# Patient Record
Sex: Male | Born: 1960 | Hispanic: No | Marital: Married | Smoking: Never smoker
Health system: Southern US, Community
[De-identification: ages and names within clinical notes are randomized; demographics above are authoritative.]

## PROBLEM LIST (undated history)

## (undated) DIAGNOSIS — G629 Polyneuropathy, unspecified: Secondary | ICD-10-CM

## (undated) DIAGNOSIS — M48061 Spinal stenosis, lumbar region without neurogenic claudication: Secondary | ICD-10-CM

## (undated) DIAGNOSIS — E119 Type 2 diabetes mellitus without complications: Secondary | ICD-10-CM

## (undated) DIAGNOSIS — K649 Unspecified hemorrhoids: Secondary | ICD-10-CM

## (undated) DIAGNOSIS — G459 Transient cerebral ischemic attack, unspecified: Secondary | ICD-10-CM

## (undated) DIAGNOSIS — I739 Peripheral vascular disease, unspecified: Secondary | ICD-10-CM

## (undated) DIAGNOSIS — B353 Tinea pedis: Secondary | ICD-10-CM

## (undated) DIAGNOSIS — I2510 Atherosclerotic heart disease of native coronary artery without angina pectoris: Secondary | ICD-10-CM

## (undated) DIAGNOSIS — D649 Anemia, unspecified: Secondary | ICD-10-CM

## (undated) DIAGNOSIS — E785 Hyperlipidemia, unspecified: Secondary | ICD-10-CM

## (undated) DIAGNOSIS — I10 Essential (primary) hypertension: Secondary | ICD-10-CM

## (undated) HISTORY — DX: Anemia, unspecified: D64.9

## (undated) HISTORY — DX: Unspecified hemorrhoids: K64.9

## (undated) HISTORY — DX: Hyperlipidemia, unspecified: E78.5

## (undated) HISTORY — DX: Type 2 diabetes mellitus without complications: E11.9

## (undated) HISTORY — DX: Essential (primary) hypertension: I10

## (undated) HISTORY — DX: Transient cerebral ischemic attack, unspecified: G45.9

## (undated) HISTORY — PX: EYE SURGERY: SHX253

## (undated) HISTORY — DX: Spinal stenosis, lumbar region without neurogenic claudication: M48.061

## (undated) HISTORY — DX: Peripheral vascular disease, unspecified: I73.9

## (undated) HISTORY — DX: Tinea pedis: B35.3

## (undated) HISTORY — PX: CARDIAC CATHETERIZATION: SHX172

## (undated) HISTORY — DX: Polyneuropathy, unspecified: G62.9

---

## 2004-07-12 ENCOUNTER — Emergency Department: Payer: Self-pay | Admitting: Emergency Medicine

## 2004-07-29 ENCOUNTER — Ambulatory Visit: Payer: Self-pay | Admitting: General Surgery

## 2005-07-27 ENCOUNTER — Ambulatory Visit (HOSPITAL_BASED_OUTPATIENT_CLINIC_OR_DEPARTMENT_OTHER): Admission: RE | Admit: 2005-07-27 | Discharge: 2005-07-27 | Payer: Self-pay | Admitting: Surgery

## 2005-07-27 ENCOUNTER — Ambulatory Visit (HOSPITAL_COMMUNITY): Admission: RE | Admit: 2005-07-27 | Discharge: 2005-07-27 | Payer: Self-pay | Admitting: Surgery

## 2005-10-11 HISTORY — PX: HERNIA REPAIR: SHX51

## 2008-04-03 DIAGNOSIS — E119 Type 2 diabetes mellitus without complications: Secondary | ICD-10-CM

## 2008-04-03 HISTORY — DX: Type 2 diabetes mellitus without complications: E11.9

## 2008-10-11 HISTORY — PX: OTHER SURGICAL HISTORY: SHX169

## 2009-01-17 DIAGNOSIS — M48061 Spinal stenosis, lumbar region without neurogenic claudication: Secondary | ICD-10-CM | POA: Insufficient documentation

## 2009-01-17 HISTORY — DX: Spinal stenosis, lumbar region without neurogenic claudication: M48.061

## 2013-01-16 DIAGNOSIS — I739 Peripheral vascular disease, unspecified: Secondary | ICD-10-CM | POA: Insufficient documentation

## 2013-01-16 HISTORY — DX: Peripheral vascular disease, unspecified: I73.9

## 2013-03-09 ENCOUNTER — Encounter: Payer: Self-pay | Admitting: Vascular Surgery

## 2013-03-12 ENCOUNTER — Other Ambulatory Visit: Payer: Self-pay | Admitting: *Deleted

## 2013-03-12 DIAGNOSIS — I739 Peripheral vascular disease, unspecified: Secondary | ICD-10-CM

## 2013-03-20 ENCOUNTER — Encounter: Payer: Self-pay | Admitting: Vascular Surgery

## 2013-04-16 ENCOUNTER — Encounter: Payer: Self-pay | Admitting: Vascular Surgery

## 2013-04-17 ENCOUNTER — Encounter: Payer: Self-pay | Admitting: Vascular Surgery

## 2013-04-17 ENCOUNTER — Ambulatory Visit (INDEPENDENT_AMBULATORY_CARE_PROVIDER_SITE_OTHER): Payer: Self-pay | Admitting: Vascular Surgery

## 2013-04-17 ENCOUNTER — Encounter (INDEPENDENT_AMBULATORY_CARE_PROVIDER_SITE_OTHER): Payer: Self-pay | Admitting: Vascular Surgery

## 2013-04-17 VITALS — BP 159/90 | HR 54 | Ht 64.0 in | Wt 217.7 lb

## 2013-04-17 DIAGNOSIS — M25569 Pain in unspecified knee: Secondary | ICD-10-CM

## 2013-04-17 DIAGNOSIS — I739 Peripheral vascular disease, unspecified: Secondary | ICD-10-CM

## 2013-04-17 NOTE — Progress Notes (Signed)
Subjective:     Patient ID: Shane Preston, male   DOB: [birth year], 52 y.o.   MRN: [id number]  HPI this 52 year old male was referred by Dr. Darreld Mclean for evaluation of vascular claudication in legs. Patient states for the past 6 months he has had weakness in his legs with walking. This happens after about one to 2 blocks. He does have numbness and tingling in his feet chronically he states. He has had back surgery in the past. He has no history of nonhealing ulcers infection gangrene or cellulitis. He is able to ambulate many miles if he stops and rests intermittently.  Past Medical History  Diagnosis Date  . Diabetes mellitus without complication   . Hypertension   . Hyperlipidemia   . Lumbar stenosis   . Peripheral vascular disease   . Hemorrhoids   . Tinea pedis     History  Substance Use Topics  . Smoking status: Former Smoker    Types: Cigarettes    Quit date: 04/17/1994  . Smokeless tobacco: Never Used  . Alcohol Use: No    No family history on file.  Allergies not on file  Current outpatient prescriptions:aspirin 81 MG tablet, Take 81 mg by mouth daily., Disp: , Rfl: ;  atorvastatin (LIPITOR) 20 MG tablet, Take 20 mg by mouth daily., Disp: , Rfl: ;  gabapentin (NEURONTIN) 300 MG capsule, Take 300 mg by mouth 3 (three) times daily., Disp: , Rfl: ;  glucose blood test strip, 2 each by Other route daily. Use as instructed, Disp: , Rfl:  hydrochlorothiazide (HYDRODIURIL) 25 MG tablet, Take 25 mg by mouth daily., Disp: , Rfl: ;  insulin glargine (LANTUS) 100 UNIT/ML injection, Inject 20 Units into the skin at bedtime. Titrate 2 units every 3 days until fasting morning glucose reading stays between 100 and 120, Disp: , Rfl: ;  metFORMIN (GLUCOPHAGE) 1000 MG tablet, Take 1,000 mg by mouth 2 (two) times daily with a meal., Disp: , Rfl:  nitroGLYCERIN (NITROSTAT) 0.4 MG SL tablet, Place 0.4 mg under the tongue every 5 (five) minutes as needed for chest pain., Disp: , Rfl: ;  quinapril  (ACCUPRIL) 20 MG tablet, Take 20 mg by mouth daily., Disp: , Rfl: ;  sitaGLIPtin (JANUVIA) 50 MG tablet, Take 50 mg by mouth daily., Disp: , Rfl: ;  terbinafine (LAMISIL) 250 MG tablet, Take 250 mg by mouth daily., Disp: , Rfl:   BP 159/90  Pulse 54  Ht 5\' 4"  (1.626 m)  Wt 217 lb 11.2 oz (98.748 kg)  BMI 37.35 kg/m2  SpO2 99%  Body mass index is 37.35 kg/(m^2).          Review of Systems patient complains of chest pain, leg pain with walking, pain free lying flat, weakness in the arms and legs, numbness in feet, blood in stool in the past. Hematuria in the past. All other systems negative complete review of systems    Objective:   Physical Exam blood pressure 159/90 heart rate 64 respirations 16 Gen.-alert and oriented x3 in no apparent distress HEENT normal for age Lungs no rhonchi or wheezing Cardiovascular regular rhythm no murmurs carotid pulses 3+ palpable no bruits audible Abdomen soft nontender no palpable masses Musculoskeletal free of  major deformities Skin clear -no rashes Neurologic normal Lower extremities 3+ femoral and dorsalis pedis pulses palpable bilaterally with no edema  Data ordered lower extremity arterial Doppler show reviewed and interpreted. They're totally normal with triphasic flow bilaterally and ABIs 1.0 or greater.  Assessment:     Bilateral calf discomfort with ambulation possible neurogenic claudication due to lumbar stenosis No evidence of arterial insufficiency    Plan:     Patient may need referral to neurosurgeon to see if spinal stenosis is causing his lower extremity discomfort with ambulation. No need for any further vascular evaluation or treatment

## 2014-01-10 ENCOUNTER — Observation Stay: Payer: Self-pay | Admitting: Internal Medicine

## 2014-01-10 DIAGNOSIS — I10 Essential (primary) hypertension: Secondary | ICD-10-CM

## 2014-01-10 DIAGNOSIS — E785 Hyperlipidemia, unspecified: Secondary | ICD-10-CM

## 2014-01-10 DIAGNOSIS — R079 Chest pain, unspecified: Secondary | ICD-10-CM

## 2014-01-10 LAB — CBC
HCT: 39.9 % — AB (ref 40.0–52.0)
HGB: 13.3 g/dL (ref 13.0–18.0)
MCH: 28.6 pg (ref 26.0–34.0)
MCHC: 33.3 g/dL (ref 32.0–36.0)
MCV: 86 fL (ref 80–100)
PLATELETS: 328 10*3/uL (ref 150–440)
RBC: 4.65 10*6/uL (ref 4.40–5.90)
RDW: 12.8 % (ref 11.5–14.5)
WBC: 8.1 10*3/uL (ref 3.8–10.6)

## 2014-01-10 LAB — COMPREHENSIVE METABOLIC PANEL
ALBUMIN: 3.7 g/dL (ref 3.4–5.0)
ALK PHOS: 98 U/L
ANION GAP: 5 — AB (ref 7–16)
AST: 27 U/L (ref 15–37)
BUN: 9 mg/dL (ref 7–18)
Bilirubin,Total: 0.4 mg/dL (ref 0.2–1.0)
CALCIUM: 8.5 mg/dL (ref 8.5–10.1)
CHLORIDE: 101 mmol/L (ref 98–107)
Co2: 28 mmol/L (ref 21–32)
Creatinine: 0.82 mg/dL (ref 0.60–1.30)
EGFR (Non-African Amer.): >60
Glucose: 161 mg/dL — ABNORMAL HIGH (ref 65–99)
OSMOLALITY: 270 (ref 275–301)
POTASSIUM: 3.5 mmol/L (ref 3.5–5.1)
SGPT (ALT): 42 U/L (ref 12–78)
Sodium: 134 mmol/L — ABNORMAL LOW (ref 136–145)
Total Protein: 7.9 g/dL (ref 6.4–8.2)

## 2014-01-10 LAB — TROPONIN I
Troponin-I: <0.02 ng/mL
Troponin-I: <0.02 ng/mL
Troponin-I: <0.02 ng/mL

## 2014-01-31 ENCOUNTER — Telehealth: Payer: Self-pay

## 2014-01-31 NOTE — Telephone Encounter (Signed)
Opened in error

## 2014-02-06 ENCOUNTER — Ambulatory Visit (INDEPENDENT_AMBULATORY_CARE_PROVIDER_SITE_OTHER): Payer: PRIVATE HEALTH INSURANCE | Admitting: Cardiology

## 2014-02-06 ENCOUNTER — Encounter: Payer: Self-pay | Admitting: Cardiology

## 2014-02-06 ENCOUNTER — Telehealth: Payer: Self-pay

## 2014-02-06 VITALS — BP 148/98 | HR 59 | Ht 64.0 in | Wt 222.5 lb

## 2014-02-06 DIAGNOSIS — Z794 Long term (current) use of insulin: Secondary | ICD-10-CM

## 2014-02-06 DIAGNOSIS — IMO0001 Reserved for inherently not codable concepts without codable children: Secondary | ICD-10-CM

## 2014-02-06 DIAGNOSIS — E669 Obesity, unspecified: Secondary | ICD-10-CM | POA: Insufficient documentation

## 2014-02-06 DIAGNOSIS — Z0389 Encounter for observation for other suspected diseases and conditions ruled out: Secondary | ICD-10-CM

## 2014-02-06 DIAGNOSIS — R079 Chest pain, unspecified: Secondary | ICD-10-CM

## 2014-02-06 DIAGNOSIS — E119 Type 2 diabetes mellitus without complications: Secondary | ICD-10-CM

## 2014-02-06 DIAGNOSIS — I10 Essential (primary) hypertension: Secondary | ICD-10-CM

## 2014-02-06 HISTORY — DX: Reserved for inherently not codable concepts without codable children: IMO0001

## 2014-02-06 NOTE — Assessment & Plan Note (Signed)
Atypical pain

## 2014-02-06 NOTE — Progress Notes (Signed)
02/06/2014 Shane Preston   October 13, 1960  [id number]  Primary Physicia Leanna SatoMILES,LINDA M, MD Primary Cardiologist: Dr Mariah MillingGollan  HPI:  Shane Shane BaldJacobo is a 53 y/o diabetic male originally from [place] .He has a history of [facility] in Feb [facility] which was completley normal. [facility] with chest pain 4/2-01/11/14. He underwent Lexiscan Myoview which was negative for ischemia. He was put on a PPI, Imdur, and Metropolol. He is here now for follow up. nt thoue continues to complain of very localized, "sharp like a knife" Lt chest pain. It seems fairly constant though he admits NTG occasionally relieves his symptoms. In general he says he feels no different with his new medications.    Current Outpatient Prescriptions  Medication Sig Dispense Refill  . aspirin 81 MG tablet Take 81 mg by mouth daily.      . CRESTOR 10 MG tablet Take 10 mg by mouth daily.       Marland Kitchen. gabapentin (NEURONTIN) 300 MG capsule Take 300 mg by mouth 3 (three) times daily.      Marland Kitchen. glucose blood test strip 2 each by Other route daily. Use as instructed      . insulin glargine (LANTUS) 100 UNIT/ML injection Inject 30 Units into the skin at bedtime. Titrate 2 units every 3 days until fasting morning glucose reading stays between 100 and 120      . isosorbide mononitrate (IMDUR) 30 MG 24 hr tablet Take 30 mg by mouth daily.       Marland Kitchen. lisinopril-hydrochlorothiazide (PRINZIDE,ZESTORETIC) 20-25 MG per tablet Take 1 tablet by mouth daily.       . metFORMIN (GLUCOPHAGE) 1000 MG tablet Take 1,000 mg by mouth 2 (two) times daily with a meal.      . metoprolol succinate (TOPROL-XL) 25 MG 24 hr tablet Take 25 mg by mouth daily.       . nitroGLYCERIN (NITROSTAT) 0.4 MG SL tablet Place 0.4 mg under the tongue every 5 (five) minutes as needed for chest pain.      . pantoprazole (PROTONIX) 40 MG tablet Take 40 mg by mouth daily.       . sitaGLIPtin (JANUVIA) 50 MG tablet Take 50 mg by mouth daily.       No current  facility-administered medications for this visit.    No Known Allergies  History   Social History  . Marital Status: Married    Spouse Name: N/A    Number of Children: N/A  . Years of Education: N/A   Occupational History  . Not on file.   Social History Main Topics  . Smoking status: Former Smoker -- 0.25 packs/day for 4 years    Types: Cigarettes    Quit date: 04/17/1994  . Smokeless tobacco: Never Used  . Alcohol Use: No  . Drug Use: No  . Sexual Activity: Not on file   Other Topics Concern  . Not on file   Social History Narrative  . No narrative on file     Review of Systems: General: negative for chills, fever, night sweats or weight changes.  Cardiovascular: negative for chest pain, dyspnea on exertion, edema, orthopnea, palpitations, paroxysmal nocturnal dyspnea or shortness of breath Dermatological: negative for rash Respiratory: negative for cough or wheezing Urologic: negative for hematuria Abdominal: negative for nausea, vomiting, diarrhea, bright red blood per rectum, melena, or hematemesis Neurologic: negative for visual changes, syncope, or dizziness All other systems reviewed and are otherwise negative except as noted  above.    Blood pressure 148/98, pulse 59, height 5\' 4"  (1.626 m), weight 222 lb 8 oz (100.925 kg).  General appearance: alert, cooperative, no distress and moderately obese Neck: no carotid bruit and no JVD Lungs: clear to auscultation bilaterally Heart: regular rate and rhythm  EKG NSR, SB  ASSESSMENT AND PLAN:   Chest pain- hx of Nl cors 2013, negative Myoview 4/15 Atypical pain  Normal coronary arteries- Feb 2012 These records are available in "Care Everywhere" through EPIC  HTN (hypertension) .  IDDM (insulin dependent diabetes mellitus) .    PLAN  I reviewed Shane Preston's cath with Dr Mariah MillingGollan. His coronaries were totally normal in Feb 2012. His recent Myoview was negative for ischemia. He does have risk factors for  CAD but the chest pain he describes to me is atypical for angina. We plan no further cardiac work up at this time. We will refer him back to his primary care provider. We will be glad to see him again as needed.   Eda PaschalLuke K [facility] CenterKilroyPA-C 02/06/2014 3:35 PM

## 2014-02-06 NOTE — Assessment & Plan Note (Signed)
These records are available [facility]

## 2014-02-06 NOTE — Patient Instructions (Signed)
Continue your current medications, office will call you with follow up instructions.

## 2014-02-06 NOTE — Telephone Encounter (Signed)
l mom to schedule f/u appt 

## 2014-02-08 ENCOUNTER — Telehealth: Payer: Self-pay | Admitting: *Deleted

## 2014-02-08 NOTE — Telephone Encounter (Signed)
Called patient to inform him that per Corine ShelterLuke Kilroy PA "After reviewing his cardiac cath and stress test, his pain does not appear to be cardiac. Please follow up with PCP." Patient verbalized understanding

## 2014-07-12 DIAGNOSIS — Z1211 Encounter for screening for malignant neoplasm of colon: Secondary | ICD-10-CM | POA: Insufficient documentation

## 2014-07-12 HISTORY — DX: Encounter for screening for malignant neoplasm of colon: Z12.11

## 2014-11-23 ENCOUNTER — Emergency Department (HOSPITAL_COMMUNITY)
Admission: EM | Admit: 2014-11-23 | Discharge: 2014-11-23 | Disposition: A | Discharge status: Home / Self Care | Payer: Commercial Managed Care - HMO | Attending: Emergency Medicine | Admitting: Emergency Medicine

## 2014-11-23 DIAGNOSIS — Z8673 Personal history of transient ischemic attack (TIA), and cerebral infarction without residual deficits: Secondary | ICD-10-CM | POA: Insufficient documentation

## 2014-11-23 DIAGNOSIS — Z7982 Long term (current) use of aspirin: Secondary | ICD-10-CM | POA: Diagnosis not present

## 2014-11-23 DIAGNOSIS — Z9889 Other specified postprocedural states: Secondary | ICD-10-CM | POA: Diagnosis not present

## 2014-11-23 DIAGNOSIS — Z79899 Other long term (current) drug therapy: Secondary | ICD-10-CM | POA: Diagnosis not present

## 2014-11-23 DIAGNOSIS — M25522 Pain in left elbow: Secondary | ICD-10-CM | POA: Diagnosis present

## 2014-11-23 DIAGNOSIS — Z8739 Personal history of other diseases of the musculoskeletal system and connective tissue: Secondary | ICD-10-CM | POA: Insufficient documentation

## 2014-11-23 DIAGNOSIS — E1165 Type 2 diabetes mellitus with hyperglycemia: Secondary | ICD-10-CM | POA: Diagnosis not present

## 2014-11-23 DIAGNOSIS — E785 Hyperlipidemia, unspecified: Secondary | ICD-10-CM | POA: Insufficient documentation

## 2014-11-23 DIAGNOSIS — R739 Hyperglycemia, unspecified: Secondary | ICD-10-CM

## 2014-11-23 DIAGNOSIS — Z794 Long term (current) use of insulin: Secondary | ICD-10-CM | POA: Insufficient documentation

## 2014-11-23 DIAGNOSIS — Z8619 Personal history of other infectious and parasitic diseases: Secondary | ICD-10-CM | POA: Insufficient documentation

## 2014-11-23 DIAGNOSIS — G629 Polyneuropathy, unspecified: Secondary | ICD-10-CM | POA: Diagnosis not present

## 2014-11-23 DIAGNOSIS — Z87891 Personal history of nicotine dependence: Secondary | ICD-10-CM | POA: Diagnosis not present

## 2014-11-23 DIAGNOSIS — I10 Essential (primary) hypertension: Secondary | ICD-10-CM | POA: Insufficient documentation

## 2014-11-23 DIAGNOSIS — Y9389 Activity, other specified: Secondary | ICD-10-CM | POA: Diagnosis not present

## 2014-11-23 DIAGNOSIS — M7022 Olecranon bursitis, left elbow: Secondary | ICD-10-CM | POA: Diagnosis not present

## 2014-11-23 DIAGNOSIS — Z8719 Personal history of other diseases of the digestive system: Secondary | ICD-10-CM | POA: Diagnosis not present

## 2014-11-23 LAB — URINALYSIS, ROUTINE W REFLEX MICROSCOPIC
BILIRUBIN URINE: NEGATIVE
HGB URINE DIPSTICK: NEGATIVE
KETONES UR: NEGATIVE mg/dL
LEUKOCYTES UA: NEGATIVE
Nitrite: NEGATIVE
Protein, ur: NEGATIVE mg/dL
SPECIFIC GRAVITY, URINE: 1.03 (ref 1.005–1.030)
Urobilinogen, UA: 0.2 mg/dL (ref 0.0–1.0)
pH: 6.5 (ref 5.0–8.0)

## 2014-11-23 LAB — CBC WITH DIFFERENTIAL/PLATELET
BASOS PCT: 0 % (ref 0–1)
Basophils Absolute: 0 10*3/uL (ref 0.0–0.1)
EOS PCT: 1 % (ref 0–5)
Eosinophils Absolute: 0.1 10*3/uL (ref 0.0–0.7)
HCT: 40.8 % (ref 39.0–52.0)
HEMOGLOBIN: 13.5 g/dL (ref 13.0–17.0)
LYMPHS PCT: 20 % (ref 12–46)
Lymphs Abs: 1.5 10*3/uL (ref 0.7–4.0)
MCH: 28.2 pg (ref 26.0–34.0)
MCHC: 33.1 g/dL (ref 30.0–36.0)
MCV: 85.2 fL (ref 78.0–100.0)
Monocytes Absolute: 0.4 10*3/uL (ref 0.1–1.0)
Monocytes Relative: 6 % (ref 3–12)
NEUTROS ABS: 5.7 10*3/uL (ref 1.7–7.7)
NEUTROS PCT: 73 % (ref 43–77)
Platelets: 382 10*3/uL (ref 150–400)
RBC: 4.79 MIL/uL (ref 4.22–5.81)
RDW: 12.5 % (ref 11.5–15.5)
WBC: 7.7 10*3/uL (ref 4.0–10.5)

## 2014-11-23 LAB — CBG MONITORING, ED
GLUCOSE-CAPILLARY: 512 mg/dL — AB (ref 70–99)
Glucose-Capillary: 598 mg/dL (ref 70–99)
Glucose-Capillary: 581 mg/dL (ref 70–99)
Glucose-Capillary: 265 mg/dL — ABNORMAL HIGH (ref 70–99)

## 2014-11-23 LAB — I-STAT CHEM 8, ED
BUN: 17 mg/dL (ref 6–23)
CREATININE: 1 mg/dL (ref 0.50–1.35)
Calcium, Ion: 1.19 mmol/L (ref 1.12–1.23)
Chloride: 91 mmol/L — ABNORMAL LOW (ref 96–112)
HCT: 45 % (ref 39.0–52.0)
HEMOGLOBIN: 15.3 g/dL (ref 13.0–17.0)
Potassium: 4.9 mmol/L (ref 3.5–5.1)
SODIUM: 131 mmol/L — AB (ref 135–145)
TCO2: 27 mmol/L (ref 0–100)

## 2014-11-23 LAB — BASIC METABOLIC PANEL
Anion gap: 9 (ref 5–15)
BUN: 11 mg/dL (ref 6–23)
CO2: 28 mmol/L (ref 19–32)
CREATININE: 1.19 mg/dL (ref 0.50–1.35)
Calcium: 9.4 mg/dL (ref 8.4–10.5)
Chloride: 93 mmol/L — ABNORMAL LOW (ref 96–112)
GFR calc Af Amer: 78 mL/min — ABNORMAL LOW (ref >90)
GFR calc non Af Amer: 68 mL/min — ABNORMAL LOW (ref >90)
Glucose, Bld: 684 mg/dL (ref 70–99)
Potassium: 5.2 mmol/L — ABNORMAL HIGH (ref 3.5–5.1)
Sodium: 130 mmol/L — ABNORMAL LOW (ref 135–145)

## 2014-11-23 LAB — URINE MICROSCOPIC-ADD ON

## 2014-11-23 LAB — MAGNESIUM: MAGNESIUM: 2 mg/dL (ref 1.5–2.5)

## 2014-11-23 MED ORDER — INSULIN REGULAR BOLUS VIA INFUSION
0.0000 [IU] | Freq: Three times a day (TID) | INTRAVENOUS | Status: DC
  Filled 2014-11-23: qty 10

## 2014-11-23 MED ORDER — SODIUM CHLORIDE 0.9 % IV BOLUS (SEPSIS)
1000.0000 mL | Freq: Once | INTRAVENOUS | Status: AC
  Administered 2014-11-23: 1000 mL via INTRAVENOUS

## 2014-11-23 MED ORDER — SODIUM CHLORIDE 0.9 % IV SOLN: INTRAVENOUS | Status: DC

## 2014-11-23 MED ORDER — INSULIN ASPART 100 UNIT/ML ~~LOC~~ SOLN
10.0000 [IU] | Freq: Once | SUBCUTANEOUS | Status: AC
  Administered 2014-11-23: 10 [IU] via SUBCUTANEOUS
  Filled 2014-11-23: qty 1

## 2014-11-23 MED ORDER — INSULIN DETEMIR 100 UNIT/ML ~~LOC~~ SOLN
30.0000 [IU] | Freq: Once | SUBCUTANEOUS | Status: AC
  Administered 2014-11-23: 30 [IU] via SUBCUTANEOUS
  Filled 2014-11-23 (×2): qty 0.3

## 2014-11-23 MED ORDER — DEXTROSE 50 % IV SOLN: 25.0000 mL | INTRAVENOUS | Status: DC | PRN

## 2014-11-23 MED ORDER — DEXTROSE-NACL 5-0.45 % IV SOLN: INTRAVENOUS | Status: DC

## 2014-11-23 MED ORDER — HYDROCODONE-ACETAMINOPHEN 5-325 MG PO TABS
1.0000 | ORAL_TABLET | Freq: Once | ORAL | Status: AC
  Administered 2014-11-23: 1 via ORAL
  Filled 2014-11-23: qty 1

## 2014-11-23 MED ORDER — KETOROLAC TROMETHAMINE 30 MG/ML IJ SOLN
30.0000 mg | Freq: Once | INTRAMUSCULAR | Status: AC
  Administered 2014-11-23: 30 mg via INTRAVENOUS
  Filled 2014-11-23: qty 1

## 2014-11-23 MED ORDER — SODIUM CHLORIDE 0.9 % IV SOLN
INTRAVENOUS | Status: DC
  Filled 2014-11-23: qty 2.5

## 2014-11-23 MED ORDER — NAPROXEN 500 MG PO TABS: 500.0000 mg | ORAL_TABLET | Freq: Two times a day (BID) | ORAL | Status: DC

## 2014-11-23 MED ORDER — INSULIN ASPART 100 UNIT/ML ~~LOC~~ SOLN
8.0000 [IU] | Freq: Once | SUBCUTANEOUS | Status: AC
  Administered 2014-11-23: 8 [IU] via SUBCUTANEOUS
  Filled 2014-11-23: qty 1

## 2014-11-23 NOTE — ED Notes (Signed)
BS 581.  Will notify nurse when able to find them.

## 2014-11-23 NOTE — ED Provider Notes (Signed)
Pt visit shared.  Pt here with two weeks of left elbow swelling.  Exam c/w bursitis.  No evidence of acute infectious process.  CBG markedly elevated.  Pt takes metformin 1000 mgbid, levemir 30 units daily at 430pm, januvia once daily at noon. No missed medication doses.     Tilden FossaElizabeth Madelena Maturin, MD 11/23/14 [phone]

## 2014-11-23 NOTE — ED Notes (Signed)
CBG 598 

## 2014-11-23 NOTE — Discharge Instructions (Signed)
Bursitis Bursitis is a swelling and soreness (inflammation) of a fluid-filled sac (bursa) that overlies and protects a joint. It can be caused by injury, overuse of the joint, arthritis or infection. The joints most likely to be affected are the elbows, shoulders, hips and knees. HOME CARE INSTRUCTIONS   Apply ice to the affected area for 15-20 minutes each hour while awake for 2 days. Put the ice in a plastic bag and place a towel between the bag of ice and your skin.  Rest the injured joint as much as possible, but continue to put the joint through a full range of motion, 4 times per day. (The shoulder joint especially becomes rapidly "frozen" if not used.) When the pain lessens, begin normal slow movements and usual activities.  Only take over-the-counter or prescription medicines for pain, discomfort or fever as directed by your caregiver.  Your caregiver may recommend draining the bursa and injecting medicine into the bursa. This may help the healing process.  Follow all instructions for follow-up with your caregiver. This includes any orthopedic referrals, physical therapy and rehabilitation. Any delay in obtaining necessary care could result in a delay or failure of the bursitis to heal and chronic pain. SEEK IMMEDIATE MEDICAL CARE IF:   Your pain increases even during treatment.  You develop an oral temperature above 102 F (38.9 C) and have heat and inflammation over the involved bursa. MAKE SURE YOU:   Understand these instructions.  Will watch your condition.  Will get help right away if you are not doing well or get worse. Document Released: 09/24/2000 Document Revised: 12/20/2011 Document Reviewed: 12/17/2013 [facility] Patient Information 2015 [place]. This information is not intended to replace advice given to you by your health care provider. Make sure you discuss any questions you have with your health care provider.  Hyperglycemia Hyperglycemia occurs when the  glucose (sugar) in your blood is too high. Hyperglycemia can happen for many reasons, but it most often happens to people who do not know they have diabetes or are not managing their diabetes properly.  CAUSES  Whether you have diabetes or not, there are other causes of hyperglycemia. Hyperglycemia can occur when you have diabetes, but it can also occur in other situations that you might not be as aware of, such as: Diabetes  If you have diabetes and are having problems controlling your blood glucose, hyperglycemia could occur because of some of the following reasons:  Not following your meal plan.  Not taking your diabetes medications or not taking it properly.  Exercising less or doing less activity than you normally do.  Being sick. Pre-diabetes  This cannot be ignored. Before people develop Type 2 diabetes, they almost always have "pre-diabetes." This is when your blood glucose levels are higher than normal, but not yet high enough to be diagnosed as diabetes. Research has shown that some long-term damage to the body, especially the heart and circulatory system, may already be occurring during pre-diabetes. If you take action to manage your blood glucose when you have pre-diabetes, you may delay or prevent Type 2 diabetes from developing. Stress  If you have diabetes, you may be "diet" controlled or on oral medications or insulin to control your diabetes. However, you may find that your blood glucose is higher than usual in the hospital whether you have diabetes or not. This is often referred to as "stress hyperglycemia." Stress can elevate your blood glucose. This happens because of hormones put out by the body during  times of stress. If stress has been the cause of your high blood glucose, it can be followed regularly by your caregiver. That way he/she can make sure your hyperglycemia does not continue to get worse or progress to diabetes. Steroids  Steroids are medications that act on  the infection fighting system (immune system) to block inflammation or infection. One side effect can be a rise in blood glucose. Most people can produce enough extra insulin to allow for this rise, but for those who cannot, steroids make blood glucose levels go even higher. It is not unusual for steroid treatments to "uncover" diabetes that is developing. It is not always possible to determine if the hyperglycemia will go away after the steroids are stopped. A special blood test called an A1c is sometimes done to determine if your blood glucose was elevated before the steroids were started. SYMPTOMS  Thirsty.  Frequent urination.  Dry mouth.  Blurred vision.  Tired or fatigue.  Weakness.  Sleepy.  Tingling in feet or leg. DIAGNOSIS  Diagnosis is made by monitoring blood glucose in one or all of the following ways:  A1c test. This is a chemical found in your blood.  Fingerstick blood glucose monitoring.  Laboratory results. TREATMENT  First, knowing the cause of the hyperglycemia is important before the hyperglycemia can be treated. Treatment may include, but is not be limited to:  Education.  Change or adjustment in medications.  Change or adjustment in meal plan.  Treatment for an illness, infection, etc.  More frequent blood glucose monitoring.  Change in exercise plan.  Decreasing or stopping steroids.  Lifestyle changes. HOME CARE INSTRUCTIONS   Test your blood glucose as directed.  Exercise regularly. Your caregiver will give you instructions about exercise. Pre-diabetes or diabetes which comes on with stress is helped by exercising.  Eat wholesome, balanced meals. Eat often and at regular, fixed times. Your caregiver or nutritionist will give you a meal plan to guide your sugar intake.  Being at an ideal weight is important. If needed, losing as little as 10 to 15 pounds may help improve blood glucose levels. SEEK MEDICAL CARE IF:   You have questions  about medicine, activity, or diet.  You continue to have symptoms (problems such as increased thirst, urination, or weight gain). SEEK IMMEDIATE MEDICAL CARE IF:   You are vomiting or have diarrhea.  Your breath smells fruity.  You are breathing faster or slower.  You are very sleepy or incoherent.  You have numbness, tingling, or pain in your feet or hands.  You have chest pain.  Your symptoms get worse even though you have been following your caregiver's orders.  If you have any other questions or concerns. Document Released: 03/23/2001 Document Revised: 12/20/2011 Document Reviewed: 01/24/2012 [facility] Patient Information 2015 [place], [place]. This information is not intended to replace advice given to you by your health care provider. Make sure you discuss any questions you have with your health care provider.

## 2014-11-23 NOTE — ED Notes (Signed)
Seth BakeEmily West-PA notified of elevated glucose on chem-8

## 2014-11-23 NOTE — ED Notes (Signed)
Lt. Elbow swelling that came out of no where two weeks ago,

## 2014-11-23 NOTE — ED Notes (Signed)
  CBG 265  

## 2014-11-23 NOTE — ED Provider Notes (Signed)
CSN: [id number]     Arrival date & time 11/23/14  1340 History  This chart was scribed for non-physician practitioner, Trixie Dredge, PA-C working with Juliet Rude. Rubin Payor, MD by Freida Busman, ED Scribe. This patient was seen in room TR07C/TR07C and the patient's care was started at 2:56 PM.    Chief Complaint  Patient presents with  . Elbow Pain    The history is provided by the patient. No language interpreter was used.     HPI Comments:  Shane Preston is a 54 y.o. male with a h/o DM who presents to the Emergency Department complaining of moderate pain to his right elbow that started 2 weeks ago. His pain has progressively worsened since onset. He reports increased warmth to the area and states pain intermittently shoots down in to the forearm. He denies injury/fall and decreased sensation to the extremity, fever, chills, body aches and abdominal pain. He is right hand dominant. No alleviating factors noted. Pt is complaint with is DM meds but reports high BS yesterday, in the 300s.     Past Medical History  Diagnosis Date  . Diabetes mellitus without complication   . Hypertension   . Hyperlipidemia   . Lumbar stenosis   . Peripheral vascular disease   . Hemorrhoids   . Tinea pedis   . Neuropathy   . TIA (transient ischemic attack)    Past Surgical History  Procedure Laterality Date  . Hernia repair  2007  . Lumbar decompression surgery  2010  . Cardiac catheterization      [facility]    Family History  Problem Relation Age of Onset  . Family history unknown: Yes   History  Substance Use Topics  . Smoking status: Former Smoker -- 0.25 packs/day for 4 years    Types: Cigarettes    Quit date: 04/17/1994  . Smokeless tobacco: Never Used  . Alcohol Use: No    Review of Systems  Constitutional: Negative for fever and chills.  Gastrointestinal: Negative for abdominal pain.  Musculoskeletal: Positive for myalgias and arthralgias.  Neurological: Negative for  numbness.  All other systems reviewed and are negative.     Allergies  Review of patient's allergies indicates no known allergies.  Home Medications   Prior to Admission medications   Medication Sig Start Date End Date Taking? Authorizing Provider  aspirin 81 MG tablet Take 81 mg by mouth daily.    Historical Provider, MD  CRESTOR 10 MG tablet Take 10 mg by mouth daily.  01/10/14   Historical Provider, MD  gabapentin (NEURONTIN) 300 MG capsule Take 300 mg by mouth 3 (three) times daily.    Historical Provider, MD  glucose blood test strip 2 each by Other route daily. Use as instructed    Historical Provider, MD  insulin glargine (LANTUS) 100 UNIT/ML injection Inject 30 Units into the skin at bedtime. Titrate 2 units every 3 days until fasting morning glucose reading stays between 100 and 120    Historical Provider, MD  isosorbide mononitrate (IMDUR) 30 MG 24 hr tablet Take 30 mg by mouth daily.  01/14/14   Historical Provider, MD  lisinopril-hydrochlorothiazide (PRINZIDE,ZESTORETIC) 20-25 MG per tablet Take 1 tablet by mouth daily.  01/10/14   Historical Provider, MD  metFORMIN (GLUCOPHAGE) 1000 MG tablet Take 1,000 mg by mouth 2 (two) times daily with a meal.    Historical Provider, MD  metoprolol succinate (TOPROL-XL) 25 MG 24 hr tablet Take 25 mg by mouth daily.  01/14/14  Historical Provider, MD  nitroGLYCERIN (NITROSTAT) 0.4 MG SL tablet Place 0.4 mg under the tongue every 5 (five) minutes as needed for chest pain.    Historical Provider, MD  pantoprazole (PROTONIX) 40 MG tablet Take 40 mg by mouth daily.  01/14/14   Historical Provider, MD  sitaGLIPtin (JANUVIA) 50 MG tablet Take 50 mg by mouth daily.    Historical Provider, MD   BP 150/94 mmHg  Pulse 83  Temp(Src) 97.7 F (36.5 C) (Oral)  Resp 16  SpO2 96% Physical Exam  Constitutional: He appears well-developed and well-nourished. No distress.  HENT:  Head: Normocephalic and atraumatic.  Neck: Neck supple.  Cardiovascular: Normal  rate and regular rhythm.   Pulmonary/Chest: Effort normal and breath sounds normal. No respiratory distress. He has no wheezes. He has no rales.  Abdominal: Soft. He exhibits no distension and no mass. There is no tenderness. There is no rebound and no guarding.  Musculoskeletal:  Tenderness and fullness of left olecranon bursa with mild warmth.  No erythema or fluctuance.  Full AROM of left elbow without pain.  Distal pulses and sensation intact.  No edema of left arm.    Neurological: He is alert. He exhibits normal muscle tone.  Skin: He is not diaphoretic.  Nursing note and vitals reviewed.   ED Course  Procedures   DIAGNOSTIC STUDIES:  Oxygen Saturation is 96% on RA, normal by my interpretation.    COORDINATION OF CARE:  3:21 PM Will order CBG, antibiotics and pain meds. Discussed treatment plan with pt at bedside and pt agreed to plan.  Labs Review Labs Reviewed  CBG MONITORING, ED - Abnormal; Notable for the following:    Glucose-Capillary 598 (*)    All other components within normal limits  I-STAT CHEM 8, ED - Abnormal; Notable for the following:    Sodium 131 (*)    Chloride 91 (*)    Glucose, Bld >700 (*)    All other components within normal limits  CBC WITH DIFFERENTIAL/PLATELET  URINALYSIS, ROUTINE W REFLEX MICROSCOPIC  BASIC METABOLIC PANEL  MAGNESIUM    Imaging Review No results found.   EKG Interpretation None      MDM   Final diagnoses:  Olecranon bursitis of left elbow  Hyperglycemia    Afebrile nontoxic patient with left olecranon bursitis.  No fevers or systemic symptoms.  No injury.  Ongoing x 2 weeks.  Plan for pain medications and orthopedic follow up.  CBG found to be 598.  Pt in fast track, plan to move to main ED for further evaluation and management of hyperglycemia.  Labs, UA, IVF, glucose stabilizer ordered.  Signed out to Dr Madilyn Hookees.    I personally performed the services described in this documentation, which was scribed in my  presence. The recorded information has been reviewed and is accurate.    Trixie Dredgemily Xylina Rhoads, PA-C 11/23/14 1616  Tilden FossaElizabeth Rees, MD 11/23/14 [phone]

## 2015-02-01 NOTE — H&P (Signed)
PATIENT NAME:  Shane Preston, Shane Preston MR#:  [id number] DATE OF BIRTH:  11-Mar-1961  DATE OF ADMISSION:  01/10/2014  PRIMARY CARE PROVIDER: [facility].   CHIEF COMPLAINT: Left-sided chest pain.   HISTORY OF PRESENT ILLNESS: A 54 year old male patient with history of hypertension, diabetes and remote tobacco abuse, presents to the Emergency Room sent in from primary care physician's office after he complained of worsening chest pain for the last 4 days. He mentioned that his chest pain is located in the left precordial area. It can happen at rest or with exertion. He has had this similar pain for multiple years, had a cardiac catheterization 2 years back for the same pain, which is normal per patient. Records not available at this time. This pain has worsened over the last 4 days and tends to improve with nitroglycerin. There is no relation to food and is not pleuritic. He does not have a cough. No shortness of breath, palpitations, or lightheadedness. He does have diaphoresis on and off. No recent change in medications. He is on aspirin, statin and also, his blood pressure has been elevated today at his primary care physician's office and also in the Emergency Room up to 174/100 on arrival to the ER.   PAST MEDICAL HISTORY:  1.  Insulin-dependent diabetes mellitus.  2.  Hypertension.  3.  Remote tobacco abuse.  4.  Neuropathy.   FAMILY HISTORY: No premature CAD in the family.   SOCIAL HISTORY: The patient has retired from work. He smoked in the past, but quit decades back. Quit alcohol. No illicit drugs. Lives at home with his wife.   CODE STATUS: Full code.   REVIEW OF SYSTEMS:  GENERAL: No fever, weakness, weight loss, weight gain.  EYES: No blurred vision, pain.  ENT: No tinnitus, ear pain, hearing loss.   RESPIRATORY: No cough, wheeze, hemoptysis.  CARDIOVASCULAR: Has chest pain.  GASTROINTESTINAL: No nausea, vomiting, diarrhea, abdominal pain.  GENITOURINARY:  Denies dysuria, hematuria or  frequency.   ENDOCRINE: No polyuria, nocturia, thyroid problems.  HEMATOLOGIC AND LYMPHATIC: No anemia, easy bruising, bleeding.  INTEGUMENTARY: No acne, rash, lesion.  MUSCULOSKELETAL: No back pain, arthritis.  NEUROLOGIC: No focal numbness, weakness, seizure.  PSYCHIATRIC: No anxiety or depression.   HOME MEDICATIONS:  Lantus 30 units subcutaneous at bedtime daily.  Neurontin 300 mg 3 times a day.  Crestor 10 mg daily.  Hydrochlorothiazide and lisinopril 25/20 one tablet daily.  Januvia 50 mg oral once a day.  Metformin 1000 mg oral 2 times a day.  Nitrostat 0.4 sublingual as needed for chest pain.   PHYSICAL EXAMINATION: GENERAL:  Obese Hispanic male patient lying in bed, seems comfortable, conversational, cooperative with exam.  PSYCHIATRIC: Alert and oriented x 3. Mood and affect appropriate. Judgment intact.  HEENT: Normocephalic. Oral mucosa moist and pink. Extraocular movements normal.  No pallor. No icterus. Pupils bilaterally equal and reactive to light.  NECK: Supple. No thyromegaly. No palpable lymph nodes. Trachea midline. No carotid bruit or JVD.  CARDIOVASCULAR: S1, S2, without any murmurs. Peripheral pulses 2+. No edema. No chest wall tenderness.  RESPIRATORY: Normal work of breathing,clear to auscultations   GASTROINTESTINAL: Soft abdomen, nontender. Bowel sounds present. No hepatosplenomegaly palpable.  SKIN: Warm and dry. No petechiae, rash, ulcers.  MUSCULOSKELETAL: No joint swelling, redness, effusion of large joints. Normal muscle tone.  NEUROLOGICAL: Motor strength 5/5 in upper and lower extremities. Sensation is intact all over.   LYMPHATIC: No cervical lymphadenopathy.   LABORATORY STUDIES: Show glucose 161, BUN 9, creatinine  0.89, sodium 134, potassium 3.5, chloride 101, bicarbonate 20, GFR greater than 60. AST, ALT, alkaline phosphatase, bilirubin normal. Troponin less than 0.02. WBC 8.1, hemoglobin 13.3, platelets of 328,000.   EKG shows normal sinus  rhythm. No acute ST-T wave changes.   Chest x-ray shows normal findings. No fractures. No infiltrate or effusion.   ASSESSMENT AND PLAN:  1.  Atypical chest pain in a patient with diabetes, hypertension, but has been going on for years and the patient had a catheterization just 2 years prior, which was normal per patient. We will obtain records from [facility]. We will admit the patient under observation on a telemetry floor, get 2 more sets of cardiac enzymes. This pain is not it pleuritic. It is not related to food. Improves with nitroglycerin. Further management after cardiology input and reviewing the cardiac catheterization report from [facility], which is being obtained at this time.  2.  Insulin-dependent diabetes mellitus. Continue the Lantus 30 units along with metformin and Januvia the patient is on. He will be on a diabetic diet. If patient needs to be n.p.o. at nighttime he will be given 10 units of Lantus instead of 30 and placed these instructions.  3.  Hypertension, uncontrolled. The patient's blood pressure is elevated into the 170s. He is on hydrochlorothiazide and lisinopril. We will add a low dose beta-blocker.  4.  The patient is on aspirin and Crestor.  5.  DVT prophylaxis with Lovenox.   CODE STATUS: Full code.   TIME SPENT: 40 minutes.   ____________________________ Molinda Bailiff Earl Zellmer, MD srs:tc D: 01/10/2014 13:28:56 ET T: 01/10/2014 13:57:35 ET JOB#: [id number]  cc: Wardell Heath R. Jahaad Penado, MD, <Dictator> Orie Fisherman MD ELECTRONICALLY SIGNED 01/10/2014 20:13

## 2015-02-01 NOTE — Consult Note (Signed)
General Aspect Shane Preston is a 54yo Hispanic, English-speaking male w/ PMHx s/f CAD (nonobstructive by patient's account, cath 2012-13 at [facility]), DM2, HTN, HLD and obesity who presents to [facility] ED c/o chest pain.   He reports long-standing left-sided chest pain for the past several years. This actually prompted his cath two years ago at [facility]. Speaking to the patient, cath apparently revealed small plaques, but nothing significant. He did not undergo PCI. He does not follow-up with a cardiologist regularly. He receives primary care at the [facility].   He reports being in his USOH (previously worked in [employer], unemployed/disabled currently due to a back injury, functionally limited) until ~4 days ago. He describes experiencing exertional L sided chest pain qualified as both sharp and squeezing w/ associated diaphoresis and relieved reliably w/ NTG SL x 1. He denies SOB/DOE, n/v, PND, orthopnea, LE edema, sudden weight gain. No palpitations, lightheadedness or syncope. No unilateral leg swelling, redness or tenderness. No change w/ palpation or position changes. He does have indigestion occasionally, but states this pain is clearly different. These episodes have persisted over this time period thus prompting his ED presentation today.   Present Illness . In the ED, EKG revealed an isolated nonspecific ST scooping in III, otherwise no acute ischemic findings. Initial TnI WNL. CMP- Na 134, otherwise WNL. CBC WNL. CXR- no acute process. He was initially hypertensive in the ED- BP 170/100. No evidence of hypoxia, tachypnea or tachycardia. He did receive a full-dose ASA, and NTG SL x 2 in the ED w/ pain relief.  He is currently chest pain free, and in no acute distress.  He has been admitted for overnight observation by the hospitalist team. Records are to be obtained from [facility].  PAST MEDICAL HISTORY Type 2 DM Nonobstructive CAD HTN HLD Obesity Snoring  PAST SURGICAL HISTORY Cardiac  catheterization- 2 years ago at [facility] Unspecified back surgery  FAMILY HISTORY Denies family history of cardiovascular disease.   SOCIAL HISTORY Married. Lives in [place], [place] w/ wife. Denies tobacco, EtOH or illicit drug use.   Physical Exam:  GEN no acute distress, obese   HEENT pink conjunctivae, PERRL, hearing intact to voice   NECK supple  No masses  trachea midline  no evidence of JVD or bruits   RESP normal resp effort  clear BS  no use of accessory muscles   CARD Regular rate and rhythm  Normal, S1, S2  No murmur  no left-sided chest wall tenderness on palpation   ABD denies tenderness  soft  normal BS  central adiposity   EXTR negative cyanosis/clubbing, negative edema   SKIN normal to palpation   NEURO follows commands, motor/sensory function intact   PSYCH alert, A+O to time, place, person   Review of Systems:  Subjective/Chief Complaint chest pain   General: diaphoresis   Skin: No Complaints    ENT: No Complaints    Eyes: No Complaints    Neck: No Complaints    Respiratory: No Complaints    Cardiovascular: Chest pain or discomfort   Gastrointestinal: No Complaints    Genitourinary: No Complaints    Vascular: No Complaints    Musculoskeletal: No Complaints    Neurologic: No Complaints    Hematologic: No Complaints    Endocrine: No Complaints    Psychiatric: No Complaints    Review of Systems: All other systems were reviewed and found to be negative   Medications/Allergies Reviewed Medications/Allergies reviewed    Home Medications: Medication Instructions Status  gabapentin  300 mg oral capsule 1 cap(s) orally 3 times a day Active  metFORMIN 1000 mg oral tablet 1 tab(s) orally 2 times a day Active  insulin detemir 100 units/mL subcutaneous solution 30 unit(s) subcutaneous once a day (at bedtime) flex pin Active  Januvia 50 mg oral tablet 1 tab(s) orally once a day Active  Crestor 10 mg oral tablet 1 tab(s) orally once a day (at  bedtime) Active  Nitrostat 0.4 mg sublingual tablet 1 tab(s) sublingual every 5 minutes, As Needed Active  hydrochlorothiazide-lisinopril 25 mg-20 mg oral tablet 1 tab(s) orally once a day Active   Lab Results:  Hepatic:  02-Apr-15 10:56   Bilirubin, Total 0.4  Alkaline Phosphatase 98 (45-117 NOTE: New Reference Range 08/31/13)  SGPT (ALT) 42  SGOT (AST) 27  Total Protein, Serum 7.9  Albumin, Serum 3.7  Routine Chem:  02-Apr-15 10:56   Glucose, Serum  161  BUN 9  Creatinine (comp) 0.82  Sodium, Serum  134  Potassium, Serum 3.5  Chloride, Serum 101  Calcium (Total), Serum 8.5  Osmolality (calc) 270  eGFR (African American) >60  eGFR (Non-African American) >60 (eGFR values <57m/min/1.73 m2 may be an indication of chronic kidney disease (CKD). Calculated eGFR is useful in patients with stable renal function. The eGFR calculation will not be reliable in acutely ill patients when serum creatinine is changing rapidly. It is not useful in  patients on dialysis. The eGFR calculation may not be applicable to patients at the low and high extremes of body sizes, pregnant women, and vegetarians.)  Anion Gap  5  Cardiac:  02-Apr-15 10:56   Troponin I < 0.02 (0.00-0.05 0.05 ng/mL or less: NEGATIVE  Repeat testing in 3-6 hrs  if clinically indicated. >0.05 ng/mL: POTENTIAL  MYOCARDIAL INJURY. Repeat  testing in 3-6 hrs if  clinically indicated. NOTE: An increase or decrease  of 30% or more on serial  testing suggests a  clinically important change)    14:49   Troponin I < 0.02 (0.00-0.05 0.05 ng/mL or less: NEGATIVE  Repeat testing in 3-6 hrs  if clinically indicated. >0.05 ng/mL: POTENTIAL  MYOCARDIAL INJURY. Repeat  testing in 3-6 hrs if  clinically indicated. NOTE: An increase or decrease  of 30% or more on serial  testing suggests a  clinically important change)  Routine Hem:  02-Apr-15 10:56   WBC (CBC) 8.1  RBC (CBC) 4.65  Hemoglobin (CBC) 13.3  Hematocrit  (CBC)  39.9  Platelet Count (CBC) 328 (Result(s) reported on 10 Jan 2014 at 11:06AM.)  MCV 86  MCH 28.6  MCHC 33.3  RDW 12.8   EKG:  Interpretation EKG shows NSR, isolated ST scooping in III, no ST/T changes elsewhere   Rate 75   EKG Comparision no prior for comparison   Radiology Results: XRay:    02-Apr-15 11:31, Chest PA and Lateral  Chest PA and Lateral   REASON FOR EXAM:    chest pain  COMMENTS:   May transport without cardiac monitor    PROCEDURE: DXR - DXR CHEST PA (OR AP) AND LATERAL  - Jan 10 2014 11:31AM     CLINICAL DATA:  Chest pain    EXAM:  CHEST  2 VIEW    COMPARISON:  None.    FINDINGS:  The heart size and mediastinal contours are within normal limits.  Both lungs are clear. The visualized skeletal structures are  unremarkable.     IMPRESSION:  No active cardiopulmonary disease.      Electronically Signed  By: Franchot Gallo M.D.    On: 01/10/2014 11:57         Verified By: Truett Perna, M.D.,    No Known Allergies:   Vital Signs/Nurse's Notes: **Vital Signs.:   02-Apr-15 14:29  Vital Signs Type Admission  Temperature Temperature (F) 98.2  Celsius 36.7  Temperature Source oral  Pulse Pulse 66  Respirations Respirations 18  Systolic BP Systolic BP 590  Diastolic BP (mmHg) Diastolic BP (mmHg) 87  Mean BP 108  Pulse Ox % Pulse Ox % 96  Pulse Ox Activity Level  At rest  Oxygen Delivery Room Air/ 21 %  *Intake and Output.:   02-Apr-15 14:30  Grand Totals Intake:  0 Output:  0    Net:  0 24 Hr.:  0  Weight Type admission  Weight Method Bed  Current Weight (lbs) (lbs) 219.8  Current Weight (kg) (kg) 99.6  Height (ft) (feet) 5  Height (in) (in) 4  Height (cm) centimeters 162.5  BSA (m2) 2  BMI (kg/m2) 37.7    Impression 54yo Hispanic, English-speaking male w/ PMHx s/f CAD (nonobstructive by patient's account, cath 2012-13 at [facility]), DM2, HTN, HLD and obesity who presents to [facility] ED c/o chest pain.   1. Chest pain, moderate  risk for cardiac etiology 4 days of exertional, L-sided chest pain associated with diaphoresis and relieved w/ NTG SL PRN.   cardiac cath ~2 years ago at [facility] which apparently revealed nonobstructive disease.  Cardiac RFs include DM2, HTN, HLD and obesity.   EKG reveals no evidence of ischemia.  TnI x2 WNL.  Differential diagnoses include angina, coronary vasospasm, esophageal etiologies and diabetic intercostal neuritis (on gabapentin, ? diabetic neuropathy) -- Cycle cardiac biomarkers for formal rule out -- plan for pharmacologic nuclear stress test in AM  -- Review cath report from [facility] -- Continue low-dose ASA, ACEi, BB, statin, NTG SL PRN -- Consider long-acting nitrate +/- CCB if vasospasm/esophageal etiologies suspected -- Risk stratify w/ lipid panel in AM, Hgb A1C -- Check TSH -- Continue PPI, add imdur daily  2. Hypertension HTNive in the ED. Better controlled at present. -- Continue current antihypertensives  3. Hyperlipidemia -- Check lipid panel -- Continue high-potency statin  4. DM2 -- Home oral hypoglycemics continued by primary team -- Check Hgb A1C -- On lisinopril for renal protection  5. Snoring- given obesity and body habitus, suspect occult OSA. -- Recommend outpatient sleep study  6. Obesity- BMI 38 -- Lifestyle modifications as means of weight loss and risk reduction   Electronic Signatures: Zalma Channing A (PA-C)  (Signed 02-Apr-15 15:15)  Authored: General Aspect/Present Illness, History and Physical Exam, Review of System, Home Medications, Labs, EKG , Radiology, Allergies, Vital Signs/Nurse's Notes, Impression/Plan Ida Rogue (MD)  (Signed 02-Apr-15 20:09)  Authored: General Aspect/Present Illness, Review of System, Labs, EKG , Impression/Plan  Co-Signer: General Aspect/Present Illness, History and Physical Exam, Review of System, Home Medications, Labs, EKG , Allergies, Impression/Plan   Last Updated: 02-Apr-15 20:09 by Ida Rogue  (MD)

## 2015-02-01 NOTE — Discharge Summary (Signed)
PATIENT NAME:  Shane Preston, Shane Preston MR#:  [id number] DATE OF BIRTH:  1961-01-20  DATE OF ADMISSION:  01/10/2014  DATE OF DISCHARGE:  01/11/2014  PRIMARY CARE PHYSICIAN: Non-local  CONSULTATIONS:  Dr. Mariah MillingGollan  DISCHARGE DIAGNOSES: 1.  Atypical chest pain. 2.  Hypertension. 3.  Diabetes. 4.  Hyperlipidemia. 5.  Obesity.   CONDITION: Stable.   CODE STATUS: FULL CODE.   HOME MEDICATIONS: Please refer to the medication reconciliation list.   INSTRUCTIONS: Diet: Low-sodium, low-fat, low-cholesterol, ADA diet. Activity as tolerated. Follow up care: Follow up with PCP within 1 to 2 weeks. Follow up Dr. Mariah MillingGollan within 1 to 2 weeks.   REASON FOR ADMISSION: Chest pain.   HOSPITAL COURSE: The patient is a 54 year old male with history of hypertension, diabetes, hyperlipidemia, who presented to the ED with left-sided chest pain for 4 days. The patient's blood pressure was high at 170/100 in the ED. The patient was admitted for observation. For detailed history and physical examination, please refer to the admission note dictated by Dr. Elpidio AnisSudini. Laboratory data on admission date was normal. Troponin has been less than 0.02 for 3 times. EKG showed normal sinus rhythm, without any ST-T wave changes. Chest x-ray shows normal findings. After admission, the patient had been treated with aspirin, Crestor, Lopressor, lisinopril, and nitro p.r.n. Dr. Mariah MillingGollan evaluated patient and suggested a stress test, which is normal today. Dr. Mariah MillingGollan suggested adding Imdur 30 mg p.o. daily, with a PPI.  Hypertension. After admission, patient's blood pressure has been under control with the  abovementioned hypertension medication.   The patient still has mild chest pain, but no evidence of acute coronary syndrome or myocardial infarction. I discussed with Dr. Mariah MillingGollan. The patient will be discharged home today. Discussed the patient's discharge plan with the patient, the patient's wife, nurse, case manager, and Dr. Mariah MillingGollan.    TIME SPENT: About 38 minutes.    ____________________________ Shaune PollackQing Peg Fifer, MD qc:mr D: 01/11/2014 15:21:00 ET T: 01/11/2014 21:52:34 ET JOB#: [id number]  cc: Shaune PollackQing Merrilee Ancona, MD, <Dictator> Shaune PollackQING Cylie Dor MD ELECTRONICALLY SIGNED 01/12/2014 15:12

## 2015-05-09 ENCOUNTER — Encounter (HOSPITAL_COMMUNITY): Payer: Self-pay | Admitting: *Deleted

## 2015-05-09 ENCOUNTER — Emergency Department (HOSPITAL_COMMUNITY)
Admission: EM | Admit: 2015-05-09 | Discharge: 2015-05-09 | Disposition: A | Discharge status: Home / Self Care | Payer: Medicare HMO | Attending: Emergency Medicine | Admitting: Emergency Medicine

## 2015-05-09 DIAGNOSIS — Z87442 Personal history of urinary calculi: Secondary | ICD-10-CM | POA: Diagnosis not present

## 2015-05-09 DIAGNOSIS — R319 Hematuria, unspecified: Secondary | ICD-10-CM | POA: Insufficient documentation

## 2015-05-09 DIAGNOSIS — L988 Other specified disorders of the skin and subcutaneous tissue: Secondary | ICD-10-CM | POA: Diagnosis not present

## 2015-05-09 DIAGNOSIS — Z9889 Other specified postprocedural states: Secondary | ICD-10-CM | POA: Diagnosis not present

## 2015-05-09 DIAGNOSIS — I10 Essential (primary) hypertension: Secondary | ICD-10-CM | POA: Diagnosis not present

## 2015-05-09 DIAGNOSIS — Z8673 Personal history of transient ischemic attack (TIA), and cerebral infarction without residual deficits: Secondary | ICD-10-CM | POA: Diagnosis not present

## 2015-05-09 DIAGNOSIS — E119 Type 2 diabetes mellitus without complications: Secondary | ICD-10-CM | POA: Insufficient documentation

## 2015-05-09 DIAGNOSIS — Z87891 Personal history of nicotine dependence: Secondary | ICD-10-CM | POA: Insufficient documentation

## 2015-05-09 LAB — URINE MICROSCOPIC-ADD ON

## 2015-05-09 LAB — URINALYSIS, ROUTINE W REFLEX MICROSCOPIC
Bilirubin Urine: NEGATIVE
Glucose, UA: >1000 mg/dL — AB
Hgb urine dipstick: NEGATIVE
Ketones, ur: NEGATIVE mg/dL
Leukocytes, UA: NEGATIVE
Nitrite: NEGATIVE
PH: 5 (ref 5.0–8.0)
Protein, ur: NEGATIVE mg/dL
Specific Gravity, Urine: 1.031 — ABNORMAL HIGH (ref 1.005–1.030)
Urobilinogen, UA: 0.2 mg/dL (ref 0.0–1.0)

## 2015-05-09 LAB — BASIC METABOLIC PANEL
Anion gap: 7 (ref 5–15)
BUN: 16 mg/dL (ref 6–20)
CALCIUM: 9.4 mg/dL (ref 8.9–10.3)
CO2: 28 mmol/L (ref 22–32)
CREATININE: 1.14 mg/dL (ref 0.61–1.24)
Chloride: 104 mmol/L (ref 101–111)
GFR calc Af Amer: >60 mL/min (ref >60)
GLUCOSE: 107 mg/dL — AB (ref 65–99)
Potassium: 4.3 mmol/L (ref 3.5–5.1)
SODIUM: 139 mmol/L (ref 135–145)

## 2015-05-09 LAB — CBC WITH DIFFERENTIAL/PLATELET
BASOS PCT: 0 % (ref 0–1)
Basophils Absolute: 0 10*3/uL (ref 0.0–0.1)
EOS ABS: 0.1 10*3/uL (ref 0.0–0.7)
EOS PCT: 1 % (ref 0–5)
HCT: 41.9 % (ref 39.0–52.0)
Hemoglobin: 14 g/dL (ref 13.0–17.0)
Lymphocytes Relative: 29 % (ref 12–46)
Lymphs Abs: 2.4 10*3/uL (ref 0.7–4.0)
MCH: 28.6 pg (ref 26.0–34.0)
MCHC: 33.4 g/dL (ref 30.0–36.0)
MCV: 85.5 fL (ref 78.0–100.0)
MONOS PCT: 7 % (ref 3–12)
Monocytes Absolute: 0.6 10*3/uL (ref 0.1–1.0)
NEUTROS PCT: 63 % (ref 43–77)
Neutro Abs: 5.1 10*3/uL (ref 1.7–7.7)
Platelets: 378 10*3/uL (ref 150–400)
RBC: 4.9 MIL/uL (ref 4.22–5.81)
RDW: 13.8 % (ref 11.5–15.5)
WBC: 8.2 10*3/uL (ref 4.0–10.5)

## 2015-05-09 NOTE — ED Provider Notes (Signed)
History   Chief Complaint  Patient presents with  . Hematuria    HPI 54 year old male with past history of diabetes, hypertension who presents to ED today complaining of painless hematuria for the past couple of days. She says this is very small volume hematuria. He says a few years ago he had similar symptoms which resolved spontaneously. Patient has had a history of kidney stones in the past but says this is nothing like that as he is not having any pain. Patient denies any fevers, chills, dysuria, frequency, penile pain, nausea, vomiting, abdominal pain, back pain. Patient states his blood sugars have been good recently. He is taking all his medications as instructed. He says he tried to get his PCP today but was unable to do so as they were closed. Onset was gradual. No modifying factors. No other complaints at this time.  Past medical/surgical history, social history, medications, allergies and FH have been reviewed with patient and/or in documentation. Furthermore, if pt family or friend(s) present, additional historical information was obtained from them.  Past Medical History  Diagnosis Date  . Diabetes mellitus without complication   . Hypertension   . Hyperlipidemia   . Lumbar stenosis   . Peripheral vascular disease   . Hemorrhoids   . Tinea pedis   . Neuropathy   . TIA (transient ischemic attack)    Past Surgical History  Procedure Laterality Date  . Hernia repair  2007  . Lumbar decompression surgery  2010  . Cardiac catheterization      [facility]    Family History  Problem Relation Age of Onset  . Family history unknown: Yes   History  Substance Use Topics  . Smoking status: Former Smoker -- 0.25 packs/day for 4 years    Types: Cigarettes    Quit date: 04/17/1994  . Smokeless tobacco: Never Used  . Alcohol Use: No     Review of Systems Constitutional: - F/C, -fatigue.  HENT: - congestion, -rhinorrhea, -sore throat.   Eyes: - eye pain, -visual  disturbance.  Respiratory: - cough, -SOB, -hemoptysis.   Cardiovascular: - CP, -palps.  Gastrointestinal: - N/V/D, -abd pain  Genitourinary: - flank pain, -dysuria, -frequency. + hematuria Musculoskeletal: - myalgia/arthritis, -joint swelling, -gait abnormality, -back pain, -neck pain/stiffness, -leg pain/swelling.  Skin: - rash/lesion.  Neurological: - focal weakness, -lightheadedness, -dizziness, -numbness, -HA.  All other systems reviewed and are negative.   Physical Exam  Physical Exam  ED Triage Vitals  Enc Vitals Group     BP 05/09/15 1856 132/72 mmHg     Pulse Rate 05/09/15 1856 60     Resp 05/09/15 1856 18     Temp 05/09/15 1856 98 F (36.7 C)     Temp Source 05/09/15 1856 Oral     SpO2 05/09/15 1856 96 %     Weight 05/09/15 1856 208 lb (94.348 kg)     Height --      Head Cir --      Peak Flow --      Pain Score --      Pain Loc --      Pain Edu? --      Excl. in GC? --    Constitutional: Patient is well appearing and in no acute distress Head: Normocephalic and atraumatic.  Eyes: Extraocular motion intact, no scleral icterus Mouth: MMM, OP clear Neck: Supple without meningismus, mass, or overt JVD Respiratory: No respiratory distress. Normal WOB. No w/r/g. CV: RRR, no obvious murmurs.  Pulses +  2 and symmetric. Euvolemic Abdomen: Soft, NT, ND, no r/g. No mass.  GU: Patient's penis is uncircumcised and nontender. He does have small amount of yeast to the foreskin. No inflammation noted. No phimosis or paraphimosis. scrotum is nontender and without signs of infection. No blood from meatus. MSK: Extremities  are atraumatic without deformity, ROM intact Skin: Warm, dry, intact without rash Neuro: AAOx4, MAE 5/5 sym, no focal deficit noted   ED Course  Procedures   Labs Reviewed  URINALYSIS, ROUTINE W REFLEX MICROSCOPIC (NOT AT [facility]) - Abnormal; Notable for the following:    Color, Urine AMBER (*)    Specific Gravity, Urine 1.031 (*)    Glucose, UA >1000 (*)     All other components within normal limits  BASIC METABOLIC PANEL - Abnormal; Notable for the following:    Glucose, Bld 107 (*)    All other components within normal limits  URINE CULTURE  CBC WITH DIFFERENTIAL/PLATELET  URINE MICROSCOPIC-ADD ON   I personally reviewed and interpreted all labs.  No orders to display   I personally viewed above image(s) which were used in my medical decision making. Formal interpretations by Radiology.  MDM: Jakyrie Totherow is a 54 y.o. male with H&P as above who p/w CC: painless hematuria  Patient is well-appearing, well-hydrated on arrival. He is hemodynamic stable in no apparent distress. Benign exam as above. Patient received screening labs which were unremarkable today. Patient does not have UTI. Patient likely has BPH causing painless hematuria. No findings to raise concern for kidney stone. I advised him to follow closely with his PCP regarding this issue. He was advised to come to the ED for worsening symptoms, abdominal or flank pain, fevers, chills, nausea, vomiting. Patient is deemed stable for discharge. Old records reviewed (if available). Labs and imaging reviewed personally by myself and considered in medical decision making if ordered. Clinical Impression: 1. Painless hematuria     Disposition: Discharge  Condition: Good  I have discussed the results, Dx and Tx plan with the pt(& family if present). He/she/they expressed understanding and agree(s) with the plan. Discharge instructions discussed at great length. Strict return precautions discussed and pt &/or family have verbalized understanding of the instructions. No further questions at time of discharge.    Discharge Medication List as of 05/09/2015  9:21 PM      Follow Up: Leanna Sato, MD [address] [place] [address] [phone]  Schedule an appointment as soon as possible for a visit in 1 week to discuss symptoms further  [facility] EMERGENCY DEPARTMENT [address] [id number] mc [place] [address] [phone]  If symptoms worsen   Pt seen in conjunction with Dr. Vinnie Langton, DO [facility] Emergency Medicine Resident - PGY-3      Ames Dura, MD 05/10/15 0000  Eber Hong, MD 05/10/15 [phone]

## 2015-05-09 NOTE — Discharge Instructions (Signed)

## 2015-05-09 NOTE — ED Notes (Signed)
The pt is c/o bloody urine for 2 days.  No pain

## 2015-05-09 NOTE — ED Provider Notes (Signed)
The patient is a 54 year old male, he states he has had intermittent hematuria, has had increased amount of bright red blood in the urine today. He denies dysuria, denies diarrhea, denies taking blood thinners, denies penile pain or injury.  On exam the patient has a soft nontender abdomen, normal appearing penis scrotum and testicles, he is uncircumcised, foreskin retracts without difficulty, there is no blood or discharge at the urethral meatus, no signs of trauma or deformity or rash.  The patient will need outpatient urology follow-up, he is well aware, doubt kidney stone, essentially bladder abnormality which urology would need workup, the patient expresses his understanding to the indications for the need for follow-up. Stable for discharge  I saw and evaluated the patient, reviewed the resident's note and I agree with the findings and plan.  Final diagnoses:  Painless hematuria      Eber Hong, MD 05/10/15 [phone]

## 2015-05-11 LAB — URINE CULTURE

## 2016-02-24 DIAGNOSIS — E1142 Type 2 diabetes mellitus with diabetic polyneuropathy: Secondary | ICD-10-CM | POA: Insufficient documentation

## 2016-02-24 DIAGNOSIS — E782 Mixed hyperlipidemia: Secondary | ICD-10-CM | POA: Insufficient documentation

## 2019-01-15 DIAGNOSIS — E039 Hypothyroidism, unspecified: Secondary | ICD-10-CM | POA: Insufficient documentation

## 2019-12-09 ENCOUNTER — Emergency Department (HOSPITAL_COMMUNITY)
Admission: EM | Admit: 2019-12-09 | Discharge: 2019-12-09 | Disposition: A | Discharge status: Home / Self Care | Payer: Medicare HMO | Attending: Emergency Medicine | Admitting: Emergency Medicine

## 2019-12-09 ENCOUNTER — Encounter (HOSPITAL_COMMUNITY): Payer: Self-pay | Admitting: Emergency Medicine

## 2019-12-09 ENCOUNTER — Other Ambulatory Visit: Payer: Self-pay

## 2019-12-09 DIAGNOSIS — K921 Melena: Secondary | ICD-10-CM

## 2019-12-09 DIAGNOSIS — Z79899 Other long term (current) drug therapy: Secondary | ICD-10-CM | POA: Diagnosis not present

## 2019-12-09 DIAGNOSIS — Z7982 Long term (current) use of aspirin: Secondary | ICD-10-CM | POA: Diagnosis not present

## 2019-12-09 DIAGNOSIS — E119 Type 2 diabetes mellitus without complications: Secondary | ICD-10-CM | POA: Insufficient documentation

## 2019-12-09 DIAGNOSIS — I2510 Atherosclerotic heart disease of native coronary artery without angina pectoris: Secondary | ICD-10-CM | POA: Diagnosis not present

## 2019-12-09 DIAGNOSIS — Z7984 Long term (current) use of oral hypoglycemic drugs: Secondary | ICD-10-CM | POA: Insufficient documentation

## 2019-12-09 DIAGNOSIS — I10 Essential (primary) hypertension: Secondary | ICD-10-CM | POA: Diagnosis not present

## 2019-12-09 HISTORY — DX: Atherosclerotic heart disease of native coronary artery without angina pectoris: I25.10

## 2019-12-09 LAB — POC OCCULT BLOOD, ED: Fecal Occult Bld: POSITIVE — AB

## 2019-12-09 LAB — CBC
HCT: 45.1 % (ref 39.0–52.0)
Hemoglobin: 14.9 g/dL (ref 13.0–17.0)
MCH: 30.2 pg (ref 26.0–34.0)
MCHC: 33 g/dL (ref 30.0–36.0)
MCV: 91.3 fL (ref 80.0–100.0)
Platelets: 324 10*3/uL (ref 150–400)
RBC: 4.94 MIL/uL (ref 4.22–5.81)
RDW: 12.8 % (ref 11.5–15.5)
WBC: 6.8 10*3/uL (ref 4.0–10.5)
nRBC: 0 % (ref 0.0–0.2)

## 2019-12-09 LAB — COMPREHENSIVE METABOLIC PANEL
ALT: 25 U/L (ref 0–44)
AST: 19 U/L (ref 15–41)
Albumin: 4.4 g/dL (ref 3.5–5.0)
Alkaline Phosphatase: 58 U/L (ref 38–126)
Anion gap: 10 (ref 5–15)
BUN: 9 mg/dL (ref 6–20)
CO2: 26 mmol/L (ref 22–32)
Calcium: 9.8 mg/dL (ref 8.9–10.3)
Chloride: 101 mmol/L (ref 98–111)
Creatinine, Ser: 0.94 mg/dL (ref 0.61–1.24)
GFR calc Af Amer: >60 mL/min (ref >60)
GFR calc non Af Amer: >60 mL/min (ref >60)
Glucose, Bld: 140 mg/dL — ABNORMAL HIGH (ref 70–99)
Potassium: 4.5 mmol/L (ref 3.5–5.1)
Sodium: 137 mmol/L (ref 135–145)
Total Bilirubin: 0.5 mg/dL (ref 0.3–1.2)
Total Protein: 7.6 g/dL (ref 6.5–8.1)

## 2019-12-09 NOTE — ED Triage Notes (Signed)
Pt. Stated, Ive had blood in my stool for the last 3 days. Ive also had some burning in my stomach.

## 2019-12-09 NOTE — Discharge Instructions (Addendum)
Follow up with your gi doctor to discuss further evaluation, return to the ED for worsening symptoms, feeling lightheaded

## 2019-12-09 NOTE — ED Provider Notes (Signed)
[facility] EMERGENCY DEPARTMENT Provider Note   CSN: [id number] Arrival date & time: 12/09/19  9798     History Chief Complaint  Patient presents with  . Rectal Bleeding  . Abdominal Pain    burning    Shane Preston is a 59 y.o. male.  HPI   Pt started noticing blood in his stool 3 days ago.   He notices the blood when he wipes.  It is bright red.  He has noticed clots.  No abdominal pain.  He denies feeling ligtheaded.  No vomiting. No fevers.  He had a similar problem a few years ago.  He had a colonoscopy but the bleeding had stopped by the time he had the procedure so they did not find a source. Past Medical History:  Diagnosis Date  . Coronary artery disease   . Diabetes mellitus without complication (HCC)   . Hypertension     There are no problems to display for this patient.   History reviewed. No pertinent surgical history.     No family history on file.  Social History   Tobacco Use  . Smoking status: Never Smoker  . Smokeless tobacco: Never Used  Substance Use Topics  . Alcohol use: Not Currently  . Drug use: Not Currently    Home Medications Prior to Admission medications   Medication Sig Start Date End Date Taking? Authorizing Provider  aspirin EC 81 MG tablet Take 81 mg by mouth daily.   Yes [provider]  carvedilol (COREG) 25 MG tablet Take 25 mg by mouth 2 (two) times daily. 11/14/19  Yes [provider]  gabapentin (NEURONTIN) 300 MG capsule Take 300 mg by mouth 3 (three) times daily. 09/25/19  Yes [provider]  JARDIANCE 25 MG TABS tablet Take 25 mg by mouth daily. 09/25/19  Yes [provider]  levothyroxine (SYNTHROID) 25 MCG tablet Take 25 mcg by mouth every morning. 12/06/19  Yes [provider]  lisinopril-hydrochlorothiazide (ZESTORETIC) 20-12.5 MG tablet Take 1 tablet by mouth 2 (two) times daily. 11/06/19  Yes [provider]  metFORMIN (GLUCOPHAGE)  1000 MG tablet Take 1,000 mg by mouth 2 (two) times daily. 11/10/19  Yes [provider]  OZEMPIC, 0.25 OR 0.5 MG/DOSE, 2 MG/1.5ML SOPN Inject 0.5 mg into the skin once a week. Monday 11/22/19  Yes [provider]  rosuvastatin (CRESTOR) 10 MG tablet Take 10 mg by mouth daily. 10/08/19  Yes [provider]    Allergies    Patient has no allergy information on record.  Review of Systems   Review of Systems  All other systems reviewed and are negative.   Physical Exam Updated Vital Signs BP 102/67   Pulse (!) 56   Temp 98.3 F (36.8 C) (Oral)   Resp 16   Ht 1.626 m (5\' 4" )   Wt 95.3 kg   SpO2 96%   BMI 36.05 kg/m   Physical Exam Vitals and nursing note reviewed.  Constitutional:      General: He is not in acute distress.    Appearance: He is well-developed.  HENT:     Head: Normocephalic and atraumatic.     Right Ear: External ear normal.     Left Ear: External ear normal.  Eyes:     General: No scleral icterus.       Right eye: No discharge.        Left eye: No discharge.     Conjunctiva/sclera: Conjunctivae normal.  Neck:     Trachea: No tracheal deviation.  Cardiovascular:     Rate and Rhythm: Normal rate and regular rhythm.  Pulmonary:     Effort: Pulmonary effort is normal. No respiratory distress.     Breath sounds: Normal breath sounds. No stridor. No wheezing or rales.  Abdominal:     General: Bowel sounds are normal. There is no distension.     Palpations: Abdomen is soft.     Tenderness: There is no abdominal tenderness. There is no guarding or rebound.  Genitourinary:    Comments: No mass appreciated, no bright red blood, blood-tinge noted on glove Musculoskeletal:        General: No tenderness.     Cervical back: Neck supple.  Skin:    General: Skin is warm and dry.     Findings: No rash.  Neurological:     Mental Status: He is alert.     Cranial Nerves: No cranial nerve deficit (no facial droop, extraocular movements  intact, no slurred speech).     Sensory: No sensory deficit.     Motor: No abnormal muscle tone or seizure activity.     Coordination: Coordination normal.     ED Results / Procedures / Treatments   Labs (all labs ordered are listed, but only abnormal results are displayed) Labs Reviewed  COMPREHENSIVE METABOLIC PANEL - Abnormal; Notable for the following components:      Result Value   Glucose, Bld 140 (*)    All other components within normal limits  POC OCCULT BLOOD, ED - Abnormal; Notable for the following components:   Fecal Occult Bld POSITIVE (*)    All other components within normal limits  CBC  TYPE AND SCREEN    EKG None  Radiology No results found.  Procedures Procedures (including critical care time)  Medications Ordered in ED Medications - No data to display  ED Course  I have reviewed the triage vital signs and the nursing notes.  Pertinent labs & imaging results that were available during my care of the patient were reviewed by me and considered in my medical decision making (see chart for details).  Clinical Course as of Dec 08 1028  Sun Dec 09, 2019  1006 Fecal occult is positive   [JK]  1006 Blood test otherwise unremarkable   [JK]    Clinical Course User Index [JK] Dorie Rank, MD   MDM Rules/Calculators/A&P                      Pt presents with rectal bleeding.  Hx of prior GI workup.   Sx do not suggest acute infection.  Abd is benign.  Labs are reassuring. No sign of significant blood loss.  Unclear etiology.  Could be hemorrhoids but will need further workup.  Appears stable for outpt gi follow up.  Warning signs and precautions discussed. Final Clinical Impression(s) / ED Diagnoses Final diagnoses:  Hematochezia    Rx / DC Orders ED Discharge Orders    None       Dorie Rank, MD 12/09/19 1032

## 2019-12-10 ENCOUNTER — Encounter (HOSPITAL_COMMUNITY): Payer: Self-pay | Admitting: *Deleted

## 2021-03-25 DIAGNOSIS — H269 Unspecified cataract: Secondary | ICD-10-CM

## 2021-03-25 HISTORY — DX: Unspecified cataract: H26.9

## 2021-05-05 DIAGNOSIS — Z113 Encounter for screening for infections with a predominantly sexual mode of transmission: Secondary | ICD-10-CM | POA: Insufficient documentation

## 2021-05-05 HISTORY — DX: Encounter for screening for infections with a predominantly sexual mode of transmission: Z11.3

## 2021-05-14 ENCOUNTER — Ambulatory Visit (INDEPENDENT_AMBULATORY_CARE_PROVIDER_SITE_OTHER): Payer: Medicare HMO | Admitting: Internal Medicine

## 2021-05-14 ENCOUNTER — Other Ambulatory Visit: Payer: Self-pay

## 2021-05-14 ENCOUNTER — Encounter: Payer: Self-pay | Admitting: Internal Medicine

## 2021-05-14 VITALS — BP 120/77 | HR 56 | Temp 98.1°F | Ht 61.61 in | Wt 201.6 lb

## 2021-05-14 DIAGNOSIS — M549 Dorsalgia, unspecified: Secondary | ICD-10-CM | POA: Diagnosis not present

## 2021-05-14 DIAGNOSIS — E039 Hypothyroidism, unspecified: Secondary | ICD-10-CM | POA: Insufficient documentation

## 2021-05-14 DIAGNOSIS — E1140 Type 2 diabetes mellitus with diabetic neuropathy, unspecified: Secondary | ICD-10-CM | POA: Insufficient documentation

## 2021-05-14 DIAGNOSIS — E119 Type 2 diabetes mellitus without complications: Secondary | ICD-10-CM | POA: Diagnosis not present

## 2021-05-14 DIAGNOSIS — R079 Chest pain, unspecified: Secondary | ICD-10-CM

## 2021-05-14 LAB — URINALYSIS, ROUTINE W REFLEX MICROSCOPIC
Bilirubin, UA: NEGATIVE
Ketones, UA: NEGATIVE
Leukocytes,UA: NEGATIVE
Nitrite, UA: NEGATIVE
Protein,UA: NEGATIVE
RBC, UA: NEGATIVE
Specific Gravity, UA: 1.01 (ref 1.005–1.030)
Urobilinogen, Ur: 0.2 mg/dL (ref 0.2–1.0)
pH, UA: 5.5 (ref 5.0–7.5)

## 2021-05-14 LAB — BAYER DCA HB A1C WAIVED: HB A1C (BAYER DCA - WAIVED): 7.4 % — ABNORMAL HIGH (ref <7.0)

## 2021-05-14 NOTE — Progress Notes (Signed)
BP 120/77   Pulse (!) 56   Temp 98.1 F (36.7 C) (Oral)   Ht 5' 1.61" (1.565 m)   Wt 201 lb 9.6 oz (91.4 kg)   SpO2 99%   BMI 37.34 kg/m    Subjective:    Patient ID: Shane Preston, male    DOB: Sep 07, 1961, 60 y.o.   MRN: [id number]  Chief Complaint  Patient presents with   New Patient (Initial Visit)   Diabetes    HPI: Shane Preston is a 60 y.o. male  Pt is here to establish care, moved here from [place] when he was 60 yrs old Moved here from [place], hence establishing care. is new to the practice, has a ho HTN/ DM / hypothyroidism.  Has intermittent chest pain is on NG seen by cards for such in the past.    Diabetes He presents for his follow-up (a1c - las time - 7.1 x 1 month ago in [place]) diabetic visit. He has type 2 diabetes mellitus. His disease course has been fluctuating. Associated symptoms include chest pain.  Chest Pain  This is a recurrent (last time he had chest pain - 3 months ago.) problem. The current episode started more than 1 month ago. The problem has been gradually worsening. Associated symptoms include back pain.  Back Pain This is a chronic (s/p surgery x 10 yrs.L spine surg) problem. The current episode started more than 1 year ago. Associated symptoms include chest pain.   Chief Complaint  Patient presents with   New Patient (Initial Visit)   Diabetes    Relevant past medical, surgical, family and social history reviewed and updated as indicated. Interim medical history since our last visit reviewed. Allergies and medications reviewed and updated.  Review of Systems  Cardiovascular:  Positive for chest pain.  Musculoskeletal:  Positive for back pain.   Per HPI unless specifically indicated above     Objective:    BP 120/77   Pulse (!) 56   Temp 98.1 F (36.7 C) (Oral)   Ht 5' 1.61" (1.565 m)   Wt 201 lb 9.6 oz (91.4 kg)   SpO2 99%   BMI 37.34 kg/m   Wt Readings from Last 3 Encounters:  05/14/21  201 lb 9.6 oz (91.4 kg)  12/09/19 210 lb (95.3 kg)  05/09/15 208 lb (94.3 kg)    Physical Exam Vitals and nursing note reviewed.  Constitutional:      General: He is not in acute distress.    Appearance: Normal appearance. He is not ill-appearing or diaphoretic.  HENT:     Head: Normocephalic and atraumatic.     Right Ear: Tympanic membrane and external ear normal. There is no impacted cerumen.     Left Ear: External ear normal.     Nose: No congestion or rhinorrhea.     Mouth/Throat:     Pharynx: No oropharyngeal exudate or posterior oropharyngeal erythema.  Eyes:     Conjunctiva/sclera: Conjunctivae normal.     Pupils: Pupils are equal, round, and reactive to light.  Cardiovascular:     Rate and Rhythm: Normal rate and regular rhythm.     Heart sounds: No murmur heard.   No friction rub. No gallop.  Pulmonary:     Effort: No respiratory distress.     Breath sounds: No stridor. No wheezing or rhonchi.  Chest:     Chest wall: No tenderness.  Abdominal:     General: Abdomen is flat. Bowel sounds are normal.  Palpations: Abdomen is soft. There is no mass.     Tenderness: There is no abdominal tenderness.  Musculoskeletal:     Cervical back: Normal range of motion and neck supple. No rigidity or tenderness.     Left lower leg: No edema.  Skin:    General: Skin is warm and dry.  Neurological:     Mental Status: He is alert.   Results for orders placed or performed during the hospital encounter of 12/09/19  Comprehensive metabolic panel  Result Value Ref Range   Sodium 137 135 - 145 mmol/L   Potassium 4.5 3.5 - 5.1 mmol/L   Chloride 101 98 - 111 mmol/L   CO2 26 22 - 32 mmol/L   Glucose, Bld 140 (H) 70 - 99 mg/dL   BUN 9 6 - 20 mg/dL   Creatinine, Ser 6.04 0.61 - 1.24 mg/dL   Calcium 9.8 8.9 - 54.0 mg/dL   Total Protein 7.6 6.5 - 8.1 g/dL   Albumin 4.4 3.5 - 5.0 g/dL   AST 19 15 - 41 U/L   ALT 25 0 - 44 U/L   Alkaline Phosphatase 58 38 - 126 U/L   Total Bilirubin  0.5 0.3 - 1.2 mg/dL   GFR calc non Af Amer >60 >60 mL/min   GFR calc Af Amer >60 >60 mL/min   Anion gap 10 5 - 15  CBC  Result Value Ref Range   WBC 6.8 4.0 - 10.5 K/uL   RBC 4.94 4.22 - 5.81 MIL/uL   Hemoglobin 14.9 13.0 - 17.0 g/dL   HCT 98.1 19.1 - 47.8 %   MCV 91.3 80.0 - 100.0 fL   MCH 30.2 26.0 - 34.0 pg   MCHC 33.0 30.0 - 36.0 g/dL   RDW 29.5 62.1 - 30.8 %   Platelets 324 150 - 400 K/uL   nRBC 0.0 0.0 - 0.2 %  POC occult blood, ED  Result Value Ref Range   Fecal Occult Bld POSITIVE (A) NEGATIVE        Current Outpatient Medications:    aspirin EC 81 MG tablet, Take 81 mg by mouth daily., Disp: , Rfl:    carvedilol (COREG) 25 MG tablet, Take 25 mg by mouth 2 (two) times daily., Disp: , Rfl:    gabapentin (NEURONTIN) 300 MG capsule, Take 300 mg by mouth 3 (three) times daily., Disp: , Rfl:    glucose blood test strip, 2 each by Other route daily. Use as instructed, Disp: , Rfl:    JARDIANCE 25 MG TABS tablet, Take 25 mg by mouth daily., Disp: , Rfl:    levothyroxine (SYNTHROID) 25 MCG tablet, Take 25 mcg by mouth every morning., Disp: , Rfl:    lisinopril-hydrochlorothiazide (ZESTORETIC) 20-12.5 MG tablet, Take 1 tablet by mouth 2 (two) times daily., Disp: , Rfl:    metFORMIN (GLUCOPHAGE) 1000 MG tablet, Take 1,000 mg by mouth 2 (two) times daily., Disp: , Rfl:    nitroGLYCERIN (NITROSTAT) 0.4 MG SL tablet, Place 0.4 mg under the tongue every 5 (five) minutes as needed for chest pain., Disp: , Rfl:    OZEMPIC, 0.25 OR 0.5 MG/DOSE, 2 MG/1.5ML SOPN, Inject 0.5 mg into the skin once a week. Monday, Disp: , Rfl:    rosuvastatin (CRESTOR) 10 MG tablet, Take 10 mg by mouth daily., Disp: , Rfl:    LISINOPRIL PO, Take 1 tablet by mouth daily. (Patient not taking: Reported on 05/14/2021), Disp: , Rfl:    Magnesium Oxide -Mg Supplement 250 MG  TABS, Take 1 tablet by mouth daily., Disp: , Rfl:    naproxen (NAPROSYN) 500 MG tablet, Take 1 tablet (500 mg total) by mouth 2 (two) times daily.  (Patient not taking: Reported on 05/14/2021), Disp: 14 tablet, Rfl: 0    Assessment & Plan:  DM   Continue ozempic metformin and jardiacne.  check HbA1c,  urine  microalbumin  diabetic diet plan given to pt  adviced regarding hypoglycemia and instructions given to pt today on how to prevent and treat the same if it were to occur. pt acknowledges the plan and voices understanding of the same.  exercise plan given and encouraged.   advice diabetic yearly podiatry, ophthalmology , nutritionist , dental check q 6 months,   2. Hypothyroidism  25 mcg of synthroid.  HYPOTHYROIDISM PLEASE TAKE YOUR THYROID MEDICATION FIRST THING IN THE MORNING WHILST FASTING.  NO MEDICATION/ FOOD FOR AN HOUR AFTER INGESTING THYROID PILLS.  3. Chest pain will set up pt with cards ? Needs stress test  4. Back pain takes neurontin, s/p back surg  Will set up with ortho   5. CAD is on NG and coreg and ASA also lisinopril . Problem List Items Addressed This Visit   None    No orders of the defined types were placed in this encounter.    No orders of the defined types were placed in this encounter.    Follow up plan: No follow-ups on file.  Health Maintenance :  Cscope :d due, refer next visit. Pneumonia vaccine : pneumovax ? In past.

## 2021-05-15 LAB — BASIC METABOLIC PANEL
BUN/Creatinine Ratio: 12 (ref 10–24)
BUN: 13 mg/dL (ref 8–27)
CO2: 23 mmol/L (ref 20–29)
Calcium: 9.6 mg/dL (ref 8.6–10.2)
Chloride: 101 mmol/L (ref 96–106)
Creatinine, Ser: 1.08 mg/dL (ref 0.76–1.27)
Glucose: 118 mg/dL — ABNORMAL HIGH (ref 65–99)
Potassium: 4.8 mmol/L (ref 3.5–5.2)
Sodium: 138 mmol/L (ref 134–144)
eGFR: 78 mL/min/{1.73_m2} (ref >59)

## 2021-05-15 LAB — LIPID PANEL
Chol/HDL Ratio: 3.5 ratio (ref 0.0–5.0)
Cholesterol, Total: 143 mg/dL (ref 100–199)
HDL: 41 mg/dL (ref >39)
LDL Chol Calc (NIH): 68 mg/dL (ref 0–99)
Triglycerides: 206 mg/dL — ABNORMAL HIGH (ref 0–149)
VLDL Cholesterol Cal: 34 mg/dL (ref 5–40)

## 2021-05-15 LAB — TSH: TSH: 4.46 u[IU]/mL (ref 0.450–4.500)

## 2021-05-15 LAB — VITAMIN B12: Vitamin B-12: 463 pg/mL (ref 232–1245)

## 2021-05-15 LAB — T4, FREE: Free T4: 1.22 ng/dL (ref 0.82–1.77)

## 2021-05-19 ENCOUNTER — Encounter: Payer: Self-pay | Admitting: Cardiovascular Disease

## 2021-05-19 ENCOUNTER — Telehealth: Payer: Self-pay | Admitting: Cardiovascular Disease

## 2021-05-19 ENCOUNTER — Ambulatory Visit (INDEPENDENT_AMBULATORY_CARE_PROVIDER_SITE_OTHER): Payer: Medicare HMO | Admitting: Cardiovascular Disease

## 2021-05-19 ENCOUNTER — Other Ambulatory Visit: Payer: Self-pay

## 2021-05-19 VITALS — BP 100/70 | HR 60 | Ht 61.0 in | Wt 200.4 lb

## 2021-05-19 DIAGNOSIS — I10 Essential (primary) hypertension: Secondary | ICD-10-CM

## 2021-05-19 DIAGNOSIS — E785 Hyperlipidemia, unspecified: Secondary | ICD-10-CM | POA: Diagnosis not present

## 2021-05-19 DIAGNOSIS — R072 Precordial pain: Secondary | ICD-10-CM | POA: Diagnosis not present

## 2021-05-19 MED ORDER — LISINOPRIL-HYDROCHLOROTHIAZIDE 20-12.5 MG PO TABS: 1.0000 | ORAL_TABLET | Freq: Every day | ORAL | 2 refills | Status: DC

## 2021-05-19 NOTE — Patient Instructions (Signed)
Medication Instructions:  Your physician has recommended you make the following change in your medication:   DECREASE Lisinopril HCTZ to 20-12.5 mg daily.  Make sure to take your Carvedilol the day of your Cardiac CTA.  *If you need a refill on your cardiac medications before your next appointment, please call your pharmacy*   Lab Work: None ordered  If you have labs (blood work) drawn today and your tests are completely normal, you will receive your results only by: MyChart Message (if you have MyChart) OR A paper copy in the mail If you have any lab test that is abnormal or we need to change your treatment, we will call you to review the results.   Testing/Procedures: Your physician has requested that you have cardiac CT. Cardiac computed tomography (CT) is a painless test that uses an x-ray machine to take clear, detailed pictures of your heart. For further information please visit [email or web address] Please follow instruction sheet as given.     Follow-Up: At [facility], you and your health needs are our priority.  As part of our continuing mission to provide you with exceptional heart care, we have created designated Provider Care Teams.  These Care Teams include your primary Cardiologist (physician) and Advanced Practice Providers (APPs -  Physician Assistants and Nurse Practitioners) who all work together to provide you with the care you need, when you need it.  We recommend signing up for the patient portal called "MyChart".  Sign up information is provided on this After Visit Summary.  MyChart is used to connect with patients for Virtual Visits (Telemedicine).  Patients are able to view lab/test results, encounter notes, upcoming appointments, etc.  Non-urgent messages can be sent to your provider as well.   To learn more about what you can do with MyChart, go to [email or web address].    Your next appointment:   As nededed  The format for your next appointment:    In Person  Provider:   You may see . or one of the following Advanced Practice Providers on your designated Care Team:   Nicolasa Ducking, NP Eula Listen, PA-C Marisue Ivan, PA-C Cadence Fransico Michael, [place]   Other Instructions   Your cardiac CT will be scheduled at one of the below locations:   [facility] [address] [place], [place] [address] [phone]  OR  [facility] [address] [place], [place] [address] [phone]  If scheduled at [facility], please arrive at the [facility] main entrance (entrance A) of [facility] 30 minutes prior to test start time. Proceed to the [facility] Radiology Department (first floor) to check-in and test prep.  If scheduled at [facility], please arrive 15 mins early for check-in and test prep.  Please follow these instructions carefully (unless otherwise directed):  HOLD erectile dysfunction medication 72 hours prior to the test  On the Night Before the Test: Be sure to Drink plenty of water. Do not consume any caffeinated/decaffeinated beverages or chocolate 12 hours prior to your test. Do not take any antihistamines 12 hours prior to your test.  On the Day of the Test: Drink plenty of water until 1 hour prior to the test. Do not eat any food 4 hours prior to the test. You may take your regular medications prior to the test.  Take metoprolol (Lopressor) two hours prior to test. HOLD Hydrochlorothiazide morning of the test.  After the Test: Drink plenty of water. After receiving IV contrast, you may experience a mild flushed feeling. This is normal. On occasion, you may experience a mild rash up to 24 hours after the test. This is not dangerous. If this occurs, you can take Benadryl 25 mg and increase your fluid intake. If you experience trouble breathing, this can be serious. If it is severe call 911  IMMEDIATELY. If it is mild, please call our office. If you take any of these medications: Glipizide/Metformin, Avandament, Glucavance, please do not take 48 hours after completing test unless otherwise instructed.  Please allow 2-4 weeks for scheduling of routine cardiac CTs. Some insurance companies require a pre-authorization which may delay scheduling of this test.   For non-scheduling related questions, please contact the cardiac imaging nurse navigator should you have any questions/concerns: Rockwell Alexandria, Cardiac Imaging Nurse Navigator Larey Brick, Cardiac Imaging Nurse Navigator [facility] Direct Office Dial: [phone]   For scheduling needs, including cancellations and rescheduling, please call [place], [phone].    YFor non-scheduling related questions, please contact the cardiac imaging nurse navigator should you have any questions/concerns: Rockwell Alexandria, Cardiac Imaging Nurse Navigator Larey Brick, Cardiac Imaging Nurse Navigator [facility] Direct Office Dial: [phone]   For scheduling needs, including cancellations and rescheduling, please call [place], [phone].

## 2021-05-19 NOTE — Telephone Encounter (Signed)
Patient calling Needs most recent prescriptions sent in to go to [facility] and not [facility]

## 2021-05-19 NOTE — Telephone Encounter (Signed)
Rx for Lisinopril HCTZ recent to [facility] as requested.

## 2021-05-19 NOTE — Progress Notes (Signed)
Cardiology Office Note   Date:  05/19/2021   ID:  Delora Fuel [place], [place] 06-29-1961, MRN [id number]  PCP:  [name], MD  Cardiologist:   Lorine Bears, MD   Chief Complaint  Patient presents with   Other    Chest pain. Meds reviewed verbally with pt.      History of Present Illness: Shane Preston is a 60 y.o. male who was referred by Dr. Charlotta Newton for evaluation of chest pain. He has known history of diabetes mellitus, essential hypertension, hyperlipidemia and TIA.  He had previous cardiac catheterization at [facility] in February 2012 which was normal.  He is not a smoker and has no family history of coronary artery disease.  He was hospitalized at [facility] with chest pain in April 2015.  He underwent a Lexiscan Myoview which showed no evidence of ischemia.  No cardiac testing since then.  He reports prolonged history of substernal chest pain occasionally described as sharp discomfort and sometimes aching that can happen with exertion or at rest when he is watching TV.  He has been prescribed nitroglycerin in the past and the pain usually responds to 1 nitroglycerin. Episodes of chest pain became more frequent recently and thus he is referred for evaluation.  He does not exercise on a regular basis.  His blood pressure has been running somewhat on the low side.  He does describe orthostatic dizziness.  Past Medical History:  Diagnosis Date   Coronary artery disease    Diabetes mellitus without complication (HCC)    Hemorrhoids    Hyperlipidemia    Hypertension    Lumbar stenosis    Neuropathy    Peripheral vascular disease (HCC)    TIA (transient ischemic attack)    Tinea pedis     Past Surgical History:  Procedure Laterality Date   CARDIAC CATHETERIZATION     [facility]    EYE SURGERY     HERNIA REPAIR  10/11/2005   lumbar decompression surgery  10/11/2008     Current Outpatient Medications  Medication Sig Dispense Refill   aspirin EC 81 MG tablet Take 81  mg by mouth daily.     carvedilol (COREG) 25 MG tablet Take 25 mg by mouth 2 (two) times daily.     gabapentin (NEURONTIN) 300 MG capsule Take 300 mg by mouth 3 (three) times daily.     glucose blood test strip 2 each by Other route daily. Use as instructed     JARDIANCE 25 MG TABS tablet Take 25 mg by mouth daily.     levothyroxine (SYNTHROID) 25 MCG tablet Take 25 mcg by mouth every morning.     lisinopril-hydrochlorothiazide (ZESTORETIC) 20-12.5 MG tablet Take 1 tablet by mouth 2 (two) times daily.     Magnesium Oxide -Mg Supplement 250 MG TABS Take 1 tablet by mouth daily.     metFORMIN (GLUCOPHAGE) 1000 MG tablet Take 1,000 mg by mouth 2 (two) times daily.     naproxen (NAPROSYN) 500 MG tablet Take 1 tablet (500 mg total) by mouth 2 (two) times daily. (Patient not taking: Reported on 05/14/2021) 14 tablet 0   nitroGLYCERIN (NITROSTAT) 0.4 MG SL tablet Place 0.4 mg under the tongue every 5 (five) minutes as needed for chest pain.     OZEMPIC, 0.25 OR 0.5 MG/DOSE, 2 MG/1.5ML SOPN Inject 0.5 mg into the skin once a week. Monday     rosuvastatin (CRESTOR) 10 MG tablet Take 10 mg by mouth daily.  No current facility-administered medications for this visit.    Allergies:   Patient has no known allergies.    Social History:  The patient  reports that he has never smoked. He has never used smokeless tobacco. He reports previous alcohol use. He reports previous drug use.   Family History:  The patient's family history is negative for coronary artery disease.   ROS:  Please see the history of present illness.   Otherwise, review of systems are positive for none.   All other systems are reviewed and negative.    PHYSICAL EXAM: VS:  BP 100/70 (BP Location: Right Arm, Patient Position: Sitting, Cuff Size: Large)   Pulse 60   Ht 5\' 1"  (1.549 m)   Wt 200 lb 6 oz (90.9 kg)   SpO2 98%   BMI 37.86 kg/m  , BMI Body mass index is 37.86 kg/m. GEN: Well nourished, well developed, in no acute  distress  HEENT: normal  Neck: no JVD, carotid bruits, or masses Cardiac: RRR; no murmurs, rubs, or gallops,no edema  Respiratory:  clear to auscultation bilaterally, normal work of breathing GI: soft, nontender, nondistended, + BS MS: no deformity or atrophy  Skin: warm and dry, no rash Neuro:  Strength and sensation are intact Psych: euthymic mood, full affect   EKG:  EKG is ordered today. The ekg ordered today demonstrates normal sinus rhythm with no significant ST or T wave changes.   Recent Labs: 05/14/2021: BUN 13; Creatinine, Ser 1.08; Potassium 4.8; Sodium 138; TSH 4.460    Lipid Panel    Component Value Date/Time   CHOL 143 05/14/2021 1047   TRIG 206 (H) 05/14/2021 1047   HDL 41 05/14/2021 1047   CHOLHDL 3.5 05/14/2021 1047   LDLCALC 68 05/14/2021 1047      Wt Readings from Last 3 Encounters:  05/19/21 200 lb 6 oz (90.9 kg)  05/14/21 201 lb 9.6 oz (91.4 kg)  12/09/19 210 lb (95.3 kg)       PAD Screen 05/19/2021  Previous PAD dx? No  Previous surgical procedure? Yes  Pain with walking? No  Feet/toe relief with dangling? No  Painful, non-healing ulcers? No  Extremities discolored? No      ASSESSMENT AND PLAN:  1.  Chest pain: The patient has prolonged history of chest pain that seems to respond to nitroglycerin and occasionally is exertional.  He has multiple risk factors for coronary artery disease including diabetes mellitus, hypertension and hyperlipidemia.  I recommend evaluation with cardiac CTA with FFR if positive.  He is not allergic to contrast.  2.  Essential hypertension: His blood pressure is low and he does complain of orthostatic dizziness.  Thus, I elected to decrease lisinopril-hydrochlorothiazide to once daily.  3.  Hyperlipidemia: Currently on rosuvastatin 10 mg daily.  I reviewed his recent labs which showed an LDL of 68 which is at target.   Disposition:   Proceed with cardiac CTA and follow-up with me if  abnormal.  Signed,  07/19/2021, MD  05/19/2021 2:20 PM    [facility]

## 2021-05-20 ENCOUNTER — Telehealth (HOSPITAL_COMMUNITY): Payer: Self-pay | Admitting: Emergency Medicine

## 2021-05-20 NOTE — Telephone Encounter (Signed)
Attempted to call patient to review CCTA instructions Patient answered but said nothing and hung up  Rockwell Alexandria RN Navigator Cardiac Imaging [facility] [phone] Office  [phone] Cell

## 2021-05-21 ENCOUNTER — Ambulatory Visit
Admission: RE | Admit: 2021-05-21 | Discharge: 2021-05-21 | Disposition: A | Discharge status: Home / Self Care | Payer: Medicare HMO | Source: Ambulatory Visit | Attending: Cardiovascular Disease | Admitting: Cardiovascular Disease

## 2021-05-21 ENCOUNTER — Other Ambulatory Visit: Payer: Self-pay

## 2021-05-21 ENCOUNTER — Other Ambulatory Visit: Payer: Self-pay | Admitting: *Deleted

## 2021-05-21 DIAGNOSIS — R072 Precordial pain: Secondary | ICD-10-CM | POA: Insufficient documentation

## 2021-05-21 MED ORDER — IOHEXOL 350 MG/ML SOLN
100.0000 mL | Freq: Once | INTRAVENOUS | Status: AC | PRN
  Administered 2021-05-21: 100 mL via INTRAVENOUS

## 2021-05-21 MED ORDER — NITROGLYCERIN 0.4 MG SL SUBL
0.8000 mg | SUBLINGUAL_TABLET | Freq: Once | SUBLINGUAL | Status: AC
  Administered 2021-05-21: 0.8 mg via SUBLINGUAL

## 2021-05-21 MED ORDER — JARDIANCE 25 MG PO TABS: 25.0000 mg | ORAL_TABLET | Freq: Every day | ORAL | 2 refills | Status: DC

## 2021-05-21 MED ORDER — METOPROLOL TARTRATE 5 MG/5ML IV SOLN
5.0000 mg | Freq: Once | INTRAVENOUS | Status: AC
  Administered 2021-05-21: 5 mg via INTRAVENOUS

## 2021-05-21 MED ORDER — METFORMIN HCL 1000 MG PO TABS: 1000.0000 mg | ORAL_TABLET | Freq: Two times a day (BID) | ORAL | 1 refills | Status: DC

## 2021-05-21 NOTE — Telephone Encounter (Signed)
Patient was requesting refills of his Metformin and his Jardiance  sent in to [facility]. Prescriptions were forwarded to Dr. Charlotta Newton for approval

## 2021-05-21 NOTE — Telephone Encounter (Signed)
New Pt said that you were to send his meds to [facility]. They called and they haven't received anything. Can you please send his meds to [place] he has been over there 4X's

## 2021-05-21 NOTE — Progress Notes (Signed)
Patient tolerated CT well. Drank water after. Vital signs stable encourage to drink water throughout day.Reasons explained and verbalized understanding. Ambulated steady gait.  

## 2021-06-03 ENCOUNTER — Telehealth: Payer: Self-pay

## 2021-06-03 ENCOUNTER — Other Ambulatory Visit: Payer: Self-pay

## 2021-06-03 MED ORDER — GABAPENTIN 300 MG PO CAPS: 300.0000 mg | ORAL_CAPSULE | Freq: Three times a day (TID) | ORAL | 1 refills | Status: DC

## 2021-06-03 NOTE — Telephone Encounter (Signed)
Refill request sent to PCP for refill

## 2021-06-03 NOTE — Telephone Encounter (Signed)
Call from [employer] requesting Gabapentin refill.  Previous pharmacy does not have any refills left.

## 2021-06-03 NOTE — Telephone Encounter (Signed)
Refill request for Gabapentin Last ov 05/14/21 Up coming 07/21/21

## 2021-07-14 ENCOUNTER — Other Ambulatory Visit: Payer: Medicare HMO

## 2021-07-14 ENCOUNTER — Other Ambulatory Visit: Payer: Self-pay

## 2021-07-14 DIAGNOSIS — E782 Mixed hyperlipidemia: Secondary | ICD-10-CM

## 2021-07-14 DIAGNOSIS — E119 Type 2 diabetes mellitus without complications: Secondary | ICD-10-CM

## 2021-07-14 DIAGNOSIS — E039 Hypothyroidism, unspecified: Secondary | ICD-10-CM

## 2021-07-14 LAB — MICROALBUMIN, URINE WAIVED
Creatinine, Urine Waived: 50 mg/dL (ref 10–300)
Microalb, Ur Waived: 10 mg/L (ref 0–19)
Microalb/Creat Ratio: <30 mg/g (ref <30)

## 2021-07-15 LAB — CBC WITH DIFFERENTIAL/PLATELET
Basophils Absolute: 0.1 10*3/uL (ref 0.0–0.2)
Basos: 1 %
EOS (ABSOLUTE): 0.1 10*3/uL (ref 0.0–0.4)
Eos: 2 %
Hematocrit: 40.4 % (ref 37.5–51.0)
Hemoglobin: 12.4 g/dL — ABNORMAL LOW (ref 13.0–17.7)
Immature Grans (Abs): 0 10*3/uL (ref 0.0–0.1)
Immature Granulocytes: 0 %
Lymphocytes Absolute: 1.8 10*3/uL (ref 0.7–3.1)
Lymphs: 27 %
MCH: 25.3 pg — ABNORMAL LOW (ref 26.6–33.0)
MCHC: 30.7 g/dL — ABNORMAL LOW (ref 31.5–35.7)
MCV: 82 fL (ref 79–97)
Monocytes Absolute: 0.5 10*3/uL (ref 0.1–0.9)
Monocytes: 7 %
Neutrophils Absolute: 4.3 10*3/uL (ref 1.4–7.0)
Neutrophils: 63 %
Platelets: 400 10*3/uL (ref 150–450)
RBC: 4.9 x10E6/uL (ref 4.14–5.80)
RDW: 13.9 % (ref 11.6–15.4)
WBC: 6.7 10*3/uL (ref 3.4–10.8)

## 2021-07-15 LAB — PSA: Prostate Specific Ag, Serum: 0.5 ng/mL (ref 0.0–4.0)

## 2021-07-15 LAB — HEMOGLOBIN A1C
Est. average glucose Bld gHb Est-mCnc: 174 mg/dL
Hgb A1c MFr Bld: 7.7 % — ABNORMAL HIGH (ref 4.8–5.6)

## 2021-07-15 LAB — COMPREHENSIVE METABOLIC PANEL
ALT: 15 IU/L (ref 0–44)
AST: 13 IU/L (ref 0–40)
Albumin/Globulin Ratio: 1.7 (ref 1.2–2.2)
Albumin: 4.7 g/dL (ref 3.8–4.8)
Alkaline Phosphatase: 82 IU/L (ref 44–121)
BUN/Creatinine Ratio: 12 (ref 10–24)
BUN: 12 mg/dL (ref 8–27)
Bilirubin Total: 0.2 mg/dL (ref 0.0–1.2)
CO2: 19 mmol/L — ABNORMAL LOW (ref 20–29)
Calcium: 9.5 mg/dL (ref 8.6–10.2)
Chloride: 100 mmol/L (ref 96–106)
Creatinine, Ser: 0.99 mg/dL (ref 0.76–1.27)
Globulin, Total: 2.7 g/dL (ref 1.5–4.5)
Glucose: 122 mg/dL — ABNORMAL HIGH (ref 70–99)
Potassium: 4.8 mmol/L (ref 3.5–5.2)
Sodium: 138 mmol/L (ref 134–144)
Total Protein: 7.4 g/dL (ref 6.0–8.5)
eGFR: 87 mL/min/{1.73_m2} (ref >59)

## 2021-07-15 LAB — TSH: TSH: 3.73 u[IU]/mL (ref 0.450–4.500)

## 2021-07-21 ENCOUNTER — Ambulatory Visit (INDEPENDENT_AMBULATORY_CARE_PROVIDER_SITE_OTHER): Payer: Medicare HMO | Admitting: Nurse Practitioner

## 2021-07-21 ENCOUNTER — Encounter: Payer: Self-pay | Admitting: Nurse Practitioner

## 2021-07-21 ENCOUNTER — Ambulatory Visit: Payer: Medicare HMO | Admitting: Internal Medicine

## 2021-07-21 ENCOUNTER — Other Ambulatory Visit: Payer: Self-pay

## 2021-07-21 VITALS — BP 107/71 | HR 60 | Temp 98.5°F | Ht 60.98 in | Wt 199.8 lb

## 2021-07-21 DIAGNOSIS — D649 Anemia, unspecified: Secondary | ICD-10-CM

## 2021-07-21 DIAGNOSIS — E119 Type 2 diabetes mellitus without complications: Secondary | ICD-10-CM

## 2021-07-21 DIAGNOSIS — E782 Mixed hyperlipidemia: Secondary | ICD-10-CM | POA: Diagnosis not present

## 2021-07-21 DIAGNOSIS — I10 Essential (primary) hypertension: Secondary | ICD-10-CM

## 2021-07-21 DIAGNOSIS — Z23 Encounter for immunization: Secondary | ICD-10-CM | POA: Diagnosis not present

## 2021-07-21 DIAGNOSIS — E039 Hypothyroidism, unspecified: Secondary | ICD-10-CM

## 2021-07-21 DIAGNOSIS — M549 Dorsalgia, unspecified: Secondary | ICD-10-CM

## 2021-07-21 MED ORDER — GABAPENTIN 300 MG PO CAPS
300.0000 mg | ORAL_CAPSULE | Freq: Three times a day (TID) | ORAL | 1 refills | Status: DC
Start: 2021-07-21 — End: 2021-10-21

## 2021-07-21 MED ORDER — JARDIANCE 25 MG PO TABS: 25.0000 mg | ORAL_TABLET | Freq: Every day | ORAL | 0 refills | Status: DC

## 2021-07-21 MED ORDER — SEMAGLUTIDE (1 MG/DOSE) 4 MG/3ML ~~LOC~~ SOPN: 1.0000 mg | PEN_INJECTOR | SUBCUTANEOUS | 3 refills | Status: DC

## 2021-07-21 MED ORDER — CARVEDILOL 25 MG PO TABS: 25.0000 mg | ORAL_TABLET | Freq: Two times a day (BID) | ORAL | 0 refills | Status: DC

## 2021-07-21 MED ORDER — LISINOPRIL-HYDROCHLOROTHIAZIDE 20-12.5 MG PO TABS: 1.0000 | ORAL_TABLET | Freq: Every day | ORAL | 0 refills | Status: DC

## 2021-07-21 MED ORDER — METFORMIN HCL 1000 MG PO TABS: 1000.0000 mg | ORAL_TABLET | Freq: Two times a day (BID) | ORAL | 0 refills | Status: DC

## 2021-07-21 NOTE — Assessment & Plan Note (Signed)
Chronic, stable. TSH within normal limits. Continue current levothyroxine dose.  

## 2021-07-21 NOTE — Assessment & Plan Note (Signed)
Chronic, stable. Still follows with doctor at [facility]. Continue collaboration and follow up with worsening symptoms or concerns.

## 2021-07-21 NOTE — Assessment & Plan Note (Signed)
Chronic, stable. Continue crestor. Will check lipid panel 1 week before next visit.

## 2021-07-21 NOTE — Assessment & Plan Note (Signed)
Chronic, stable. Continue current regimen. Refills sent to the pharmacy. Reviewed labs with patient. Kidney and liver function normal. Hemoglobin slightly down, will recheck CBC today.

## 2021-07-21 NOTE — Assessment & Plan Note (Signed)
A1C is 7.7%. Discussed diet and exercise. Will increase ozempic to 1mg  weekly. Refill of jardiance and metformin sent to the pharmacy. Foot exam today with decreased sensation to bottom of feet. Discussed foot care. Follow up in 3 months.

## 2021-07-21 NOTE — Progress Notes (Signed)
Established Patient Office Visit  Subjective:  Patient ID: Shane Preston, male    DOB: 03-29-1961  Age: 60 y.o. MRN: [id number]  CC:  Chief Complaint  Patient presents with   Back Pain    Has been bad for the past few days.    Medication Refill    HPI Shane Preston presents for back pain and follow up on labs.  BACK PAIN  Chronic. Had back surgery several years ago. Still sees doctor at [facility] for back pain. Pain is stable for him.   HYPERTENSION / HYPERLIPIDEMIA  Satisfied with current treatment? yes Duration of hypertension: chronic BP monitoring frequency: daily BP range: 120s/80s BP medication side effects: no Past BP meds: lisinopril-HCTZ Duration of hyperlipidemia: chronic Cholesterol medication side effects: no Cholesterol supplements: none Past cholesterol medications: rosuvastatin (crestor) Medication compliance: excellent compliance Aspirin: yes Recent stressors: no Recurrent headaches: no Visual changes: no Palpitations: no Dyspnea: no Chest pain: intermittent Lower extremity edema: no Dizzy/lightheaded: no  DIABETES  Hypoglycemic episodes:no Polydipsia/polyuria: no Visual disturbance: no Chest pain: yes - intermittent Paresthesias: no Glucose Monitoring: yes  Accucheck frequency: Daily  Fasting glucose: 140s  Post prandial:  Evening:  Before meals: Taking Insulin?: no  Long acting insulin:  Short acting insulin: Blood Pressure Monitoring: daily Retinal Examination:  up to date per patient, requested records Foot Exam: Not up to Date Diabetic Education: Completed Pneumovax: Not up to Date Influenza: Up to Date Aspirin: yes   Past Medical History:  Diagnosis Date   Coronary artery disease    Diabetes mellitus without complication (HCC)    Hemorrhoids    Hyperlipidemia    Hypertension    Lumbar stenosis    Neuropathy    Peripheral vascular disease (HCC)    TIA (transient ischemic attack)    Tinea pedis      Past Surgical History:  Procedure Laterality Date   [place]  10/11/2005   lumbar decompression surgery  10/11/2008    Family History  Family history unknown: Yes    Social History   Socioeconomic History   Marital status: Married    Spouse name: Not on file   Number of children: Not on file   Years of education: Not on file   Highest education level: Not on file  Occupational History   Not on file  Tobacco Use   Smoking status: Never   Smokeless tobacco: Never  Vaping Use   Vaping Use: Never used  Substance and Sexual Activity   Alcohol use: Not Currently   Drug use: Not Currently   Sexual activity: Not Currently  Other Topics Concern   Not on file  Social History Narrative   ** Merged History Encounter **       Social Determinants of Health   Financial Resource Strain: Not on file  Food Insecurity: Not on file  Transportation Needs: Not on file  Physical Activity: Not on file  Stress: Not on file  Social Connections: Not on file  Intimate Partner Violence: Not on file    Outpatient Medications Prior to Visit  Medication Sig Dispense Refill   aspirin EC 81 MG tablet Take 81 mg by mouth daily.     glucose blood test strip 2 each by Other route daily. Use as instructed     levothyroxine (SYNTHROID) 25 MCG tablet Take 25 mcg by mouth every morning.  Magnesium Oxide -Mg Supplement 250 MG TABS Take 1 tablet by mouth daily.     naproxen (NAPROSYN) 500 MG tablet Take 1 tablet (500 mg total) by mouth 2 (two) times daily. 14 tablet 0   nitroGLYCERIN (NITROSTAT) 0.4 MG SL tablet Place 0.4 mg under the tongue every 5 (five) minutes as needed for chest pain.     rosuvastatin (CRESTOR) 10 MG tablet Take 10 mg by mouth daily.     carvedilol (COREG) 25 MG tablet Take 25 mg by mouth 2 (two) times daily.     gabapentin (NEURONTIN) 300 MG capsule Take 1 capsule (300 mg total) by mouth 3 (three) times  daily. 90 capsule 1   JARDIANCE 25 MG TABS tablet Take 1 tablet (25 mg total) by mouth daily. 30 tablet 2   lisinopril-hydrochlorothiazide (ZESTORETIC) 20-12.5 MG tablet Take 1 tablet by mouth daily. 30 tablet 2   metFORMIN (GLUCOPHAGE) 1000 MG tablet Take 1 tablet (1,000 mg total) by mouth 2 (two) times daily. 60 tablet 1   OZEMPIC, 0.25 OR 0.5 MG/DOSE, 2 MG/1.5ML SOPN Inject 0.5 mg into the skin once a week. Monday     No facility-administered medications prior to visit.    No Known Allergies  ROS Review of Systems  Constitutional: Negative.   HENT: Negative.    Respiratory: Negative.    Cardiovascular:  Positive for chest pain (intermittent, resolves with nitro).  Gastrointestinal: Negative.   Genitourinary: Negative.   Musculoskeletal:  Positive for back pain.  Skin: Negative.   Neurological: Negative.   Psychiatric/Behavioral: Negative.       Objective:    Physical Exam Vitals and nursing note reviewed.  Constitutional:      Appearance: Normal appearance. He is obese.  HENT:     Head: Normocephalic.  Eyes:     Conjunctiva/sclera: Conjunctivae normal.  Cardiovascular:     Rate and Rhythm: Normal rate and regular rhythm.     Pulses: Normal pulses.     Heart sounds: Normal heart sounds.  Pulmonary:     Effort: Pulmonary effort is normal.     Breath sounds: Normal breath sounds.  Abdominal:     Palpations: Abdomen is soft.     Tenderness: There is no abdominal tenderness.  Musculoskeletal:     Cervical back: Normal range of motion.  Skin:    General: Skin is warm and dry.  Neurological:     General: No focal deficit present.     Mental Status: He is alert and oriented to person, place, and time.  Psychiatric:        Mood and Affect: Mood normal.        Behavior: Behavior normal.        Thought Content: Thought content normal.        Judgment: Judgment normal.   Diabetic Foot Exam - Simple   Simple Foot Form Visual Inspection No deformities, no  ulcerations, no other skin breakdown bilaterally: Yes Sensation Testing See comments: Yes Pulse Check Posterior Tibialis and Dorsalis pulse intact bilaterally: Yes Comments Skin warm to touch, no breakdown. Unable to feel monofilament on bottoms of feet bilaterally.      BP 107/71   Pulse 60   Temp 98.5 F (36.9 C) (Oral)   Ht 5' 0.98" (1.549 m)   Wt 199 lb 12.8 oz (90.6 kg)   SpO2 99%   BMI 37.77 kg/m  Wt Readings from Last 3 Encounters:  07/21/21 199 lb 12.8 oz (90.6 kg)  05/19/21 200 lb 6  oz (90.9 kg)  05/14/21 201 lb 9.6 oz (91.4 kg)     Health Maintenance Due  Topic Date Due   OPHTHALMOLOGY EXAM  Never done   COVID-19 Vaccine (3 - Booster for Pfizer series) 08/03/2020    There are no preventive care reminders to display for this patient.  Lab Results  Component Value Date   TSH 3.730 07/14/2021   Lab Results  Component Value Date   WBC 6.7 07/14/2021   HGB 12.4 (L) 07/14/2021   HCT 40.4 07/14/2021   MCV 82 07/14/2021   PLT 400 07/14/2021   Lab Results  Component Value Date   NA 138 07/14/2021   K 4.8 07/14/2021   CO2 19 (L) 07/14/2021   GLUCOSE 122 (H) 07/14/2021   BUN 12 07/14/2021   CREATININE 0.99 07/14/2021   BILITOT 0.2 07/14/2021   ALKPHOS 82 07/14/2021   AST 13 07/14/2021   ALT 15 07/14/2021   PROT 7.4 07/14/2021   ALBUMIN 4.7 07/14/2021   CALCIUM 9.5 07/14/2021   ANIONGAP 10 12/09/2019   EGFR 87 07/14/2021   Lab Results  Component Value Date   CHOL 143 05/14/2021   Lab Results  Component Value Date   HDL 41 05/14/2021   Lab Results  Component Value Date   LDLCALC 68 05/14/2021   Lab Results  Component Value Date   TRIG 206 (H) 05/14/2021   Lab Results  Component Value Date   CHOLHDL 3.5 05/14/2021   Lab Results  Component Value Date   HGBA1C 7.7 (H) 07/14/2021      Assessment & Plan:   Problem List Items Addressed This Visit       Cardiovascular and Mediastinum   HTN (hypertension) (Chronic)    Chronic,  stable. Continue current regimen. Refills sent to the pharmacy. Reviewed labs with patient. Kidney and liver function normal. Hemoglobin slightly down, will recheck CBC today.       Relevant Medications   lisinopril-hydrochlorothiazide (ZESTORETIC) 20-12.5 MG tablet   carvedilol (COREG) 25 MG tablet     Endocrine   Acquired hypothyroidism    Chronic, stable. TSH within normal limits. Continue current levothyroxine dose.       Relevant Medications   carvedilol (COREG) 25 MG tablet   Diabetes mellitus without complication (HCC) - Primary    A1C is 7.7%. Discussed diet and exercise. Will increase ozempic to $RemoveBe'1mg'[id number]$  weekly. Refill of jardiance and metformin sent to the pharmacy. Foot exam today with decreased sensation to bottom of feet. Discussed foot care. Follow up in 3 months.       Relevant Medications   lisinopril-hydrochlorothiazide (ZESTORETIC) 20-12.5 MG tablet   metFORMIN (GLUCOPHAGE) 1000 MG tablet   JARDIANCE 25 MG TABS tablet   Semaglutide, 1 MG/DOSE, 4 MG/3ML SOPN   Other Relevant Orders   Bayer DCA Hb A1c Waived   CBC with Differential/Platelet   Basic Metabolic Panel (BMET)     Other   Mixed hyperlipidemia    Chronic, stable. Continue crestor. Will check lipid panel 1 week before next visit.       Relevant Medications   lisinopril-hydrochlorothiazide (ZESTORETIC) 20-12.5 MG tablet   carvedilol (COREG) 25 MG tablet   Other Relevant Orders   Lipid Panel w/o Chol/HDL Ratio   Back pain    Chronic, stable. Still follows with doctor at [facility]. Continue collaboration and follow up with worsening symptoms or concerns.       Other Visit Diagnoses     Low hemoglobin  Hemoglobin 12.4 last week. Will recheck CBC today.    Relevant Orders   CBC   Need for influenza vaccination       Flu vaccine given today   Relevant Orders   Flu Vaccine QUAD 67mo+IM (Fluarix, Fluzone & Alfiuria Quad PF) (Completed)       Meds ordered this encounter  Medications    lisinopril-hydrochlorothiazide (ZESTORETIC) 20-12.5 MG tablet    Sig: Take 1 tablet by mouth daily.    Dispense:  90 tablet    Refill:  0    Dosage decrease   metFORMIN (GLUCOPHAGE) 1000 MG tablet    Sig: Take 1 tablet (1,000 mg total) by mouth 2 (two) times daily.    Dispense:  180 tablet    Refill:  0   JARDIANCE 25 MG TABS tablet    Sig: Take 1 tablet (25 mg total) by mouth daily.    Dispense:  90 tablet    Refill:  0   gabapentin (NEURONTIN) 300 MG capsule    Sig: Take 1 capsule (300 mg total) by mouth 3 (three) times daily.    Dispense:  90 capsule    Refill:  1   carvedilol (COREG) 25 MG tablet    Sig: Take 1 tablet (25 mg total) by mouth 2 (two) times daily.    Dispense:  180 tablet    Refill:  0   Semaglutide, 1 MG/DOSE, 4 MG/3ML SOPN    Sig: Inject 1 mg as directed once a week.    Dispense:  3 mL    Refill:  3     Follow-up: Return in about 3 months (around 10/21/2021).    Charyl Dancer, NP

## 2021-07-22 LAB — CBC
Hematocrit: 41.3 % (ref 37.5–51.0)
Hemoglobin: 12.8 g/dL — ABNORMAL LOW (ref 13.0–17.7)
MCH: 25.4 pg — ABNORMAL LOW (ref 26.6–33.0)
MCHC: 31 g/dL — ABNORMAL LOW (ref 31.5–35.7)
MCV: 82 fL (ref 79–97)
Platelets: 394 10*3/uL (ref 150–450)
RBC: 5.04 x10E6/uL (ref 4.14–5.80)
RDW: 14.2 % (ref 11.6–15.4)
WBC: 7.5 10*3/uL (ref 3.4–10.8)

## 2021-09-01 ENCOUNTER — Ambulatory Visit (INDEPENDENT_AMBULATORY_CARE_PROVIDER_SITE_OTHER): Payer: Medicare HMO | Admitting: *Deleted

## 2021-09-01 DIAGNOSIS — Z0000 Encounter for general adult medical examination without abnormal findings: Secondary | ICD-10-CM | POA: Diagnosis not present

## 2021-09-01 NOTE — Patient Instructions (Signed)
Mr. Shane Preston , Thank you for taking time to come for your Medicare Wellness Visit. I appreciate your ongoing commitment to your health goals. Please review the following plan we discussed and let me know if I can assist you in the future.   Screening recommendations/referrals: Colonoscopy: Education provided Recommended yearly ophthalmology/optometry visit for glaucoma screening and checkup Recommended yearly dental visit for hygiene and checkup  Vaccinations: Influenza vaccine: up to date Pneumococcal vaccine: Education provided Tdap vaccine: Education provided Shingles vaccine: Education provided  Advanced directives: Education provided  Conditions/risks identified:   Next appointment: 10-21-2021 @ 10:00 [place]  Preventive Care 40-64 Years, Male Preventive care refers to lifestyle choices and visits with your health care provider that can promote health and wellness. What does preventive care include? A yearly physical exam. This is also called an annual well check. Dental exams once or twice a year. Routine eye exams. Ask your health care provider how often you should have your eyes checked. Personal lifestyle choices, including: Daily care of your teeth and gums. Regular physical activity. Eating a healthy diet. Avoiding tobacco and drug use. Limiting alcohol use. Practicing safe sex. Taking low-dose aspirin every day starting at age 53. What happens during an annual well check? The services and screenings done by your health care provider during your annual well check will depend on your age, overall health, lifestyle risk factors, and family history of disease. Counseling  Your health care provider may ask you questions about your: Alcohol use. Tobacco use. Drug use. Emotional well-being. Home and relationship well-being. Sexual activity. Eating habits. Work and work Astronomer. Screening  You may have the following tests or measurements: Height, weight, and  BMI. Blood pressure. Lipid and cholesterol levels. These may be checked every 5 years, or more frequently if you are over 31 years old. Skin check. Lung cancer screening. You may have this screening every year starting at age 34 if you have a 30-pack-year history of smoking and currently smoke or have quit within the past 15 years. Fecal occult blood test (FOBT) of the stool. You may have this test every year starting at age 60. Flexible sigmoidoscopy or colonoscopy. You may have a sigmoidoscopy every 5 years or a colonoscopy every 10 years starting at age 80. Prostate cancer screening. Recommendations will vary depending on your family history and other risks. Hepatitis C blood test. Hepatitis B blood test. Sexually transmitted disease (STD) testing. Diabetes screening. This is done by checking your blood sugar (glucose) after you have not eaten for a while (fasting). You may have this done every 1-3 years. Discuss your test results, treatment options, and if necessary, the need for more tests with your health care provider. Vaccines  Your health care provider may recommend certain vaccines, such as: Influenza vaccine. This is recommended every year. Tetanus, diphtheria, and acellular pertussis (Tdap, Td) vaccine. You may need a Td booster every 10 years. Zoster vaccine. You may need this after age 89. Pneumococcal 13-valent conjugate (PCV13) vaccine. You may need this if you have certain conditions and have not been vaccinated. Pneumococcal polysaccharide (PPSV23) vaccine. You may need one or two doses if you smoke cigarettes or if you have certain conditions. Talk to your health care provider about which screenings and vaccines you need and how often you need them. This information is not intended to replace advice given to you by your health care provider. Make sure you discuss any questions you have with your health care provider. Document Released: 10/24/2015 Document  Revised: 06/16/2016  Document Reviewed: 07/29/2015 Elsevier Interactive Patient Education  2017 [employer].  Fall Prevention in the Home Falls can cause injuries. They can happen to people of all ages. There are many things you can do to make your home safe and to help prevent falls. What can I do on the outside of my home? Regularly fix the edges of walkways and driveways and fix any cracks. Remove anything that might make you trip as you walk through a door, such as a raised step or threshold. Trim any bushes or trees on the path to your home. Use bright outdoor lighting. Clear any walking paths of anything that might make someone trip, such as rocks or tools. Regularly check to see if handrails are loose or broken. Make sure that both sides of any steps have handrails. Any raised decks and porches should have guardrails on the edges. Have any leaves, snow, or ice cleared regularly. Use sand or salt on walking paths during winter. Clean up any spills in your garage right away. This includes oil or grease spills. What can I do in the bathroom? Use night lights. Install grab bars by the toilet and in the tub and shower. Do not use towel bars as grab bars. Use non-skid mats or decals in the tub or shower. If you need to sit down in the shower, use a plastic, non-slip stool. Keep the floor dry. Clean up any water that spills on the floor as soon as it happens. Remove soap buildup in the tub or shower regularly. Attach bath mats securely with double-sided non-slip rug tape. Do not have throw rugs and other things on the floor that can make you trip. What can I do in the bedroom? Use night lights. Make sure that you have a light by your bed that is easy to reach. Do not use any sheets or blankets that are too big for your bed. They should not hang down onto the floor. Have a firm chair that has side arms. You can use this for support while you get dressed. Do not have throw rugs and other things on the floor  that can make you trip. What can I do in the kitchen? Clean up any spills right away. Avoid walking on wet floors. Keep items that you use a lot in easy-to-reach places. If you need to reach something above you, use a strong step stool that has a grab bar. Keep electrical cords out of the way. Do not use floor polish or wax that makes floors slippery. If you must use wax, use non-skid floor wax. Do not have throw rugs and other things on the floor that can make you trip. What can I do with my stairs? Do not leave any items on the stairs. Make sure that there are handrails on both sides of the stairs and use them. Fix handrails that are broken or loose. Make sure that handrails are as long as the stairways. Check any carpeting to make sure that it is firmly attached to the stairs. Fix any carpet that is loose or worn. Avoid having throw rugs at the top or bottom of the stairs. If you do have throw rugs, attach them to the floor with carpet tape. Make sure that you have a light switch at the top of the stairs and the bottom of the stairs. If you do not have them, ask someone to add them for you. What else can I do to help prevent falls? Wear  shoes that: Do not have high heels. Have rubber bottoms. Are comfortable and fit you well. Are closed at the toe. Do not wear sandals. If you use a stepladder: Make sure that it is fully opened. Do not climb a closed stepladder. Make sure that both sides of the stepladder are locked into place. Ask someone to hold it for you, if possible. Clearly mark and make sure that you can see: Any grab bars or handrails. First and last steps. Where the edge of each step is. Use tools that help you move around (mobility aids) if they are needed. These include: Canes. Walkers. Scooters. Crutches. Turn on the lights when you go into a dark area. Replace any light bulbs as soon as they burn out. Set up your furniture so you have a clear path. Avoid moving your  furniture around. If any of your floors are uneven, fix them. If there are any pets around you, be aware of where they are. Review your medicines with your doctor. Some medicines can make you feel dizzy. This can increase your chance of falling. Ask your doctor what other things that you can do to help prevent falls. This information is not intended to replace advice given to you by your health care provider. Make sure you discuss any questions you have with your health care provider. Document Released: 07/24/2009 Document Revised: 03/04/2016 Document Reviewed: 11/01/2014 Elsevier Interactive Patient Education  2017 [employer].

## 2021-09-01 NOTE — Progress Notes (Signed)
Subjective:   Shane Preston is a 61 y.o. male who presents for Medicare Annual/Subsequent preventive examination.  I connected with  Delora Fuel Jacobo on 09/01/21 by a video enabled telemedicine application and verified that I am speaking with the correct person using two identifiers.   I discussed the limitations of evaluation and management by telemedicine. The patient expressed understanding and agreed to proceed.  Patient location: home Provider location:tele-health   not in office    Review of Systems     Cardiac Risk Factors include: advanced age (>63men, >87 women);diabetes mellitus;hypertension;male gender;obesity (BMI >30kg/m2)     Objective:    Today's Vitals   There is no height or weight on file to calculate BMI.  Advanced Directives 09/01/2021 12/09/2019 05/09/2015  Does Patient Have a Medical Advance Directive? No No No  Would patient like information on creating a medical advance directive? No - Patient declined No - Patient declined No - patient declined information    Current Medications (verified) Outpatient Encounter Medications as of 09/01/2021  Medication Sig   aspirin EC 81 MG tablet Take 81 mg by mouth daily.   carvedilol (COREG) 25 MG tablet Take 1 tablet (25 mg total) by mouth 2 (two) times daily.   gabapentin (NEURONTIN) 300 MG capsule Take 1 capsule (300 mg total) by mouth 3 (three) times daily.   glucose blood test strip 2 each by Other route daily. Use as instructed   JARDIANCE 25 MG TABS tablet Take 1 tablet (25 mg total) by mouth daily.   levothyroxine (SYNTHROID) 25 MCG tablet Take 25 mcg by mouth every morning.   lisinopril-hydrochlorothiazide (ZESTORETIC) 20-12.5 MG tablet Take 1 tablet by mouth daily.   Magnesium Oxide -Mg Supplement 250 MG TABS Take 1 tablet by mouth daily.   metFORMIN (GLUCOPHAGE) 1000 MG tablet Take 1 tablet (1,000 mg total) by mouth 2 (two) times daily.   naproxen (NAPROSYN) 500 MG tablet Take 1 tablet  (500 mg total) by mouth 2 (two) times daily.   nitroGLYCERIN (NITROSTAT) 0.4 MG SL tablet Place 0.4 mg under the tongue every 5 (five) minutes as needed for chest pain.   rosuvastatin (CRESTOR) 10 MG tablet Take 10 mg by mouth daily.   Semaglutide, 1 MG/DOSE, 4 MG/3ML SOPN Inject 1 mg as directed once a week.   No facility-administered encounter medications on file as of 09/01/2021.    Allergies (verified) Patient has no known allergies.   History: Past Medical History:  Diagnosis Date   Coronary artery disease    Diabetes mellitus without complication (HCC)    Hemorrhoids    Hyperlipidemia    Hypertension    Lumbar stenosis    Neuropathy    Peripheral vascular disease (HCC)    TIA (transient ischemic attack)    Tinea pedis    Past Surgical History:  Procedure Laterality Date   CARDIAC CATHETERIZATION     [facility]    EYE SURGERY     HERNIA REPAIR  10/11/2005   lumbar decompression surgery  10/11/2008   Family History  Family history unknown: Yes   Social History   Socioeconomic History   Marital status: Married    Spouse name: Not on file   Number of children: Not on file   Years of education: Not on file   Highest education level: Not on file  Occupational History   Not on file  Tobacco Use   Smoking status: Never   Smokeless tobacco: Never  Vaping Use   Vaping  Use: Never used  Substance and Sexual Activity   Alcohol use: Not Currently   Drug use: Not Currently   Sexual activity: Not Currently  Other Topics Concern   Not on file  Social History Narrative   ** Merged History Encounter **       Social Determinants of Health   Financial Resource Strain: Low Risk    Difficulty of Paying Living Expenses: Not hard at all  Food Insecurity: No Food Insecurity   Worried About Programme researcher, broadcasting/film/video in the Last Year: Never true   Ran Out of Food in the Last Year: Never true  Transportation Needs: No Transportation Needs   Lack of Transportation (Medical):  No   Lack of Transportation (Non-Medical): No  Physical Activity: Inactive   Days of Exercise per Week: 0 days   Minutes of Exercise per Session: 0 min  Stress: No Stress Concern Present   Feeling of Stress : Not at all  Social Connections: Moderately Isolated   Frequency of Communication with Friends and Family: More than three times a week   Frequency of Social Gatherings with Friends and Family: More than three times a week   Attends Religious Services: Never   Database administrator or Organizations: No   Attends Engineer, structural: Never   Marital Status: Married    Tobacco Counseling Counseling given: Not Answered   Clinical Intake:  Pre-visit preparation completed: Yes  Pain : No/denies pain     Nutritional Risks: None Diabetes: Yes CBG done?: No Did pt. bring in CBG monitor from home?: No  How often do you need to have someone help you when you read instructions, pamphlets, or other written materials from your doctor or pharmacy?: 1 - Never  Diabetic?  Yes   Nutrition Risk Assessment:  Has the patient had any N/V/D within the last 2 months?  No  Does the patient have any non-healing wounds?  No  Has the patient had any unintentional weight loss or weight gain?  No   Diabetes:  Is the patient diabetic?  Yes  If diabetic, was a CBG obtained today?  No  Did the patient bring in their glucometer from home?  No  How often do you monitor your CBG's? 2x daily.   Financial Strains and Diabetes Management:  Are you having any financial strains with the device, your supplies or your medication? No .  Does the patient want to be seen by Chronic Care Management for management of their diabetes?  No  Would the patient like to be referred to a Nutritionist or for Diabetic Management?  No   Diabetic Exams:  Diabetic Eye Exam: completed per patient unsure of dates. Overdue for diabetic eye exam. Pt has been advised about the importance in completing this  exam.  Diabetic Foot Exam:. Pt has been advised about the importance in completing this exam.  Interpreter Needed?: No  Information entered by :: Remi Haggard LPN   Activities of Daily Living In your present state of health, do you have any difficulty performing the following activities: 09/01/2021  Hearing? N  Vision? N  Difficulty concentrating or making decisions? N  Walking or climbing stairs? N  Dressing or bathing? N  Doing errands, shopping? N  Preparing Food and eating ? N  Using the Toilet? N  In the past six months, have you accidently leaked urine? N  Do you have problems with loss of bowel control? N  Managing your Medications? N  Managing your Finances? N  Housekeeping or managing your Housekeeping? N  Some recent data might be hidden    Patient Care Team: [name], MD as PCP - General (Internal Medicine) Leanna Sato, MD (Family Medicine)  Indicate any recent Medical Services you may have received from other than [facility] providers in the past year (date may be approximate).     Assessment:   This is a routine wellness examination for [facility]  Hearing/Vision screen Hearing Screening - Comments:: No trouble hearing Vision Screening - Comments:: [place]  unsure of name Up to date  Dietary issues and exercise activities discussed: Current Exercise Habits: The patient does not participate in regular exercise at present, Exercise limited by: None identified   Goals Addressed             This Visit's Progress    DIET - EAT MORE FRUITS AND VEGETABLES       Eat healthier       Depression Screen PHQ 2/9 Scores 09/01/2021 07/21/2021 05/14/2021  PHQ - 2 Score 0 0 0  PHQ- 9 Score - - 0    Fall Risk Fall Risk  09/01/2021 07/21/2021 05/14/2021  Falls in the past year? 0 0 0  Number falls in past yr: 0 0 0  Injury with Fall? 0 0 0  Risk for fall due to : - No Fall Risks No Fall Risks  Follow up Falls evaluation completed Falls evaluation completed  Falls evaluation completed    FALL RISK PREVENTION PERTAINING TO THE HOME:  Any stairs in or around the home? Yes  If so, are there any without handrails? No  Home free of loose throw rugs in walkways, pet beds, electrical cords, etc? Yes  Adequate lighting in your home to reduce risk of falls? Yes   ASSISTIVE DEVICES UTILIZED TO PREVENT FALLS:  Life alert? No  Use of a cane, walker or w/c? Yes  Grab bars in the bathroom? No  Shower chair or bench in shower? No  Elevated toilet seat or a handicapped toilet? No   TIMED UP AND GO:  Was the test performed? No .    Cognitive Function:  Normal cognitive status assessed by direct observation by this Nurse Health Advisor. No abnormalities found.          Immunizations Immunization History  Administered Date(s) Administered   Influenza,inj,Quad PF,6+ Mos 07/21/2021    TDAP status: Due, Education has been provided regarding the importance of this vaccine. Advised may receive this vaccine at local pharmacy or Health Dept. Aware to provide a copy of the vaccination record if obtained from local pharmacy or Health Dept. Verbalized acceptance and understanding.  Flu Vaccine status: Up to date  Pneumococcal vaccine status: Due, Education has been provided regarding the importance of this vaccine. Advised may receive this vaccine at local pharmacy or Health Dept. Aware to provide a copy of the vaccination record if obtained from local pharmacy or Health Dept. Verbalized acceptance and understanding.  Covid-19 vaccine status: Declined, Education has been provided regarding the importance of this vaccine but patient still declined. Advised may receive this vaccine at local pharmacy or Health Dept.or vaccine clinic. Aware to provide a copy of the vaccination record if obtained from local pharmacy or Health Dept. Verbalized acceptance and understanding.  Qualifies for Shingles Vaccine? Yes   Zostavax completed No   Shingrix Completed?: No.     Education has been provided regarding the importance of this vaccine. Patient has been advised to  call insurance company to determine out of pocket expense if they have not yet received this vaccine. Advised may also receive vaccine at local pharmacy or Health Dept. Verbalized acceptance and understanding.  Screening Tests Health Maintenance  Topic Date Due   OPHTHALMOLOGY EXAM  Never done   COVID-19 Vaccine (3 - Booster for Pfizer series) 09/17/2021 (Originally 04/28/2020)   Zoster Vaccines- Shingrix (1 of 2) 12/02/2021 (Originally 12/08/2009)   FOOT EXAM  05/14/2022 (Originally 12/08/1969)   COLONOSCOPY (Pts 45-56yrs Insurance coverage will need to be confirmed)  05/14/2022 (Originally 12/08/2004)   TETANUS/TDAP  05/14/2022 (Originally 12/08/1978)   Hepatitis C Screening  05/14/2022 (Originally 12/08/1977)   HIV Screening  05/14/2022 (Originally 12/08/1974)   Pneumococcal Vaccine 70-11 Years old (1 - PCV) 09/01/2022 (Originally 12/08/1965)   HEMOGLOBIN A1C  01/12/2022   INFLUENZA VACCINE  Completed   HPV VACCINES  Aged Out    Health Maintenance  Health Maintenance Due  Topic Date Due   OPHTHALMOLOGY EXAM  Never done    Colonoscopy  :  patient declined  Lung Cancer Screening: (Low Dose CT Chest recommended if Age 6-80 years, 30 pack-year currently smoking OR have quit w/in 15years.) qualify.   Lung Cancer Screening Referral:   Additional Screening:  Hepatitis C Screening: qualify;   Vision Screening: Recommended annual ophthalmology exams for early detection of glaucoma and other disorders of the eye. Is the patient up to date with their annual eye exam?   Who is the provider or what is the name of the office in which the patient attends annual eye exams? [facility] If pt is not established with a provider, would they like to be referred to a provider to establish care? No .   Dental Screening: Recommended annual dental exams for proper oral hygiene  Community Resource  Referral / Chronic Care Management: CRR required this visit?  No   CCM required this visit?  No      Plan:     I have personally reviewed and noted the following in the patient's chart:   Medical and social history Use of alcohol, tobacco or illicit drugs  Current medications and supplements including opioid prescriptions. Patient is not currently taking opioid prescriptions. Functional ability and status Nutritional status Physical activity Advanced directives List of other physicians Hospitalizations, surgeries, and ER visits in previous 12 months Vitals Screenings to include cognitive, depression, and falls Referrals and appointments  In addition, I have reviewed and discussed with patient certain preventive protocols, quality metrics, and best practice recommendations. A written personalized care plan for preventive services as well as general preventive health recommendations were provided to patient.     Remi Haggard, LPN   [id number]   Nurse Notes:

## 2021-10-13 ENCOUNTER — Ambulatory Visit (INDEPENDENT_AMBULATORY_CARE_PROVIDER_SITE_OTHER): Payer: Medicare HMO | Admitting: Internal Medicine

## 2021-10-13 ENCOUNTER — Encounter: Payer: Self-pay | Admitting: Internal Medicine

## 2021-10-13 ENCOUNTER — Other Ambulatory Visit: Payer: Self-pay

## 2021-10-13 VITALS — BP 102/66 | HR 70 | Temp 98.5°F | Ht 60.98 in | Wt 200.0 lb

## 2021-10-13 DIAGNOSIS — K625 Hemorrhage of anus and rectum: Secondary | ICD-10-CM | POA: Diagnosis not present

## 2021-10-13 DIAGNOSIS — H9193 Unspecified hearing loss, bilateral: Secondary | ICD-10-CM

## 2021-10-13 LAB — CBC WITH DIFFERENTIAL/PLATELET
Hematocrit: 31 % — ABNORMAL LOW (ref 37.5–51.0)
Hemoglobin: 10.4 g/dL — ABNORMAL LOW (ref 13.0–17.7)
Lymphocytes Absolute: 2.1 10*3/uL (ref 0.7–3.1)
Lymphs: 31 %
MCH: 26.8 pg (ref 26.6–33.0)
MCHC: 33.5 g/dL (ref 31.5–35.7)
MCV: 80 fL (ref 79–97)
MID (Absolute): 0.6 10*3/uL (ref 0.1–1.6)
MID: 9 %
Neutrophils Absolute: 4.1 10*3/uL (ref 1.4–7.0)
Neutrophils: 60 %
Platelets: 325 10*3/uL (ref 150–450)
RBC: 3.88 x10E6/uL — ABNORMAL LOW (ref 4.14–5.80)
RDW: 18.4 % — ABNORMAL HIGH (ref 11.6–15.4)
WBC: 6.8 10*3/uL (ref 3.4–10.8)

## 2021-10-13 NOTE — Progress Notes (Signed)
BP 102/66    Pulse 70    Temp 98.5 F (36.9 C) (Oral)    Ht 5' 0.98" (1.549 m)    Wt 200 lb (90.7 kg)    SpO2 98%    BMI 37.81 kg/m    Subjective:    Patient ID: Shane Preston, male    DOB: 03-Apr-1961, 61 y.o.   MRN: [id number]  Chief Complaint  Patient presents with   Rectal Bleeding    Was seen at [facility] ER on Sunday, needs to F/U, they said to see PCP for referral for Colonoscopy. Patient states he is feeling bloated/    HPI: Shane Preston is a 61 y.o. male  Pt is here for a follow up on BRBPR he says he went to [facility] and wast seen by them as he wasn't a member there.  He went to  the [facility] - labs were done which showed a Hb of 11 , Vitals were stable. Per rectal exam done he was GUAIC POSITIVE. He DID NOT have any CT scans of his abdomen/ pelvis as he was asked to fu as Outpatient.  Pt is currently asymptomatic   Rectal Bleeding  Episode onset: bleeding started friday morning 6 times a day and 3 times at night since friday. . The problem has been unchanged. Pertinent negatives include no anorexia, no fever, no abdominal pain, no diarrhea, no hematemesis, no hemorrhoids, no nausea, no rectal pain, no vomiting, no hematuria, no vaginal bleeding, no vaginal discharge, no chest pain, no headaches, no coughing, no difficulty breathing and no rash.   Chief Complaint  Patient presents with   Rectal Bleeding    Was seen at [facility] ER on Sunday, needs to F/U, they said to see PCP for referral for Colonoscopy. Patient states he is feeling bloated/    Relevant past medical, surgical, family and social history reviewed and updated as indicated. Interim medical history since our last visit reviewed. Allergies and medications reviewed and updated.  Review of Systems  Constitutional:  Negative for fever.  Respiratory:  Negative for cough.   Cardiovascular:  Negative for chest pain.  Gastrointestinal:  Positive for hematochezia. Negative for abdominal pain, anorexia,  diarrhea, hematemesis, hemorrhoids, nausea, rectal pain and vomiting.  Genitourinary:  Negative for hematuria, vaginal bleeding and vaginal discharge.  Skin:  Negative for rash.  Neurological:  Negative for headaches.   Per HPI unless specifically indicated above     Objective:    BP 102/66    Pulse 70    Temp 98.5 F (36.9 C) (Oral)    Ht 5' 0.98" (1.549 m)    Wt 200 lb (90.7 kg)    SpO2 98%    BMI 37.81 kg/m   Wt Readings from Last 3 Encounters:  10/30/21 200 lb (90.7 kg)  10/28/21 200 lb 1.6 oz (90.8 kg)  10/21/21 204 lb 3.2 oz (92.6 kg)    Physical Exam Vitals and nursing note reviewed.  Constitutional:      General: He is not in acute distress.    Appearance: Normal appearance. He is not ill-appearing or diaphoretic.  HENT:     Head: Normocephalic and atraumatic.     Right Ear: Tympanic membrane and external ear normal. There is no impacted cerumen.     Left Ear: External ear normal.     Nose: No congestion or rhinorrhea.     Mouth/Throat:     Pharynx: No oropharyngeal exudate or posterior oropharyngeal erythema.  Eyes:  Conjunctiva/sclera: Conjunctivae normal.     Pupils: Pupils are equal, round, and reactive to light.  Cardiovascular:     Rate and Rhythm: Normal rate and regular rhythm.     Heart sounds: No murmur heard.   No friction rub. No gallop.  Pulmonary:     Effort: No respiratory distress.     Breath sounds: No stridor. No wheezing or rhonchi.  Chest:     Chest wall: No tenderness.  Abdominal:     General: Abdomen is flat. Bowel sounds are normal.     Palpations: Abdomen is soft. There is no mass.     Tenderness: There is no abdominal tenderness.  Musculoskeletal:        General: No tenderness.     Cervical back: Normal range of motion and neck supple. No rigidity or tenderness.     Left lower leg: No edema.  Skin:    General: Skin is warm and dry.  Neurological:     Mental Status: He is alert.  Psychiatric:        Mood and Affect: Mood  normal.        Behavior: Behavior normal.        Thought Content: Thought content normal.        Judgment: Judgment normal.    Results for orders placed or performed in visit on 10/13/21  CBC With Differential/Platelet  Result Value Ref Range   WBC 6.8 3.4 - 10.8 x10E3/uL   RBC 3.88 (L) 4.14 - 5.80 x10E6/uL   Hemoglobin 10.4 (L) 13.0 - 17.7 g/dL   Hematocrit 97.7 (L) 41.4 - 51.0 %   MCV 80 79 - 97 fL   MCH 26.8 26.6 - 33.0 pg   MCHC 33.5 31.5 - 35.7 g/dL   RDW 23.9 (H) 53.2 - 02.3 %   Platelets 325 150 - 450 x10E3/uL   Neutrophils 60 Not Estab. %   Lymphs 31 Not Estab. %   MID 9 Not Estab. %   Neutrophils Absolute 4.1 1.4 - 7.0 x10E3/uL   Lymphocytes Absolute 2.1 0.7 - 3.1 x10E3/uL   MID (Absolute) 0.6 0.1 - 1.6 X10E3/uL        Current Outpatient Medications:    glucose blood test strip, 2 each by Other route daily. Use as instructed, Disp: , Rfl:    Magnesium Oxide -Mg Supplement 250 MG TABS, Take 1 tablet by mouth daily., Disp: , Rfl:    nitroGLYCERIN (NITROSTAT) 0.4 MG SL tablet, Place 0.4 mg under the tongue every 5 (five) minutes as needed for chest pain. (Patient not taking: Reported on 10/28/2021), Disp: , Rfl:    rosuvastatin (CRESTOR) 10 MG tablet, Take 10 mg by mouth daily., Disp: , Rfl:    Semaglutide, 1 MG/DOSE, 4 MG/3ML SOPN, Inject 1 mg as directed once a week., Disp: 3 mL, Rfl: 3   carvedilol (COREG) 25 MG tablet, Take 1 tablet (25 mg total) by mouth 2 (two) times daily., Disp: 180 tablet, Rfl: 0   gabapentin (NEURONTIN) 300 MG capsule, Take 1 capsule (300 mg total) by mouth 3 (three) times daily., Disp: 90 capsule, Rfl: 1   Iron, Ferrous Sulfate, 325 (65 Fe) MG TABS, Take 325 mg by mouth in the morning and at bedtime., Disp: 60 tablet, Rfl: 2   JARDIANCE 25 MG TABS tablet, Take 1 tablet (25 mg total) by mouth daily., Disp: 90 tablet, Rfl: 0   levothyroxine (SYNTHROID) 25 MCG tablet, Take 1 tablet (25 mcg total) by mouth every morning., Disp:  90 tablet, Rfl: 3    lisinopril-hydrochlorothiazide (ZESTORETIC) 20-12.5 MG tablet, Take 1 tablet by mouth daily., Disp: 90 tablet, Rfl: 0   metFORMIN (GLUCOPHAGE) 1000 MG tablet, Take 1 tablet (1,000 mg total) by mouth 2 (two) times daily., Disp: 180 tablet, Rfl: 0    Assessment & Plan:  BRBPR ? Since Friday  ?  Sec to diverticular bleed vs hemorrhoids vs upper GI bleeding no CT done @ ER per notes.  Will need to see GI ASAP an urgent referral placed  Trying to cordinate care with GI at this point.  Consider CT abdomen/ pelvis.    Hearing loss will refer to ENT.   Problem List Items Addressed This Visit       Digestive   Rectal bleeding - Primary     Nervous and Auditory   Bilateral hearing loss   Relevant Orders   Ambulatory referral to ENT     Orders Placed This Encounter  Procedures   CBC With Differential/Platelet   Ambulatory referral to Gastroenterology   Ambulatory referral to ENT     No orders of the defined types were placed in this encounter.    Follow up plan: No follow-ups on file.

## 2021-10-14 ENCOUNTER — Other Ambulatory Visit: Payer: Self-pay

## 2021-10-14 ENCOUNTER — Ambulatory Visit (INDEPENDENT_AMBULATORY_CARE_PROVIDER_SITE_OTHER): Payer: Medicare HMO | Admitting: Gastroenterology

## 2021-10-14 ENCOUNTER — Encounter: Payer: Self-pay | Admitting: Gastroenterology

## 2021-10-14 VITALS — BP 109/63 | HR 65 | Temp 98.2°F | Ht 60.98 in | Wt 198.4 lb

## 2021-10-14 DIAGNOSIS — D62 Acute posthemorrhagic anemia: Secondary | ICD-10-CM | POA: Diagnosis not present

## 2021-10-14 DIAGNOSIS — K625 Hemorrhage of anus and rectum: Secondary | ICD-10-CM

## 2021-10-14 MED ORDER — NA SULFATE-K SULFATE-MG SULF 17.5-3.13-1.6 GM/177ML PO SOLN: 354.0000 mL | Freq: Once | ORAL | 0 refills | Status: AC

## 2021-10-14 NOTE — Progress Notes (Signed)
[name], MD [address]  [address]  [place], [place] [address]  Main: [phone]  Fax: [phone]    Gastroenterology Consultation  Referring Provider:     [name], MD Primary Care Physician:  [name], MD Primary Gastroenterologist:  Dr. [name] Reason for Consultation:     Rectal bleeding        HPI:   Shane Preston is a 61 y.o. male referred by Dr. [name], MD  for consultation & management of rectal bleeding.  Patient was given an urgent appointment secondary to painless rectal bleeding that started on Friday last week.  He reports that the bright red blood has been filling the toilet bowl.  He denies any constipation or diarrhea.  He spends about a minute to use the bathroom.  Patient denies any rectal pain.  He was seen by PCP yesterday, hemoglobin was 10.4, dropped from 12.8 since 07/21/2021.  Patient denies any abdominal pain, bloating.  He reports that he had similar episode about 4 or 5 years ago, underwent colonoscopy in [place] and was reportedly normal.  Patient does not work, he denies any prolonged sitting or standing.  He denies lifting weights.  He is not on any blood thinners  NSAIDs: None  Antiplts/Anticoagulants/Anti thrombotics: None  GI Procedures: Colonoscopy about 4 to 5 years ago, reportedly normal  Past Medical History:  Diagnosis Date   Coronary artery disease    Diabetes mellitus without complication (HCC)    Hemorrhoids    Hyperlipidemia    Hypertension    Lumbar stenosis    Neuropathy    Peripheral vascular disease (HCC)    TIA (transient ischemic attack)    Tinea pedis     Past Surgical History:  Procedure Laterality Date   CARDIAC CATHETERIZATION     [facility]    EYE SURGERY     HERNIA REPAIR  10/11/2005   lumbar decompression surgery  10/11/2008    Current Outpatient Medications:    carvedilol (COREG) 25 MG tablet, Take 1 tablet (25 mg total) by mouth 2 (two) times daily., Disp:  180 tablet, Rfl: 0   gabapentin (NEURONTIN) 300 MG capsule, Take 1 capsule (300 mg total) by mouth 3 (three) times daily., Disp: 90 capsule, Rfl: 1   glucose blood test strip, 2 each by Other route daily. Use as instructed, Disp: , Rfl:    JARDIANCE 25 MG TABS tablet, Take 1 tablet (25 mg total) by mouth daily., Disp: 90 tablet, Rfl: 0   levothyroxine (SYNTHROID) 25 MCG tablet, Take 25 mcg by mouth every morning., Disp: , Rfl:    lisinopril-hydrochlorothiazide (ZESTORETIC) 20-12.5 MG tablet, Take 1 tablet by mouth daily., Disp: 90 tablet, Rfl: 0   Magnesium Oxide -Mg Supplement 250 MG TABS, Take 1 tablet by mouth daily., Disp: , Rfl:    metFORMIN (GLUCOPHAGE) 1000 MG tablet, Take 1 tablet (1,000 mg total) by mouth 2 (two) times daily., Disp: 180 tablet, Rfl: 0   Na Sulfate-K Sulfate-Mg Sulf 17.5-3.13-1.6 GM/177ML SOLN, Take 354 mLs by mouth once for 1 dose., Disp: 354 mL, Rfl: 0   naproxen (NAPROSYN) 500 MG tablet, Take 1 tablet (500 mg total) by mouth 2 (two) times daily., Disp: 14 tablet, Rfl: 0   nitroGLYCERIN (NITROSTAT) 0.4 MG SL tablet, Place 0.4 mg under the tongue every 5 (five) minutes as needed for chest pain., Disp: , Rfl:    rosuvastatin (CRESTOR) 10 MG tablet, Take 10 mg by mouth daily., Disp: ,  Rfl:    Semaglutide, 1 MG/DOSE, 4 MG/3ML SOPN, Inject 1 mg as directed once a week., Disp: 3 mL, Rfl: 3  Family History  Family history unknown: Yes     Social History   Tobacco Use   Smoking status: Never   Smokeless tobacco: Never  Vaping Use   Vaping Use: Never used  Substance Use Topics   Alcohol use: Not Currently   Drug use: Not Currently    Allergies as of 10/14/2021   (No Known Allergies)    Review of Systems:    All systems reviewed and negative except where noted in HPI.   Physical Exam:  BP 109/63 (BP Location: Left Arm, Patient Position: Sitting, Cuff Size: Normal)    Pulse 65    Temp 98.2 F (36.8 C) (Oral)    Ht 5' 0.98" (1.549 m)    Wt 198 lb 6 oz (90 kg)     BMI 37.51 kg/m  No LMP for male patient.  General:   Alert,  Well-developed, well-nourished, pleasant and cooperative in NAD Head:  Normocephalic and atraumatic. Eyes:  Sclera clear, no icterus.   Conjunctiva pink. Ears:  Normal auditory acuity. Nose:  No deformity, discharge, or lesions. Mouth:  No deformity or lesions,oropharynx pink & moist. Neck:  Supple; no masses or thyromegaly. Lungs:  Respirations even and unlabored.  Clear throughout to auscultation.   No wheezes, crackles, or rhonchi. No acute distress. Heart:  Regular rate and rhythm; no murmurs, clicks, rubs, or gallops. Abdomen:  Normal bowel sounds. Soft, non-tender and non-distended without masses, hepatosplenomegaly or hernias noted.  No guarding or rebound tenderness.   Rectal: Normal perianal exam, anoscopy revealed prolapsed internal hemorrhoids, scant amount of old blood in the anal canal.  Nontender digital rectal exam Msk:  Symmetrical without gross deformities. Good, equal movement & strength bilaterally. Pulses:  Normal pulses noted. Extremities:  No clubbing or edema.  No cyanosis. Neurologic:  Alert and oriented x3;  grossly normal neurologically. Skin:  Intact without significant lesions or rashes. No jaundice. Psych:  Alert and cooperative. Normal mood and affect.  Imaging Studies: None  Assessment and Plan:   Shane Preston is a 61 y.o. Hispanic male with metabolic syndrome is seen in consultation for 5 days history of painless rectal bleeding resulting in acute blood loss anemia.  Most likely patient is suffering from bleeding secondary to internal hemorrhoids based on endoscopy today.  We will proceed with hemorrhoid ligation.  I explained risks and benefits of the procedure, consent obtained.  I also recommend diagnostic colonoscopy given that he had a colonoscopy about 4 to 5 years ago Check iron panel, B12 and folate levels today  Follow up in 3 weeks for repeat banding   [name],  MD

## 2021-10-14 NOTE — Progress Notes (Signed)
PROCEDURE NOTE: The patient presents with symptomatic grade 2 hemorrhoids, unresponsive to maximal medical therapy, requesting rubber band ligation of his/her hemorrhoidal disease.  All risks, benefits and alternative forms of therapy were described and informed consent was obtained.  In the Left Lateral Decubitus position (if anoscopy is performed) anoscopic examination revealed grade 2 hemorrhoids in the all position(s).   The decision was made to band the RP internal hemorrhoid, and the [facility] System was used to perform band ligation without complication.  Digital anorectal examination was then performed to assure proper positioning of the band, and to adjust the banded tissue as required.  The patient was discharged home without pain or other issues.  Dietary and behavioral recommendations were given and (if necessary - prescriptions were given), along with follow-up instructions.  The patient will return 3 weeks for follow-up and possible additional banding as required.  No complications were encountered and the patient tolerated the procedure well.    

## 2021-10-15 ENCOUNTER — Other Ambulatory Visit: Payer: Self-pay | Admitting: Internal Medicine

## 2021-10-15 ENCOUNTER — Other Ambulatory Visit: Payer: Self-pay | Admitting: Nurse Practitioner

## 2021-10-15 ENCOUNTER — Telehealth: Payer: Self-pay

## 2021-10-15 LAB — IRON,TIBC AND FERRITIN PANEL
Ferritin: 8 ng/mL — ABNORMAL LOW (ref 30–400)
Iron Saturation: 7 % — CL (ref 15–55)
Iron: 29 ug/dL — ABNORMAL LOW (ref 38–169)
Total Iron Binding Capacity: 417 ug/dL (ref 250–450)
UIBC: 388 ug/dL — ABNORMAL HIGH (ref 111–343)

## 2021-10-15 LAB — B12 AND FOLATE PANEL
Folate: >20 ng/mL (ref >3.0)
Vitamin B-12: 412 pg/mL (ref 232–1245)

## 2021-10-15 MED ORDER — FUSION PLUS PO CAPS: 1.0000 | ORAL_CAPSULE | Freq: Every day | ORAL | 0 refills | Status: DC

## 2021-10-15 MED ORDER — GOLYTELY 236 G PO SOLR: 4000.0000 mL | Freq: Once | ORAL | 0 refills | Status: AC

## 2021-10-15 NOTE — Telephone Encounter (Signed)
Requested Prescriptions  Pending Prescriptions Disp Refills   levothyroxine (SYNTHROID) 25 MCG tablet [Pharmacy Med Name: LEVOTHYROXINE 25 MCG TABLET] 90 tablet 0    Sig: TAKE 1 TABLET ONCE A DAY     Endocrinology:  Hypothyroid Agents Failed - 10/15/2021 12:27 PM      Failed - TSH needs to be rechecked within 3 months after an abnormal result. Refill until TSH is due.      Passed - TSH in normal range and within 360 days    TSH  Date Value Ref Range Status  07/14/2021 3.730 0.450 - 4.500 uIU/mL Final         Passed - Valid encounter within last 12 months    Recent Outpatient Visits          2 days ago BRBPR (bright red blood per rectum)   [facility] Vigg, Avanti, MD   2 months ago Diabetes mellitus without complication (HCC)   [name] McElwee, Lauren A, NP   5 months ago Back pain, unspecified back location, unspecified back pain laterality, unspecified chronicity   [name] Vigg, Avanti, MD      Future Appointments            In 6 days Vigg, Avanti, MD [facility]            Accu-Chek Softclix Lancets lancets [employer] Med Name: ACCU-CHEK SOFTCLIX LANCETS] 100 each 0    Sig: USE 1 LANCET THREE TIMES A DAY     Endocrinology: Diabetes - Testing Supplies Passed - 10/15/2021 12:27 PM      Passed - Valid encounter within last 12 months    Recent Outpatient Visits          2 days ago BRBPR (bright red blood per rectum)   [facility] Vigg, Avanti, MD   2 months ago Diabetes mellitus without complication (HCC)   [name] McElwee, Lauren A, NP   5 months ago Back pain, unspecified back location, unspecified back pain laterality, unspecified chronicity   [name] Vigg, Avanti, MD      Future Appointments            In 6 days Vigg, Avanti, MD [facility]

## 2021-10-15 NOTE — Telephone Encounter (Signed)
-----   Message from Toney Reil, MD sent at 10/15/2021  8:12 AM EST ----- He has severe iron deficiency, please have him pick up fusion plus samples for a month supply and start taking one pill daily  RV

## 2021-10-15 NOTE — Telephone Encounter (Signed)
Patient verbalized understanding of instructions. He would like the Fusion plus sent to the pharmacy. Patient also states the prep for the colonoscopy is not covered by his insurance. Sent Golytely to the pharmacy. And informed patient instructions on how to do the prep

## 2021-10-15 NOTE — Telephone Encounter (Addendum)
Patient stopped in office to request a RX for levothyroxine (SYNTHROID) 25 MCG tablet and lancets to check glucose. Patient request that prescription be sent to [facility].    May need to call patient's previous provider at [facility] for RX details [phone]. Had patient complete MR release if needed.  Please call patient to advise if medications are sent in at [phone].

## 2021-10-16 ENCOUNTER — Telehealth: Payer: Self-pay

## 2021-10-16 MED ORDER — LEVOTHYROXINE SODIUM 25 MCG PO TABS
25.0000 ug | ORAL_TABLET | Freq: Every morning | ORAL | 3 refills | Status: DC
Start: 2021-10-16 — End: 2022-10-20

## 2021-10-16 NOTE — Addendum Note (Signed)
Addended by: Dorcas Carrow on: 10/16/2021 02:35 PM   Modules accepted: Orders

## 2021-10-16 NOTE — Addendum Note (Signed)
Addended by: Leward Quan A on: 10/16/2021 02:02 PM   Modules accepted: Orders

## 2021-10-16 NOTE — Telephone Encounter (Signed)
error 

## 2021-10-16 NOTE — Telephone Encounter (Signed)
Normal thyroid lab in October. Years supply of medicine sent to pharmacy.

## 2021-10-20 ENCOUNTER — Ambulatory Visit: Payer: Medicare HMO | Admitting: Internal Medicine

## 2021-10-21 ENCOUNTER — Ambulatory Visit (INDEPENDENT_AMBULATORY_CARE_PROVIDER_SITE_OTHER): Payer: Medicare HMO | Admitting: Internal Medicine

## 2021-10-21 ENCOUNTER — Encounter: Payer: Self-pay | Admitting: Internal Medicine

## 2021-10-21 ENCOUNTER — Other Ambulatory Visit: Payer: Self-pay

## 2021-10-21 VITALS — BP 97/63 | HR 65 | Temp 98.3°F | Ht 61.02 in | Wt 204.2 lb

## 2021-10-21 DIAGNOSIS — D649 Anemia, unspecified: Secondary | ICD-10-CM | POA: Diagnosis not present

## 2021-10-21 DIAGNOSIS — E119 Type 2 diabetes mellitus without complications: Secondary | ICD-10-CM | POA: Diagnosis not present

## 2021-10-21 LAB — CBC WITH DIFFERENTIAL/PLATELET
Hematocrit: 30.8 % — ABNORMAL LOW (ref 37.5–51.0)
Hemoglobin: 9.8 g/dL — ABNORMAL LOW (ref 13.0–17.7)
Lymphocytes Absolute: 2.2 10*3/uL (ref 0.7–3.1)
Lymphs: 27 %
MCH: 25.9 pg — ABNORMAL LOW (ref 26.6–33.0)
MCHC: 31.8 g/dL (ref 31.5–35.7)
MCV: 81 fL (ref 79–97)
MID (Absolute): 0.7 10*3/uL (ref 0.1–1.6)
MID: 8 %
Neutrophils Absolute: 5.1 10*3/uL (ref 1.4–7.0)
Neutrophils: 64 %
Platelets: 441 10*3/uL (ref 150–450)
RBC: 3.79 x10E6/uL — ABNORMAL LOW (ref 4.14–5.80)
RDW: 18.2 % — ABNORMAL HIGH (ref 11.6–15.4)
WBC: 8 10*3/uL (ref 3.4–10.8)

## 2021-10-21 LAB — BAYER DCA HB A1C WAIVED: HB A1C (BAYER DCA - WAIVED): 7.1 % — ABNORMAL HIGH (ref 4.8–5.6)

## 2021-10-21 MED ORDER — JARDIANCE 25 MG PO TABS: 25.0000 mg | ORAL_TABLET | Freq: Every day | ORAL | 0 refills | Status: DC

## 2021-10-21 MED ORDER — CARVEDILOL 25 MG PO TABS: 25.0000 mg | ORAL_TABLET | Freq: Two times a day (BID) | ORAL | 0 refills | Status: DC

## 2021-10-21 MED ORDER — LISINOPRIL-HYDROCHLOROTHIAZIDE 20-12.5 MG PO TABS: 1.0000 | ORAL_TABLET | Freq: Every day | ORAL | 0 refills | Status: DC

## 2021-10-21 MED ORDER — GABAPENTIN 300 MG PO CAPS: 300.0000 mg | ORAL_CAPSULE | Freq: Three times a day (TID) | ORAL | 1 refills | Status: DC

## 2021-10-21 MED ORDER — IRON (FERROUS SULFATE) 325 (65 FE) MG PO TABS
325.0000 mg | ORAL_TABLET | Freq: Two times a day (BID) | ORAL | 2 refills | Status: DC
Start: 2021-10-21 — End: 2022-02-11

## 2021-10-21 MED ORDER — METFORMIN HCL 1000 MG PO TABS: 1000.0000 mg | ORAL_TABLET | Freq: Two times a day (BID) | ORAL | 0 refills | Status: DC

## 2021-10-21 NOTE — Progress Notes (Signed)
BP 97/63    Pulse 65    Temp 98.3 F (36.8 C) (Oral)    Ht 5' 1.02" (1.55 m)    Wt 204 lb 3.2 oz (92.6 kg)    SpO2 98%    BMI 38.55 kg/m    Subjective:    Patient ID: Shane Preston, male    DOB: [birth year], 61 y.o.   MRN: [id number]  Chief Complaint  Patient presents with   Diabetes    HPI: Shane Preston is a 61 y.o. male  Diabetes Pertinent negatives for hypoglycemia include no confusion or pallor. Pertinent negatives for diabetes include no weight loss.  Anemia Presents for follow-up visit. There has been no abdominal pain, anorexia, bruising/bleeding easily, confusion, fever, leg swelling, light-headedness, malaise/fatigue, pallor, palpitations, paresthesias, pica or weight loss.   Chief Complaint  Patient presents with   Diabetes    Relevant past medical, surgical, family and social history reviewed and updated as indicated. Interim medical history since our last visit reviewed. Allergies and medications reviewed and updated.  Review of Systems  Constitutional:  Negative for fever, malaise/fatigue and weight loss.  Cardiovascular:  Negative for palpitations.  Gastrointestinal:  Negative for abdominal pain and anorexia.  Skin:  Negative for pallor.  Neurological:  Negative for light-headedness and paresthesias.  Hematological:  Does not bruise/bleed easily.  Psychiatric/Behavioral:  Negative for confusion.    Per HPI unless specifically indicated above     Objective:    BP 97/63    Pulse 65    Temp 98.3 F (36.8 C) (Oral)    Ht 5' 1.02" (1.55 m)    Wt 204 lb 3.2 oz (92.6 kg)    SpO2 98%    BMI 38.55 kg/m   Wt Readings from Last 3 Encounters:  10/21/21 204 lb 3.2 oz (92.6 kg)  10/14/21 198 lb 6 oz (90 kg)  10/13/21 200 lb (90.7 kg)    Physical Exam Vitals and nursing note reviewed.  Constitutional:      General: He is not in acute distress.    Appearance: Normal appearance. He is not ill-appearing or diaphoretic.  Cardiovascular:      Rate and Rhythm: Normal rate and regular rhythm.     Heart sounds: No murmur heard.   No friction rub. No gallop.  Pulmonary:     Breath sounds: No wheezing.  Abdominal:     General: Bowel sounds are normal.     Palpations: Abdomen is soft.     Tenderness: There is no abdominal tenderness.  Musculoskeletal:     Cervical back: Normal range of motion and neck supple. No rigidity or tenderness.     Left lower leg: No edema.  Neurological:     Mental Status: He is alert.  Psychiatric:        Mood and Affect: Mood normal.        Behavior: Behavior normal.        Thought Content: Thought content normal.    Results for orders placed or performed in visit on 10/21/21  Bayer DCA Hb A1c Waived (STAT)  Result Value Ref Range   HB A1C (BAYER DCA - WAIVED) 7.1 (H) 4.8 - 5.6 %  CBC With Differential/Platelet  Result Value Ref Range   WBC 8.0 3.4 - 10.8 x10E3/uL   RBC 3.79 (L) 4.14 - 5.80 x10E6/uL   Hemoglobin 9.8 (L) 13.0 - 17.7 g/dL   Hematocrit 30.8 (L) 37.5 - 51.0 %   MCV 81 79 - 97 fL  MCH 25.9 (L) 26.6 - 33.0 pg   MCHC 31.8 31.5 - 35.7 g/dL   RDW 18.2 (H) 11.6 - 15.4 %   Platelets 441 150 - 450 x10E3/uL   Neutrophils 64 Not Estab. %   Lymphs 27 Not Estab. %   MID 8 Not Estab. %   Neutrophils Absolute 5.1 1.4 - 7.0 x10E3/uL   Lymphocytes Absolute 2.2 0.7 - 3.1 x10E3/uL   MID (Absolute) 0.7 0.1 - 1.6 X10E3/uL        Current Outpatient Medications:    glucose blood test strip, 2 each by Other route daily. Use as instructed, Disp: , Rfl:    Iron, Ferrous Sulfate, 325 (65 Fe) MG TABS, Take 325 mg by mouth in the morning and at bedtime., Disp: 60 tablet, Rfl: 2   levothyroxine (SYNTHROID) 25 MCG tablet, Take 1 tablet (25 mcg total) by mouth every morning., Disp: 90 tablet, Rfl: 3   Magnesium Oxide -Mg Supplement 250 MG TABS, Take 1 tablet by mouth daily., Disp: , Rfl:    naproxen (NAPROSYN) 500 MG tablet, Take 1 tablet (500 mg total) by mouth 2 (two) times daily., Disp: 14 tablet,  Rfl: 0   nitroGLYCERIN (NITROSTAT) 0.4 MG SL tablet, Place 0.4 mg under the tongue every 5 (five) minutes as needed for chest pain., Disp: , Rfl:    rosuvastatin (CRESTOR) 10 MG tablet, Take 10 mg by mouth daily., Disp: , Rfl:    Semaglutide, 1 MG/DOSE, 4 MG/3ML SOPN, Inject 1 mg as directed once a week., Disp: 3 mL, Rfl: 3   carvedilol (COREG) 25 MG tablet, Take 1 tablet (25 mg total) by mouth 2 (two) times daily., Disp: 180 tablet, Rfl: 0   gabapentin (NEURONTIN) 300 MG capsule, Take 1 capsule (300 mg total) by mouth 3 (three) times daily., Disp: 90 capsule, Rfl: 1   JARDIANCE 25 MG TABS tablet, Take 1 tablet (25 mg total) by mouth daily., Disp: 90 tablet, Rfl: 0   lisinopril-hydrochlorothiazide (ZESTORETIC) 20-12.5 MG tablet, Take 1 tablet by mouth daily., Disp: 90 tablet, Rfl: 0   metFORMIN (GLUCOPHAGE) 1000 MG tablet, Take 1 tablet (1,000 mg total) by mouth 2 (two) times daily., Disp: 180 tablet, Rfl: 0    Assessment & Plan:   symptomatic grade 2 hemorrhoids s/p band ligation per Dr Marius Ditch appreciate input.  To have a csope - next week. To fu with GI x 3 weeks for follow-up and possible additional banding as required.  Hearing loss:  To see ENT for hearing loss.    Anemia  Recheck labs  CBC today  Iron wu x 4 weeks.  Considr Heme onc referral if labs abnl still  Needs to take iron replacements bid  Problem List Items Addressed This Visit       Endocrine   Diabetes mellitus without complication (HCC)   Relevant Medications   lisinopril-hydrochlorothiazide (ZESTORETIC) 20-12.5 MG tablet   JARDIANCE 25 MG TABS tablet   metFORMIN (GLUCOPHAGE) 1000 MG tablet   Other Relevant Orders   Bayer DCA Hb A1c Waived (STAT) (Completed)   CBC With Differential/Platelet   Bayer DCA Hb A1c Waived   Lipid panel   Basic metabolic panel   Lipid panel   Urinalysis, Routine w reflex microscopic   Microalbumin, Urine Waived     Other   Anemia - Primary   Relevant Medications   Iron,  Ferrous Sulfate, 325 (65 Fe) MG TABS   Other Relevant Orders   Vitamin B12   Folate   CBC  with Differential/Platelet   Ferritin   Iron and TIBC   TSH   Bayer DCA Hb A1c Waived (STAT) (Completed)   CBC With Differential/Platelet   Bayer DCA Hb A1c Waived   Lipid panel   Basic metabolic panel   Lipid panel   Urinalysis, Routine w reflex microscopic   Microalbumin, Urine Waived   Ambulatory referral to Hematology / Oncology     Orders Placed This Encounter  Procedures   Vitamin B12   Folate   CBC with Differential/Platelet   Ferritin   Iron and TIBC   TSH   Bayer DCA Hb A1c Waived (STAT)   CBC With Differential/Platelet   Bayer DCA Hb A1c Waived   Lipid panel   Basic metabolic panel   Lipid panel   Urinalysis, Routine w reflex microscopic   Microalbumin, Urine Waived   CBC With Differential/Platelet   Ambulatory referral to Hematology / Oncology     Meds ordered this encounter  Medications   lisinopril-hydrochlorothiazide (ZESTORETIC) 20-12.5 MG tablet    Sig: Take 1 tablet by mouth daily.    Dispense:  90 tablet    Refill:  0    Dosage decrease   carvedilol (COREG) 25 MG tablet    Sig: Take 1 tablet (25 mg total) by mouth 2 (two) times daily.    Dispense:  180 tablet    Refill:  0   JARDIANCE 25 MG TABS tablet    Sig: Take 1 tablet (25 mg total) by mouth daily.    Dispense:  90 tablet    Refill:  0   metFORMIN (GLUCOPHAGE) 1000 MG tablet    Sig: Take 1 tablet (1,000 mg total) by mouth 2 (two) times daily.    Dispense:  180 tablet    Refill:  0   gabapentin (NEURONTIN) 300 MG capsule    Sig: Take 1 capsule (300 mg total) by mouth 3 (three) times daily.    Dispense:  90 capsule    Refill:  1   Iron, Ferrous Sulfate, 325 (65 Fe) MG TABS    Sig: Take 325 mg by mouth in the morning and at bedtime.    Dispense:  60 tablet    Refill:  2     Follow up plan: No follow-ups on file.

## 2021-10-21 NOTE — Telephone Encounter (Signed)
error 

## 2021-10-23 ENCOUNTER — Encounter: Payer: Self-pay | Admitting: *Deleted

## 2021-10-28 ENCOUNTER — Other Ambulatory Visit: Payer: Self-pay

## 2021-10-28 ENCOUNTER — Inpatient Hospital Stay: Payer: Medicare HMO

## 2021-10-28 ENCOUNTER — Inpatient Hospital Stay: Payer: Medicare HMO | Attending: Oncology | Admitting: Oncology

## 2021-10-28 ENCOUNTER — Encounter: Payer: Self-pay | Admitting: Oncology

## 2021-10-28 VITALS — BP 96/58 | HR 66 | Temp 97.3°F | Resp 18 | Wt 200.1 lb

## 2021-10-28 DIAGNOSIS — K625 Hemorrhage of anus and rectum: Secondary | ICD-10-CM | POA: Insufficient documentation

## 2021-10-28 DIAGNOSIS — H9193 Unspecified hearing loss, bilateral: Secondary | ICD-10-CM | POA: Insufficient documentation

## 2021-10-28 DIAGNOSIS — D500 Iron deficiency anemia secondary to blood loss (chronic): Secondary | ICD-10-CM | POA: Diagnosis present

## 2021-10-28 DIAGNOSIS — D509 Iron deficiency anemia, unspecified: Secondary | ICD-10-CM | POA: Insufficient documentation

## 2021-10-28 NOTE — Progress Notes (Signed)
Pt here to establish care for anemia. Interpreter present during chart review.

## 2021-10-28 NOTE — Progress Notes (Signed)
Hematology/Oncology Consult note [phone] [phone]      Patient Care Team: [name], MD as PCP - General (Internal Medicine) Leanna Sato, MD (Family Medicine) Rickard Patience, MD as Consulting Physician (Hematology and Oncology) Toney Reil, MD as Consulting Physician (Gastroenterology) Iran Ouch, MD as Consulting Physician (Cardiology)  REFERRING PROVIDER: [name], MD  CHIEF COMPLAINTS/REASON FOR VISIT:  Evaluation of anemia  HISTORY OF PRESENTING ILLNESS:   Shane Preston is a  61 y.o.  male with PMH listed below was seen in consultation at the request of  Vigg, Avanti, MD  for evaluation of anemia 10/13/2021, hemoglobin 10.4, MCV 80. 10/14/2021, iron panel showed iron saturation 7, ferritin 8, TIBC 417. 10/21/2021, patient had a CBC done which showed a hemoglobin of 9.8, MCV 81. Review previous lab results.  Anemia is new onset, since October 2022. Patient reports painless rectal bleeding.  Has been referred to establish care with gastroenterology Dr. Allegra Lai and has a colonoscopy scheduled.  Denies any unintentional weight loss, change of bowel habits.  Denies any family history of colon cancer. + Fatigue  Review of Systems  Constitutional:  Positive for fatigue. Negative for appetite change, chills, fever and unexpected weight change.  HENT:   Negative for hearing loss and voice change.   Eyes:  Negative for eye problems and icterus.  Respiratory:  Negative for chest tightness, cough and shortness of breath.   Cardiovascular:  Negative for chest pain and leg swelling.  Gastrointestinal:  Positive for blood in stool. Negative for abdominal distention and abdominal pain.  Endocrine: Negative for hot flashes.  Genitourinary:  Negative for difficulty urinating, dysuria and frequency.   Musculoskeletal:  Negative for arthralgias.  Skin:  Negative for itching and rash.  Neurological:  Negative for light-headedness and numbness.   Hematological:  Negative for adenopathy. Does not bruise/bleed easily.  Psychiatric/Behavioral:  Negative for confusion.    MEDICAL HISTORY:  Past Medical History:  Diagnosis Date   Anemia    Coronary artery disease    Diabetes mellitus without complication (HCC)    Hemorrhoids    Hyperlipidemia    Hypertension    Lumbar stenosis    Neuropathy    Peripheral vascular disease (HCC)    TIA (transient ischemic attack)    Tinea pedis     SURGICAL HISTORY: Past Surgical History:  Procedure Laterality Date   CARDIAC CATHETERIZATION     [facility]    EYE SURGERY     HERNIA REPAIR  10/11/2005   lumbar decompression surgery  10/11/2008    SOCIAL HISTORY: Social History   Socioeconomic History   Marital status: Married    Spouse name: Not on file   Number of children: Not on file   Years of education: Not on file   Highest education level: Not on file  Occupational History   Not on file  Tobacco Use   Smoking status: Never   Smokeless tobacco: Never  Vaping Use   Vaping Use: Never used  Substance and Sexual Activity   Alcohol use: Not Currently   Drug use: Not Currently   Sexual activity: Not Currently  Other Topics Concern   Not on file  Social History Narrative   ** Merged History Encounter **       Social Determinants of Health   Financial Resource Strain: Low Risk    Difficulty of Paying Living Expenses: Not hard at all  Food Insecurity: No Food Insecurity   Worried About Running Out  of Food in the Last Year: Never true   Ran Out of Food in the Last Year: Never true  Transportation Needs: No Transportation Needs   Lack of Transportation (Medical): No   Lack of Transportation (Non-Medical): No  Physical Activity: Inactive   Days of Exercise per Week: 0 days   Minutes of Exercise per Session: 0 min  Stress: No Stress Concern Present   Feeling of Stress : Not at all  Social Connections: Moderately Isolated   Frequency of Communication with Friends and  Family: More than three times a week   Frequency of Social Gatherings with Friends and Family: More than three times a week   Attends Religious Services: Never   Database administrator or Organizations: No   Attends Engineer, structural: Never   Marital Status: Married  Catering manager Violence: Not At Risk   Fear of Current or Ex-Partner: No   Emotionally Abused: No   Physically Abused: No   Sexually Abused: No    FAMILY HISTORY: Family History  Family history unknown: Yes    ALLERGIES:  has No Known Allergies.  MEDICATIONS:  Current Outpatient Medications  Medication Sig Dispense Refill   carvedilol (COREG) 25 MG tablet Take 1 tablet (25 mg total) by mouth 2 (two) times daily. 180 tablet 0   gabapentin (NEURONTIN) 300 MG capsule Take 1 capsule (300 mg total) by mouth 3 (three) times daily. 90 capsule 1   glucose blood test strip 2 each by Other route daily. Use as instructed     Iron, Ferrous Sulfate, 325 (65 Fe) MG TABS Take 325 mg by mouth in the morning and at bedtime. 60 tablet 2   JARDIANCE 25 MG TABS tablet Take 1 tablet (25 mg total) by mouth daily. 90 tablet 0   levothyroxine (SYNTHROID) 25 MCG tablet Take 1 tablet (25 mcg total) by mouth every morning. 90 tablet 3   lisinopril-hydrochlorothiazide (ZESTORETIC) 20-12.5 MG tablet Take 1 tablet by mouth daily. 90 tablet 0   Magnesium Oxide -Mg Supplement 250 MG TABS Take 1 tablet by mouth daily.     metFORMIN (GLUCOPHAGE) 1000 MG tablet Take 1 tablet (1,000 mg total) by mouth 2 (two) times daily. 180 tablet 0   rosuvastatin (CRESTOR) 10 MG tablet Take 10 mg by mouth daily.     Semaglutide, 1 MG/DOSE, 4 MG/3ML SOPN Inject 1 mg as directed once a week. 3 mL 3   nitroGLYCERIN (NITROSTAT) 0.4 MG SL tablet Place 0.4 mg under the tongue every 5 (five) minutes as needed for chest pain. (Patient not taking: Reported on 10/28/2021)     No current facility-administered medications for this visit.     PHYSICAL  EXAMINATION: ECOG PERFORMANCE STATUS: 1 - Symptomatic but completely ambulatory Vitals:   10/28/21 0955  BP: (!) 96/58  Pulse: 66  Resp: 18  Temp: (!) 97.3 F (36.3 C)   Filed Weights   10/28/21 0955  Weight: 200 lb 1.6 oz (90.8 kg)    Physical Exam Constitutional:      General: He is not in acute distress. HENT:     Head: Normocephalic and atraumatic.  Eyes:     General: No scleral icterus. Cardiovascular:     Rate and Rhythm: Normal rate and regular rhythm.     Heart sounds: Normal heart sounds.  Pulmonary:     Effort: Pulmonary effort is normal. No respiratory distress.     Breath sounds: No wheezing.  Abdominal:     General: Bowel  sounds are normal. There is no distension.     Palpations: Abdomen is soft.  Musculoskeletal:        General: No deformity. Normal range of motion.     Cervical back: Normal range of motion and neck supple.  Skin:    General: Skin is warm and dry.     Findings: No erythema or rash.  Neurological:     Mental Status: He is alert and oriented to person, place, and time. Mental status is at baseline.     Cranial Nerves: No cranial nerve deficit.     Coordination: Coordination normal.  Psychiatric:        Mood and Affect: Mood normal.    LABORATORY DATA:  I have reviewed the data as listed Lab Results  Component Value Date   WBC 8.0 10/21/2021   HGB 9.8 (L) 10/21/2021   HCT 30.8 (L) 10/21/2021   MCV 81 10/21/2021   PLT 441 10/21/2021   Recent Labs    05/14/21 1047 07/14/21 0819  NA 138 138  K 4.8 4.8  CL 101 100  CO2 23 19*  GLUCOSE 118* 122*  BUN 13 12  CREATININE 1.08 0.99  CALCIUM 9.6 9.5  PROT  --  7.4  ALBUMIN  --  4.7  AST  --  13  ALT  --  15  ALKPHOS  --  82  BILITOT  --  0.2   Iron/TIBC/Ferritin/ %Sat    Component Value Date/Time   IRON 29 (L) 10/14/2021 1315   TIBC 417 10/14/2021 1315   FERRITIN 8 (L) 10/14/2021 1315   IRONPCTSAT 7 (LL) 10/14/2021 1315      RADIOGRAPHIC STUDIES: I have personally  reviewed the radiological images as listed and agreed with the findings in the report. No results found.    ASSESSMENT & PLAN:  1. BRBPR (bright red blood per rectum)   2. Iron deficiency anemia due to chronic blood loss    Labs reviewed and discussed with patient.  Anemia is likely secondary to iron deficiency anemia. I discussed about options of oral iron supplementation versus IV Venofer treatments. Patient agrees with IV Venofer treatments Plan IV iron with Venofer 200mg  weekly x 4 doses. Allergy reactions/infusion reaction including anaphylactic reaction discussed with patient. Other side effects include but not limited to high blood pressure, headache, wheezing skin rash, weight gain, leg swelling, etc. Patient voices understanding and willing to proceed.  Rectal bleeding, follow-up with gastroenterology for colonoscopy evaluation.   Orders Placed This Encounter  Procedures   CBC with Differential/Platelet    Standing Status:   Future    Standing Expiration Date:   10/28/2022   Ferritin    Standing Status:   Future    Standing Expiration Date:   10/28/2022   Iron and TIBC    Standing Status:   Future    Standing Expiration Date:   10/28/2022    All questions were answered. The patient knows to call the clinic with any problems questions or concerns.  cc Vigg, Avanti, MD    Return of visit: Follow-up in 3 months for repeat iron labs and evaluation of need of additional IV Venofer treatments. Thank you for this kind referral and the opportunity to participate in the care of this patient. A copy of today's note is routed to referring provider   Rickard PatienceZhou Farhaan Mabee, MD, PhD [facility] 10/28/2021

## 2021-10-30 ENCOUNTER — Ambulatory Visit: Payer: Medicare HMO | Admitting: Anesthesiology

## 2021-10-30 ENCOUNTER — Encounter
Admission: RE | Disposition: A | Discharge status: Home / Self Care | Payer: Self-pay | Source: Home / Self Care | Attending: Gastroenterology

## 2021-10-30 ENCOUNTER — Ambulatory Visit
Admission: RE | Admit: 2021-10-30 | Discharge: 2021-10-30 | Disposition: A | Discharge status: Home / Self Care | Payer: Medicare HMO | Attending: Gastroenterology | Admitting: Gastroenterology

## 2021-10-30 DIAGNOSIS — K640 First degree hemorrhoids: Secondary | ICD-10-CM

## 2021-10-30 DIAGNOSIS — K625 Hemorrhage of anus and rectum: Secondary | ICD-10-CM | POA: Diagnosis present

## 2021-10-30 DIAGNOSIS — E1151 Type 2 diabetes mellitus with diabetic peripheral angiopathy without gangrene: Secondary | ICD-10-CM | POA: Diagnosis not present

## 2021-10-30 DIAGNOSIS — E039 Hypothyroidism, unspecified: Secondary | ICD-10-CM | POA: Insufficient documentation

## 2021-10-30 DIAGNOSIS — K648 Other hemorrhoids: Secondary | ICD-10-CM | POA: Insufficient documentation

## 2021-10-30 DIAGNOSIS — I10 Essential (primary) hypertension: Secondary | ICD-10-CM | POA: Diagnosis not present

## 2021-10-30 DIAGNOSIS — Z8673 Personal history of transient ischemic attack (TIA), and cerebral infarction without residual deficits: Secondary | ICD-10-CM | POA: Diagnosis not present

## 2021-10-30 DIAGNOSIS — K626 Ulcer of anus and rectum: Secondary | ICD-10-CM | POA: Insufficient documentation

## 2021-10-30 DIAGNOSIS — K644 Residual hemorrhoidal skin tags: Secondary | ICD-10-CM | POA: Diagnosis not present

## 2021-10-30 DIAGNOSIS — I2510 Atherosclerotic heart disease of native coronary artery without angina pectoris: Secondary | ICD-10-CM | POA: Insufficient documentation

## 2021-10-30 DIAGNOSIS — E785 Hyperlipidemia, unspecified: Secondary | ICD-10-CM | POA: Insufficient documentation

## 2021-10-30 HISTORY — PX: COLONOSCOPY WITH PROPOFOL: SHX5780

## 2021-10-30 LAB — GLUCOSE, CAPILLARY: Glucose-Capillary: 111 mg/dL — ABNORMAL HIGH (ref 70–99)

## 2021-10-30 SURGERY — COLONOSCOPY WITH PROPOFOL
Anesthesia: General

## 2021-10-30 MED ORDER — LIDOCAINE HCL (PF) 2 % IJ SOLN
INTRAMUSCULAR | Status: AC
  Filled 2021-10-30: qty 5

## 2021-10-30 MED ORDER — SODIUM CHLORIDE 0.9 % IV SOLN
INTRAVENOUS | Status: DC
  Administered 2021-10-30: 20 mL/h via INTRAVENOUS

## 2021-10-30 MED ORDER — PROPOFOL 10 MG/ML IV BOLUS
INTRAVENOUS | Status: DC | PRN
  Administered 2021-10-30: 100 mg via INTRAVENOUS

## 2021-10-30 MED ORDER — PROPOFOL 500 MG/50ML IV EMUL
INTRAVENOUS | Status: DC | PRN
  Administered 2021-10-30: 200 ug/kg/min via INTRAVENOUS

## 2021-10-30 MED ORDER — PROPOFOL 500 MG/50ML IV EMUL
INTRAVENOUS | Status: AC
  Filled 2021-10-30: qty 50

## 2021-10-30 MED ORDER — LIDOCAINE 2% (20 MG/ML) 5 ML SYRINGE
INTRAMUSCULAR | Status: DC | PRN
  Administered 2021-10-30: 50 mg via INTRAVENOUS

## 2021-10-30 NOTE — Transfer of Care (Signed)
Immediate Anesthesia Transfer of Care Note  Patient: Shane Preston  Procedure(s) Performed: COLONOSCOPY WITH PROPOFOL  Patient Location: PACU  Anesthesia Type:General  Level of Consciousness: sedated  Airway & Oxygen Therapy: Patient Spontanous Breathing  Post-op Assessment: Report given to RN and Post -op Vital signs reviewed and stable  Post vital signs: Reviewed and stable  Last Vitals:  Vitals Value Taken Time  BP    Temp    Pulse 67 10/30/21 0929  Resp 23 10/30/21 0929  SpO2 95 % 10/30/21 0929  Vitals shown include unvalidated device data.  Last Pain:  Vitals:   10/30/21 0837  TempSrc: Temporal  PainSc: 6          Complications: No notable events documented.

## 2021-10-30 NOTE — Op Note (Signed)
[facility] Patient Name: Shane Preston [facility] Procedure Date: 10/30/2021 8:54 AM MRN: [id number] Account #: [id number] Date of Birth: 10-May-1961 Admit Type: Outpatient Age: 61 Room: [facility] ENDO ROOM 2 Gender: Male Note Status: Finalized Instrument Name: Colonoscope [id number] Procedure:             Colonoscopy Indications:           This is the patient's first colonoscopy, Rectal                         bleeding Providers:             Toney Reil MD, MD Medicines:             General Anesthesia Complications:         No immediate complications. Estimated blood loss: None. Procedure:             Pre-Anesthesia Assessment:                        - Prior to the procedure, a History and Physical was                         performed, and patient medications and allergies were                         reviewed. The patient is competent. The risks and                         benefits of the procedure and the sedation options and                         risks were discussed with the patient. All questions                         were answered and informed consent was obtained.                         Patient identification and proposed procedure were                         verified by the physician, the nurse, the                         anesthesiologist, the anesthetist and the technician                         in the pre-procedure area in the procedure room in the                         endoscopy suite. Mental Status Examination: alert and                         oriented. Airway Examination: normal oropharyngeal                         airway and neck mobility. Respiratory Examination:                         clear to auscultation. CV Examination:  normal.                         Prophylactic Antibiotics: The patient does not require                         prophylactic antibiotics. Prior Anticoagulants: The                         patient has  taken no previous anticoagulant or                         antiplatelet agents. ASA Grade Assessment: III - A                         patient with severe systemic disease. After reviewing                         the risks and benefits, the patient was deemed in                         satisfactory condition to undergo the procedure. The                         anesthesia plan was to use general anesthesia.                         Immediately prior to administration of medications,                         the patient was re-assessed for adequacy to receive                         sedatives. The heart rate, respiratory rate, oxygen                         saturations, blood pressure, adequacy of pulmonary                         ventilation, and response to care were monitored                         throughout the procedure. The physical status of the                         patient was re-assessed after the procedure.                        After obtaining informed consent, the colonoscope was                         passed under direct vision. Throughout the procedure,                         the patient's blood pressure, pulse, and oxygen                         saturations were monitored continuously. The  Colonoscope was introduced through the anus and                         advanced to the the cecum, identified by appendiceal                         orifice and ileocecal valve. The colonoscopy was                         performed with moderate difficulty due to inadequate                         bowel prep, significant looping and the patient's body                         habitus. Successful completion of the procedure was                         aided by applying abdominal pressure. The patient                         tolerated the procedure well. The quality of the bowel                         preparation was poor. Findings:      The perianal and digital  rectal examinations were normal. Pertinent       negatives include normal sphincter tone and no palpable rectal lesions.      Copious quantities of semi-liquid stool was found in the entire colon,       precluding visualization.      A single (solitary) eight mm post banding ulcer with a band was found in       the distal rectum. No bleeding was present. No stigmata of recent       bleeding were seen.      Non-bleeding external and internal hemorrhoids were found during       retroflexion. The hemorrhoids were large. Impression:            - Preparation of the colon was poor.                        - Stool in the entire examined colon.                        - A single (solitary) ulcer in the distal rectum.                        - Non-bleeding external and internal hemorrhoids.                        - No specimens collected. Recommendation:        - Repeat colonoscopy in 6 months with 2 days prep                         because the bowel preparation was poor.                        - Return to my office as previously scheduled. Procedure Code(s):     ---  Professional ---                        [phone] [id number], Colonoscopy, flexible; diagnostic, including                         collection of specimen(s) by brushing or washing, when                         performed (separate procedure) Diagnosis Code(s):     --- Professional ---                        K64.8, Other hemorrhoids                        K62.6, Ulcer of anus and rectum                        K62.5, Hemorrhage of anus and rectum CPT copyright 2019 [employer]. All rights reserved. The codes documented in this report are preliminary and upon coder review may  be revised to meet current compliance requirements. Dr. Libby Mawonini Romero Letizia Toney Reilohini Reddy Reena Borromeo MD, MD 10/30/2021 9:27:19 AM This report has been signed electronically. Number of Addenda: 0 Note Initiated On: 10/30/2021 8:54 AM Scope Withdrawal Time: 0 hours 7 minutes  29 seconds  Total Procedure Duration: 0 hours 16 minutes 24 seconds  Estimated Blood Loss:  Estimated blood loss: none.      [facility]

## 2021-10-30 NOTE — H&P (Signed)
Shane Repress, MD [address]  [address]  [place], [place] [address]  Main: [phone]  Fax: [phone] Pager: [phone]  Primary Care Physician:  [name], MD Primary Gastroenterologist:  Dr. Arlyss Preston  Pre-Procedure History & Physical: HPI:  Shane Preston is a 61 y.o. male is here for an colonoscopy.   Past Medical History:  Diagnosis Date   Anemia    Coronary artery disease    Diabetes mellitus without complication (HCC)    Hemorrhoids    Hyperlipidemia    Hypertension    Lumbar stenosis    Neuropathy    Peripheral vascular disease (HCC)    TIA (transient ischemic attack)    Tinea pedis     Past Surgical History:  Procedure Laterality Date   CARDIAC CATHETERIZATION     [facility]    EYE SURGERY     HERNIA REPAIR  10/11/2005   lumbar decompression surgery  10/11/2008    Prior to Admission medications   Medication Sig Start Date End Date Taking? Authorizing Provider  carvedilol (COREG) 25 MG tablet Take 1 tablet (25 mg total) by mouth 2 (two) times daily. 10/21/21   Vigg, Avanti, MD  gabapentin (NEURONTIN) 300 MG capsule Take 1 capsule (300 mg total) by mouth 3 (three) times daily. 10/21/21 12/20/21  Vigg, Avanti, MD  glucose blood test strip 2 each by Other route daily. Use as instructed    [provider]  Iron, Ferrous Sulfate, 325 (65 Fe) MG TABS Take 325 mg by mouth in the morning and at bedtime. 10/21/21   Vigg, Avanti, MD  JARDIANCE 25 MG TABS tablet Take 1 tablet (25 mg total) by mouth daily. 10/21/21   Vigg, Avanti, MD  levothyroxine (SYNTHROID) 25 MCG tablet Take 1 tablet (25 mcg total) by mouth every morning. 10/16/21   Johnson, Megan P, DO  lisinopril-hydrochlorothiazide (ZESTORETIC) 20-12.5 MG tablet Take 1 tablet by mouth daily. 10/21/21   Vigg, Avanti, MD  Magnesium Oxide -Mg Supplement 250 MG TABS Take 1 tablet by mouth daily. 01/28/21   [provider]  metFORMIN (GLUCOPHAGE) 1000 MG tablet Take 1 tablet  (1,000 mg total) by mouth 2 (two) times daily. 10/21/21   Vigg, Avanti, MD  nitroGLYCERIN (NITROSTAT) 0.4 MG SL tablet Place 0.4 mg under the tongue every 5 (five) minutes as needed for chest pain. Patient not taking: Reported on 10/28/2021    [provider]  rosuvastatin (CRESTOR) 10 MG tablet Take 10 mg by mouth daily. 10/08/19   [provider]  Semaglutide, 1 MG/DOSE, 4 MG/3ML SOPN Inject 1 mg as directed once a week. 07/21/21   McElwee, Jake Church, NP    Allergies as of 10/14/2021   (No Known Allergies)    Family History  Family history unknown: Yes    Social History   Socioeconomic History   Marital status: Married    Spouse name: Not on file   Number of children: Not on file   Years of education: Not on file   Highest education level: Not on file  Occupational History   Not on file  Tobacco Use   Smoking status: Never   Smokeless tobacco: Never  Vaping Use   Vaping Use: Never used  Substance and Sexual Activity   Alcohol use: Not Currently   Drug use: Not Currently   Sexual activity: Not Currently  Other Topics Concern   Not on file  Social History Narrative   ** Merged History Encounter **  Social Determinants of Health   Financial Resource Strain: Low Risk    Difficulty of Paying Living Expenses: Not hard at all  Food Insecurity: No Food Insecurity   Worried About Programme researcher, broadcasting/film/video in the Last Year: Never true   Ran Out of Food in the Last Year: Never true  Transportation Needs: No Transportation Needs   Lack of Transportation (Medical): No   Lack of Transportation (Non-Medical): No  Physical Activity: Inactive   Days of Exercise per Week: 0 days   Minutes of Exercise per Session: 0 min  Stress: No Stress Concern Present   Feeling of Stress : Not at all  Social Connections: Moderately Isolated   Frequency of Communication with Friends and Family: More than three times a week   Frequency of Social Gatherings with Friends and  Family: More than three times a week   Attends Religious Services: Never   Database administrator or Organizations: No   Attends Engineer, structural: Never   Marital Status: Married  Catering manager Violence: Not At Risk   Fear of Current or Ex-Partner: No   Emotionally Abused: No   Physically Abused: No   Sexually Abused: No    Review of Systems: See HPI, otherwise negative ROS  Physical Exam: BP 135/84    Pulse 62    Temp (!) 96.5 F (35.8 C) (Temporal)    Resp 20    Ht 5\' 4"  (1.626 m)    Wt 90.7 kg    SpO2 99%    BMI 34.33 kg/m  General:   Alert,  pleasant and cooperative in NAD Head:  Normocephalic and atraumatic. Neck:  Supple; no masses or thyromegaly. Lungs:  Clear throughout to auscultation.    Heart:  Regular rate and rhythm. Abdomen:  Soft, nontender and nondistended. Normal bowel sounds, without guarding, and without rebound.   Neurologic:  Alert and  oriented x4;  grossly normal neurologically.  Impression/Plan: is here for an colonoscopy to be performed for rectal bleeding  Risks, benefits, limitations, and alternatives regarding  colonoscopy have been reviewed with the patient.  Questions have been answered.  All parties agreeable.   Exie Parody, MD  10/30/2021, 8:52 AM

## 2021-10-30 NOTE — Anesthesia Postprocedure Evaluation (Signed)
Anesthesia Post Note  Patient: Shane Preston  Procedure(s) Performed: COLONOSCOPY WITH PROPOFOL  Patient location during evaluation: Endoscopy Anesthesia Type: General Level of consciousness: awake and alert Pain management: pain level controlled Vital Signs Assessment: post-procedure vital signs reviewed and stable Respiratory status: spontaneous breathing, nonlabored ventilation, respiratory function stable and patient connected to nasal cannula oxygen Cardiovascular status: blood pressure returned to baseline and stable Postop Assessment: no apparent nausea or vomiting Anesthetic complications: no   No notable events documented.   Last Vitals:  Vitals:   10/30/21 0949 10/30/21 0959  BP: 99/65 110/69  Pulse: (!) 57 (!) 59  Resp: 19 15  Temp:    SpO2: 99% 100%    Last Pain:  Vitals:   10/30/21 0959  TempSrc:   PainSc: 0-No pain                 Cleda Mccreedy Gianmarco Roye

## 2021-10-30 NOTE — Anesthesia Preprocedure Evaluation (Signed)
Anesthesia Evaluation  Patient identified by MRN, date of birth, ID band Patient awake    Reviewed: Allergy & Precautions, NPO status , Patient's Chart, lab work & pertinent test results  History of Anesthesia Complications Negative for: history of anesthetic complications  Airway Mallampati: III  TM Distance: >3 FB Neck ROM: full    Dental  (+) Chipped, Poor Dentition, Missing   Pulmonary neg pulmonary ROS, neg shortness of breath,    Pulmonary exam normal        Cardiovascular Exercise Tolerance: Good hypertension, (-) angina+ CAD and + Peripheral Vascular Disease  Normal cardiovascular exam     Neuro/Psych TIA Neuromuscular disease negative psych ROS   GI/Hepatic negative GI ROS, Neg liver ROS, neg GERD  ,  Endo/Other  diabetes, Type 2Hypothyroidism   Renal/GU negative Renal ROS  negative genitourinary   Musculoskeletal   Abdominal   Peds  Hematology negative hematology ROS (+)   Anesthesia Other Findings Past Medical History: No date: Anemia No date: Coronary artery disease No date: Diabetes mellitus without complication (HCC) No date: Hemorrhoids No date: Hyperlipidemia No date: Hypertension No date: Lumbar stenosis No date: Neuropathy No date: Peripheral vascular disease (HCC) No date: TIA (transient ischemic attack) No date: Tinea pedis  Past Surgical History: No date: CARDIAC CATHETERIZATION     Comment:  [facility]  No date: EYE SURGERY 10/11/2005: HERNIA REPAIR 10/11/2008: lumbar decompression surgery     Reproductive/Obstetrics negative OB ROS                             Anesthesia Physical Anesthesia Plan  ASA: 3  Anesthesia Plan: General   Post-op Pain Management:    Induction: Intravenous  PONV Risk Score and Plan: Propofol infusion and TIVA  Airway Management Planned: Natural Airway and Nasal Cannula  Additional Equipment:   Intra-op Plan:    Post-operative Plan:   Informed Consent: I have reviewed the patients History and Physical, chart, labs and discussed the procedure including the risks, benefits and alternatives for the proposed anesthesia with the patient or authorized representative who has indicated his/her understanding and acceptance.     Dental Advisory Given  Plan Discussed with: Anesthesiologist, CRNA and Surgeon  Anesthesia Plan Comments: (Patient consented for risks of anesthesia including but not limited to:  - adverse reactions to medications - risk of airway placement if required - damage to eyes, teeth, lips or other oral mucosa - nerve damage due to positioning  - sore throat or hoarseness - Damage to heart, brain, nerves, lungs, other parts of body or loss of life  Patient voiced understanding.)        Anesthesia Quick Evaluation

## 2021-10-30 NOTE — OR Nursing (Signed)
Per Herb Grays, Burns.

## 2021-11-02 ENCOUNTER — Other Ambulatory Visit: Payer: Self-pay

## 2021-11-02 ENCOUNTER — Encounter: Payer: Self-pay | Admitting: Gastroenterology

## 2021-11-02 ENCOUNTER — Inpatient Hospital Stay: Payer: Medicare HMO

## 2021-11-02 VITALS — BP 100/63 | HR 62 | Temp 96.9°F | Resp 19

## 2021-11-02 DIAGNOSIS — K625 Hemorrhage of anus and rectum: Secondary | ICD-10-CM | POA: Diagnosis not present

## 2021-11-02 DIAGNOSIS — D500 Iron deficiency anemia secondary to blood loss (chronic): Secondary | ICD-10-CM

## 2021-11-02 MED ORDER — SODIUM CHLORIDE 0.9 % IV SOLN
Freq: Once | INTRAVENOUS | Status: AC
  Filled 2021-11-02: qty 250

## 2021-11-02 MED ORDER — SODIUM CHLORIDE 0.9 % IV SOLN: 200.0000 mg | Freq: Once | INTRAVENOUS | Status: DC

## 2021-11-02 MED ORDER — IRON SUCROSE 20 MG/ML IV SOLN
200.0000 mg | Freq: Once | INTRAVENOUS | Status: AC
  Administered 2021-11-02: 200 mg via INTRAVENOUS
  Filled 2021-11-02: qty 10

## 2021-11-02 NOTE — Patient Instructions (Signed)

## 2021-11-09 ENCOUNTER — Inpatient Hospital Stay: Payer: Medicare HMO

## 2021-11-09 ENCOUNTER — Other Ambulatory Visit: Payer: Self-pay

## 2021-11-09 VITALS — BP 102/54 | HR 66 | Temp 97.3°F | Resp 18

## 2021-11-09 DIAGNOSIS — K625 Hemorrhage of anus and rectum: Secondary | ICD-10-CM | POA: Diagnosis not present

## 2021-11-09 DIAGNOSIS — D500 Iron deficiency anemia secondary to blood loss (chronic): Secondary | ICD-10-CM

## 2021-11-09 MED ORDER — SODIUM CHLORIDE 0.9 % IV SOLN
Freq: Once | INTRAVENOUS | Status: AC
  Filled 2021-11-09: qty 250

## 2021-11-09 MED ORDER — SODIUM CHLORIDE 0.9 % IV SOLN: 200.0000 mg | Freq: Once | INTRAVENOUS | Status: DC

## 2021-11-09 MED ORDER — IRON SUCROSE 20 MG/ML IV SOLN
200.0000 mg | Freq: Once | INTRAVENOUS | Status: AC
  Administered 2021-11-09: 200 mg via INTRAVENOUS
  Filled 2021-11-09: qty 10

## 2021-11-09 NOTE — Progress Notes (Signed)
Pt tolerated all infusions well and left the infusion suite stable and ambulatory.

## 2021-11-09 NOTE — Patient Instructions (Signed)

## 2021-11-16 ENCOUNTER — Other Ambulatory Visit: Payer: Self-pay

## 2021-11-16 ENCOUNTER — Inpatient Hospital Stay: Payer: Medicare HMO | Attending: Oncology

## 2021-11-16 VITALS — BP 97/63 | HR 63 | Temp 96.2°F | Resp 18

## 2021-11-16 DIAGNOSIS — K625 Hemorrhage of anus and rectum: Secondary | ICD-10-CM | POA: Insufficient documentation

## 2021-11-16 DIAGNOSIS — D500 Iron deficiency anemia secondary to blood loss (chronic): Secondary | ICD-10-CM | POA: Insufficient documentation

## 2021-11-16 MED ORDER — SODIUM CHLORIDE 0.9 % IV SOLN: 200.0000 mg | Freq: Once | INTRAVENOUS | Status: DC

## 2021-11-16 MED ORDER — SODIUM CHLORIDE 0.9 % IV SOLN
Freq: Once | INTRAVENOUS | Status: AC
  Filled 2021-11-16: qty 250

## 2021-11-16 MED ORDER — IRON SUCROSE 20 MG/ML IV SOLN
200.0000 mg | Freq: Once | INTRAVENOUS | Status: AC
  Administered 2021-11-16: 200 mg via INTRAVENOUS
  Filled 2021-11-16: qty 10

## 2021-11-16 NOTE — Patient Instructions (Addendum)
Iron Sucrose Injection Qu es este medicamento? El HIERRO SACAROSA trata los niveles bajos de hierro (anemia por deficiencia de hierro) en personas con enfermedad renal. El hierro es un mineral que cumple una funcin importante en la produccin de glbulos rojos, que llevan el oxgeno de los pulmones al resto del cuerpo. Este medicamento puede ser utilizado para otros usos; si tiene alguna pregunta consulte con su proveedor de atencin mdica o con su farmacutico. MARCAS COMUNES: Venofer Qu le debo informar a mi profesional de la salud antes de tomar este medicamento? Necesitan saber si usted presenta alguno de los siguientes problemas o situaciones: Anemia no causada por niveles bajos de hierro Enfermedad cardiaca Niveles altos de hierro en la sangre Enfermedad renal Enfermedad heptica Una reaccin alrgica o inusual al hierro, a otros medicamentos, alimentos, colorantes o conservantes Si est embarazada o buscando quedar embarazada Si est amamantando a un beb Cmo debo utilizar este medicamento? Este medicamento se administra mediante infusin en una vena. Se administra en un hospital o en un entorno clnico. Hable con su equipo de atencin sobre el uso de este medicamento en nios. Aunque este medicamento se puede recetar a nios tan pequeos como de 2 aos de edad con ciertas afecciones, existen precauciones que deben tomarse. Sobredosis: Pngase en contacto inmediatamente con un centro toxicolgico o una sala de urgencia si usted cree que haya tomado demasiado medicamento. ATENCIN: Este medicamento es solo para usted. No comparta este medicamento con nadie. Qu sucede si me olvido de una dosis? Es importante no olvidar ninguna dosis. Llame a su equipo de atencin si no puede asistir a una cita. Qu puede interactuar con este medicamento? No use este medicamento con ninguno de los siguientes productos: Deferoxamina Dimercaprol Otros productos con hierro Este medicamento  tambin podra interactuar con los siguientes productos: Cloranfenicol Deferasirox Puede ser que esta lista no menciona todas las posibles interacciones. Informe a su profesional de la salud de todos los productos a base de hierbas, medicamentos de venta libre o suplementos nutritivos que est tomando. Si usted fuma, consume bebidas alcohlicas o si utiliza drogas ilegales, indqueselo tambin a su profesional de la salud. Algunas sustancias pueden interactuar con su medicamento. A qu debo estar atento al usar este medicamento? Visite peridicamente a su equipo de atencin. Si los sntomas no comienzan a mejorar o si empeoran, consulte con su equipo de atencin. Usted podra necesitar realizarse anlisis de sangre mientras est usando este medicamento. Es posible que deba seguir una dieta especial. Hable con su equipo de atencin. Los alimentos que contienen hierro incluyen: granos enteros/cereales, frutas secas, legumbres, o guisantes, verduras de hojas verdes y asaduras (hgado, rin). Qu efectos secundarios puedo tener al utilizar este medicamento? Efectos secundarios que debe informar a su equipo de atencin tan pronto como sea posible: Reacciones alrgicas: erupcin cutnea, comezn/picazn, urticaria, hinchazn de la cara, los labios, la lengua o la garganta Presin arterial baja: mareo, sensacin de desmayo o aturdimiento, visin borrosa Falta de aliento Efectos secundarios que generalmente no requieren atencin mdica (debe informarlos a su equipo de atencin si persisten o si son molestos): Enrojecimiento Dolor de cabeza Dolor en las articulaciones Dolor muscular Nuseas Dolor, enrojecimiento o irritacin en el lugar de la inyeccin Puede ser que esta lista no menciona todos los posibles efectos secundarios. Comunquese a su mdico por asesoramiento mdico sobre los efectos secundarios. Usted puede informar los efectos secundarios a la FDA por telfono al 1-800-FDA-1088. Dnde debo  guardar mi medicina? Este medicamento se administra en hospitales o clnicas   y no necesitar guardarlo en su domicilio. ATENCIN: Este folleto es un resumen. Puede ser que no cubra toda la posible informacin. Si usted tiene preguntas acerca de esta medicina, consulte con su mdico, su farmacutico o su profesional de la salud.  2022 Elsevier/Gold Standard (2021-05-01 00:00:00)  

## 2021-11-18 ENCOUNTER — Other Ambulatory Visit: Payer: Self-pay

## 2021-11-18 ENCOUNTER — Ambulatory Visit (INDEPENDENT_AMBULATORY_CARE_PROVIDER_SITE_OTHER): Payer: Medicare HMO | Admitting: Internal Medicine

## 2021-11-18 ENCOUNTER — Encounter: Payer: Self-pay | Admitting: Internal Medicine

## 2021-11-18 ENCOUNTER — Other Ambulatory Visit: Payer: Self-pay | Admitting: Nurse Practitioner

## 2021-11-18 VITALS — BP 105/69 | HR 62 | Temp 98.2°F | Ht 61.02 in | Wt 200.0 lb

## 2021-11-18 DIAGNOSIS — D649 Anemia, unspecified: Secondary | ICD-10-CM

## 2021-11-18 DIAGNOSIS — E785 Hyperlipidemia, unspecified: Secondary | ICD-10-CM

## 2021-11-18 LAB — CBC WITH DIFFERENTIAL/PLATELET
Hematocrit: 37.5 % (ref 37.5–51.0)
Hemoglobin: 12.2 g/dL — ABNORMAL LOW (ref 13.0–17.7)
Lymphocytes Absolute: 1.9 10*3/uL (ref 0.7–3.1)
Lymphs: 30 %
MCH: 26.9 pg (ref 26.6–33.0)
MCHC: 32.5 g/dL (ref 31.5–35.7)
MCV: 83 fL (ref 79–97)
MID (Absolute): 0.5 10*3/uL (ref 0.1–1.6)
MID: 8 %
Neutrophils Absolute: 4 10*3/uL (ref 1.4–7.0)
Neutrophils: 62 %
Platelets: 401 10*3/uL (ref 150–450)
RBC: 4.54 x10E6/uL (ref 4.14–5.80)
RDW: 18.6 % — ABNORMAL HIGH (ref 11.6–15.4)
WBC: 6.4 10*3/uL (ref 3.4–10.8)

## 2021-11-18 NOTE — Progress Notes (Signed)
BP 105/69    Pulse 62    Temp 98.2 F (36.8 C) (Oral)    Ht 5' 1.02" (1.55 m)    Wt 200 lb (90.7 kg)    SpO2 98%    BMI 37.76 kg/m    Subjective:    Patient ID: Shane Preston, male    DOB: April 19, 1961, 61 y.o.   MRN: [id number]  Chief Complaint  Patient presents with   anemia    HPI: Shane Preston is a 61 y.o. male  Rectal bleeding still continues - has had 2 iv iron infusions, to have 2 more sees Dr. Tasia Catchings  Hearing still poor, to see ENT this Friday   Anemia Presents for follow-up visit. There has been no abdominal pain, bruising/bleeding easily, confusion, fever, light-headedness or palpitations.  Diabetes He presents for his follow-up diabetic visit. He has type 2 diabetes mellitus. Pertinent negatives for hypoglycemia include no confusion, dizziness, headaches, speech difficulty or tremors. Pertinent negatives for diabetes include no chest pain, no fatigue, no polydipsia, no polyphagia, no polyuria and no weakness.   Chief Complaint  Patient presents with   anemia    Relevant past medical, surgical, family and social history reviewed and updated as indicated. Interim medical history since our last visit reviewed. Allergies and medications reviewed and updated.  Review of Systems  Constitutional:  Negative for activity change, appetite change, chills, fatigue and fever.  HENT:  Negative for congestion, ear discharge, ear pain and facial swelling.   Eyes:  Negative for pain, discharge and itching.  Respiratory:  Negative for cough, chest tightness, shortness of breath and wheezing.   Cardiovascular:  Negative for chest pain, palpitations and leg swelling.  Gastrointestinal:  Negative for abdominal distention, abdominal pain, blood in stool, constipation, diarrhea, nausea and vomiting.  Endocrine: Negative for cold intolerance, heat intolerance, polydipsia, polyphagia and polyuria.  Genitourinary:  Negative for difficulty urinating, dysuria, flank pain,  frequency, hematuria and urgency.  Musculoskeletal:  Negative for arthralgias, gait problem, joint swelling and myalgias.  Skin:  Negative for color change, rash and wound.  Neurological:  Negative for dizziness, tremors, speech difficulty, weakness, light-headedness, numbness and headaches.  Hematological:  Does not bruise/bleed easily.  Psychiatric/Behavioral:  Negative for agitation, confusion, decreased concentration, sleep disturbance and suicidal ideas.    Per HPI unless specifically indicated above     Objective:    BP 105/69    Pulse 62    Temp 98.2 F (36.8 C) (Oral)    Ht 5' 1.02" (1.55 m)    Wt 200 lb (90.7 kg)    SpO2 98%    BMI 37.76 kg/m   Wt Readings from Last 3 Encounters:  11/18/21 200 lb (90.7 kg)  10/30/21 200 lb (90.7 kg)  10/28/21 200 lb 1.6 oz (90.8 kg)    Physical Exam Vitals and nursing note reviewed.  Constitutional:      General: He is not in acute distress.    Appearance: Normal appearance. He is not ill-appearing or diaphoretic.  HENT:     Head: Atraumatic.  Eyes:     Conjunctiva/sclera: Conjunctivae normal.  Cardiovascular:     Rate and Rhythm: Normal rate and regular rhythm.  Pulmonary:     Effort: No respiratory distress.     Breath sounds: No stridor. No wheezing or rhonchi.  Chest:     Chest wall: No tenderness.  Abdominal:     General: Abdomen is flat. Bowel sounds are normal.     Palpations: Abdomen is  soft. There is no mass.     Tenderness: There is no abdominal tenderness.  Musculoskeletal:     Cervical back: Normal range of motion and neck supple. No rigidity or tenderness.     Left lower leg: No edema.  Skin:    General: Skin is warm and dry.  Neurological:     Mental Status: He is alert.  Psychiatric:        Mood and Affect: Mood normal.        Behavior: Behavior normal.        Thought Content: Thought content normal.        Judgment: Judgment normal.    Results for orders placed or performed during the hospital encounter  of 10/30/21  Glucose, capillary  Result Value Ref Range   Glucose-Capillary 111 (H) 70 - 99 mg/dL   Comment 1 IN EPIC         Current Outpatient Medications:    carvedilol (COREG) 25 MG tablet, Take 1 tablet (25 mg total) by mouth 2 (two) times daily., Disp: 180 tablet, Rfl: 0   gabapentin (NEURONTIN) 300 MG capsule, Take 1 capsule (300 mg total) by mouth 3 (three) times daily., Disp: 90 capsule, Rfl: 1   glucose blood test strip, 2 each by Other route daily. Use as instructed, Disp: , Rfl:    Iron, Ferrous Sulfate, 325 (65 Fe) MG TABS, Take 325 mg by mouth in the morning and at bedtime., Disp: 60 tablet, Rfl: 2   JARDIANCE 25 MG TABS tablet, Take 1 tablet (25 mg total) by mouth daily., Disp: 90 tablet, Rfl: 0   levothyroxine (SYNTHROID) 25 MCG tablet, Take 1 tablet (25 mcg total) by mouth every morning., Disp: 90 tablet, Rfl: 3   lisinopril-hydrochlorothiazide (ZESTORETIC) 20-12.5 MG tablet, Take 1 tablet by mouth daily., Disp: 90 tablet, Rfl: 0   Magnesium Oxide -Mg Supplement 250 MG TABS, Take 1 tablet by mouth daily., Disp: , Rfl:    metFORMIN (GLUCOPHAGE) 1000 MG tablet, Take 1 tablet (1,000 mg total) by mouth 2 (two) times daily., Disp: 180 tablet, Rfl: 0   nitroGLYCERIN (NITROSTAT) 0.4 MG SL tablet, Place 0.4 mg under the tongue every 5 (five) minutes as needed for chest pain. (Patient not taking: Reported on 10/28/2021), Disp: , Rfl:    rosuvastatin (CRESTOR) 10 MG tablet, Take 10 mg by mouth daily., Disp: , Rfl:    Semaglutide, 1 MG/DOSE, 4 MG/3ML SOPN, Inject 1 mg as directed once a week., Disp: 3 mL, Rfl: 3    Assessment & Plan:  Rectal bleeding : seen Dr. Marius Ditch  Pt was bleeding secondary to internal hemorrhoids based on endoscopy today. S/p hemorrhoid ligation on 1/4 / 23 Fu with Gi for such ? Needs repeat cscope per pts verbal record. To fu with GI as scheudled. If has recurrent bleeds need to be seen asap.   Anemia to continue iv venofer sec to rectal bleeding.  Recheck  labs.  Fu with dr. Tasia Catchings for iv infusions  Is on iron so4 orally as well.   Dm stable a1c better at 7.1  is on metfomrin  jardiance 25 mg daily for such continue  check HbA1c,  urine  microalbumin  diabetic diet plan given to pt  adviced regarding hypoglycemia and instructions given to pt today on how to prevent and treat the same if it were to occur. pt acknowledges the plan and voices understanding of the same.  exercise plan given and encouraged.   advice diabetic yearly podiatry,  ophthalmology , nutritionist , dental check q 6 months,   HLD : is on crestor.  recheck FLP, check LFT's work on diet, SE of meds explained to pt. low fat and high fiber diet explained to pt.   Problem List Items Addressed This Visit       Other   Anemia - Primary   Relevant Orders   CBC with Differential/Platelet   CBC With Differential/Platelet   Lipid panel   Comprehensive metabolic panel   Other Visit Diagnoses     Hyperlipidemia, unspecified hyperlipidemia type       Relevant Orders   Lipid panel   Comprehensive metabolic panel        Orders Placed This Encounter  Procedures   CBC with Differential/Platelet   CBC With Differential/Platelet   Lipid panel   Comprehensive metabolic panel     No orders of the defined types were placed in this encounter.    Follow up plan: No follow-ups on file.

## 2021-11-19 LAB — CBC WITH DIFFERENTIAL/PLATELET
Basophils Absolute: 0 10*3/uL (ref 0.0–0.2)
Basos: 1 %
EOS (ABSOLUTE): 0.1 10*3/uL (ref 0.0–0.4)
Eos: 2 %
Hematocrit: 38.3 % (ref 37.5–51.0)
Hemoglobin: 11.9 g/dL — ABNORMAL LOW (ref 13.0–17.7)
Immature Grans (Abs): 0 10*3/uL (ref 0.0–0.1)
Immature Granulocytes: 0 %
Lymphocytes Absolute: 1.7 10*3/uL (ref 0.7–3.1)
Lymphs: 28 %
MCH: 26.5 pg — ABNORMAL LOW (ref 26.6–33.0)
MCHC: 31.1 g/dL — ABNORMAL LOW (ref 31.5–35.7)
MCV: 85 fL (ref 79–97)
Monocytes Absolute: 0.4 10*3/uL (ref 0.1–0.9)
Monocytes: 7 %
Neutrophils Absolute: 3.6 10*3/uL (ref 1.4–7.0)
Neutrophils: 62 %
Platelets: 374 10*3/uL (ref 150–450)
RBC: 4.49 x10E6/uL (ref 4.14–5.80)
RDW: 17.9 % — ABNORMAL HIGH (ref 11.6–15.4)
WBC: 5.9 10*3/uL (ref 3.4–10.8)

## 2021-11-19 LAB — COMPREHENSIVE METABOLIC PANEL
ALT: 16 IU/L (ref 0–44)
AST: 14 IU/L (ref 0–40)
Albumin/Globulin Ratio: 1.6 (ref 1.2–2.2)
Albumin: 4.6 g/dL (ref 3.8–4.8)
Alkaline Phosphatase: 75 IU/L (ref 44–121)
BUN/Creatinine Ratio: 12 (ref 10–24)
BUN: 12 mg/dL (ref 8–27)
Bilirubin Total: 0.2 mg/dL (ref 0.0–1.2)
CO2: 22 mmol/L (ref 20–29)
Calcium: 9.5 mg/dL (ref 8.6–10.2)
Chloride: 100 mmol/L (ref 96–106)
Creatinine, Ser: 0.98 mg/dL (ref 0.76–1.27)
Globulin, Total: 2.8 g/dL (ref 1.5–4.5)
Glucose: 93 mg/dL (ref 70–99)
Potassium: 4.6 mmol/L (ref 3.5–5.2)
Sodium: 137 mmol/L (ref 134–144)
Total Protein: 7.4 g/dL (ref 6.0–8.5)
eGFR: 88 mL/min/{1.73_m2} (ref >59)

## 2021-11-19 LAB — LIPID PANEL
Chol/HDL Ratio: 3.2 ratio (ref 0.0–5.0)
Cholesterol, Total: 122 mg/dL (ref 100–199)
HDL: 38 mg/dL — ABNORMAL LOW (ref >39)
LDL Chol Calc (NIH): 52 mg/dL (ref 0–99)
Triglycerides: 198 mg/dL — ABNORMAL HIGH (ref 0–149)
VLDL Cholesterol Cal: 32 mg/dL (ref 5–40)

## 2021-11-23 ENCOUNTER — Inpatient Hospital Stay: Payer: Medicare HMO

## 2021-11-23 ENCOUNTER — Other Ambulatory Visit: Payer: Self-pay

## 2021-11-23 VITALS — BP 100/65 | HR 60 | Temp 96.1°F | Resp 18

## 2021-11-23 DIAGNOSIS — D500 Iron deficiency anemia secondary to blood loss (chronic): Secondary | ICD-10-CM

## 2021-11-23 DIAGNOSIS — K625 Hemorrhage of anus and rectum: Secondary | ICD-10-CM | POA: Diagnosis not present

## 2021-11-23 MED ORDER — SODIUM CHLORIDE 0.9 % IV SOLN
Freq: Once | INTRAVENOUS | Status: AC
  Filled 2021-11-23: qty 250

## 2021-11-23 MED ORDER — IRON SUCROSE 20 MG/ML IV SOLN
200.0000 mg | Freq: Once | INTRAVENOUS | Status: AC
  Administered 2021-11-23: 200 mg via INTRAVENOUS
  Filled 2021-11-23: qty 10

## 2021-11-23 MED ORDER — SODIUM CHLORIDE 0.9 % IV SOLN: 200.0000 mg | Freq: Once | INTRAVENOUS | Status: DC

## 2021-11-23 NOTE — Patient Instructions (Signed)

## 2021-12-14 ENCOUNTER — Other Ambulatory Visit: Payer: Self-pay | Admitting: Internal Medicine

## 2021-12-15 NOTE — Telephone Encounter (Signed)
Requested Prescriptions  ?Pending Prescriptions Disp Refills  ?? OZEMPIC, 1 MG/DOSE, 4 MG/3ML SOPN [Pharmacy Med Name: OZEMPIC 1 MG/DOSE (4 MG/3 ML)] 3 mL 0  ?  Sig: Inject 1 mg as directed once a week.  ?  ? Endocrinology:  Diabetes - GLP-1 Receptor Agonists - semaglutide Failed - 12/14/2021  8:55 AM  ?  ?  Failed - HBA1C in normal range and within 180 days  ?  HB A1C (BAYER DCA - WAIVED)  ?Date Value Ref Range Status  ?10/21/2021 7.1 (H) 4.8 - 5.6 % Final  ?  Comment:  ?           Prediabetes: 5.7 - 6.4 ?         Diabetes: >6.4 ?         Glycemic control for adults with diabetes: <7.0 ?  ?   ?  ?  Passed - Cr in normal range and within 360 days  ?  Creatinine  ?Date Value Ref Range Status  ?01/10/2014 0.82 0.60 - 1.30 mg/dL Final  ? ?Creatinine, Ser  ?Date Value Ref Range Status  ?11/18/2021 0.98 0.76 - 1.27 mg/dL Final  ?   ?  ?  Passed - Valid encounter within last 6 months  ?  Recent Outpatient Visits   ?      ? 3 weeks ago Anemia, unspecified type  ? [facility] Vigg, Avanti, MD  ? 1 month ago Anemia, unspecified type  ? [facility] Vigg, Avanti, MD  ? 2 months ago BRBPR (bright red blood per rectum)  ? [facility] Vigg, Avanti, MD  ? 4 months ago Diabetes mellitus without complication (HCC)  ? [name] McElwee, Lauren A, NP  ? 7 months ago Back pain, unspecified back location, unspecified back pain laterality, unspecified chronicity  ? [name] Vigg, Avanti, MD  ?  ?  ?Future Appointments   ?        ? In 2 weeks Vigg, Avanti, MD [facility]  ?  ? ?  ?  ?  ? ? ?

## 2021-12-28 ENCOUNTER — Other Ambulatory Visit: Payer: Self-pay

## 2021-12-28 MED ORDER — GLUCOSE BLOOD VI STRP: ORAL_STRIP | 12 refills | Status: DC

## 2021-12-30 ENCOUNTER — Other Ambulatory Visit: Payer: Self-pay

## 2021-12-30 ENCOUNTER — Ambulatory Visit (INDEPENDENT_AMBULATORY_CARE_PROVIDER_SITE_OTHER): Payer: Medicare HMO | Admitting: Internal Medicine

## 2021-12-30 ENCOUNTER — Encounter: Payer: Self-pay | Admitting: Internal Medicine

## 2021-12-30 VITALS — BP 104/69 | HR 57 | Temp 98.7°F | Ht 61.02 in | Wt 200.0 lb

## 2021-12-30 DIAGNOSIS — I10 Essential (primary) hypertension: Secondary | ICD-10-CM

## 2021-12-30 DIAGNOSIS — E782 Mixed hyperlipidemia: Secondary | ICD-10-CM

## 2021-12-30 DIAGNOSIS — D649 Anemia, unspecified: Secondary | ICD-10-CM | POA: Diagnosis not present

## 2021-12-30 DIAGNOSIS — E119 Type 2 diabetes mellitus without complications: Secondary | ICD-10-CM

## 2021-12-30 LAB — CBC WITH DIFFERENTIAL/PLATELET
Hematocrit: 43.3 % (ref 37.5–51.0)
Hemoglobin: 14.5 g/dL (ref 13.0–17.7)
Lymphocytes Absolute: 1.9 10*3/uL (ref 0.7–3.1)
Lymphs: 29 %
MCH: 27.9 pg (ref 26.6–33.0)
MCHC: 33.5 g/dL (ref 31.5–35.7)
MCV: 83 fL (ref 79–97)
MID (Absolute): 0.5 10*3/uL (ref 0.1–1.6)
MID: 8 %
Neutrophils Absolute: 4.2 10*3/uL (ref 1.4–7.0)
Neutrophils: 63 %
Platelets: 337 10*3/uL (ref 150–450)
RBC: 5.2 x10E6/uL (ref 4.14–5.80)
RDW: 16.6 % — ABNORMAL HIGH (ref 11.6–15.4)
WBC: 6.6 10*3/uL (ref 3.4–10.8)

## 2021-12-30 LAB — BAYER DCA HB A1C WAIVED: HB A1C (BAYER DCA - WAIVED): 6.2 % — ABNORMAL HIGH (ref 4.8–5.6)

## 2021-12-30 MED ORDER — JARDIANCE 25 MG PO TABS: 25.0000 mg | ORAL_TABLET | Freq: Every day | ORAL | 0 refills | Status: DC

## 2021-12-30 MED ORDER — GABAPENTIN 300 MG PO CAPS: 300.0000 mg | ORAL_CAPSULE | Freq: Three times a day (TID) | ORAL | 1 refills | Status: DC

## 2021-12-30 MED ORDER — LISINOPRIL-HYDROCHLOROTHIAZIDE 20-12.5 MG PO TABS: 1.0000 | ORAL_TABLET | Freq: Every day | ORAL | 0 refills | Status: DC

## 2021-12-30 MED ORDER — METFORMIN HCL 1000 MG PO TABS: 1000.0000 mg | ORAL_TABLET | Freq: Two times a day (BID) | ORAL | 0 refills | Status: DC

## 2021-12-30 MED ORDER — ROSUVASTATIN CALCIUM 20 MG PO TABS: 20.0000 mg | ORAL_TABLET | Freq: Every day | ORAL | 6 refills | Status: DC

## 2021-12-30 MED ORDER — OZEMPIC (1 MG/DOSE) 4 MG/3ML ~~LOC~~ SOPN: PEN_INJECTOR | SUBCUTANEOUS | 0 refills | Status: DC

## 2021-12-30 NOTE — Progress Notes (Signed)
? ?BP 104/69   Pulse (!) 57   Temp 98.7 ?F (37.1 ?C) (Oral)   Ht 5' 1.02" (1.55 m)   Wt 200 lb (90.7 kg)   SpO2 98%   BMI 37.76 kg/m?   ? ?Subjective:  ? ? Patient ID: Shane Preston, male    DOB: 02-14-1961, 61 y.o.   MRN: [id number] ? ?Chief Complaint  ?Patient presents with  ? Diabetes  ? ? ?HPI: ?Shane Preston is a 61 y.o. male ? ?Diabetes ?He presents for his follow-up diabetic visit. He has type 2 diabetes mellitus. Pertinent negatives for hypoglycemia include no confusion or pallor. Pertinent negatives for diabetes include no blurred vision, no chest pain, no fatigue, no foot paresthesias, no foot ulcerations, no polydipsia, no polyphagia and no polyuria. Symptoms are improving.  ?Anemia ?Presents for follow-up visit. There has been no abdominal pain, anorexia, bruising/bleeding easily, confusion, fever, malaise/fatigue, pallor, palpitations, paresthesias or pica.  ? ?Chief Complaint  ?Patient presents with  ? Diabetes  ? ? ?Relevant past medical, surgical, family and social history reviewed and updated as indicated. Interim medical history since our last visit reviewed. ?Allergies and medications reviewed and updated. ? ?Review of Systems  ?Constitutional:  Negative for fatigue, fever and malaise/fatigue.  ?Eyes:  Negative for blurred vision.  ?Cardiovascular:  Negative for chest pain and palpitations.  ?Gastrointestinal:  Negative for abdominal pain and anorexia.  ?Endocrine: Negative for polydipsia, polyphagia and polyuria.  ?Skin:  Negative for pallor.  ?Neurological:  Negative for paresthesias.  ?Hematological:  Does not bruise/bleed easily.  ?Psychiatric/Behavioral:  Negative for confusion.   ? ?Per HPI unless specifically indicated above ? ?   ?Objective:  ?  ?BP 104/69   Pulse (!) 57   Temp 98.7 ?F (37.1 ?C) (Oral)   Ht 5' 1.02" (1.55 m)   Wt 200 lb (90.7 kg)   SpO2 98%   BMI 37.76 kg/m?   ?Wt Readings from Last 3 Encounters:  ?12/30/21 200 lb (90.7 kg)  ?11/18/21 200 lb  (90.7 kg)  ?10/30/21 200 lb (90.7 kg)  ?  ?Physical Exam ?Vitals and nursing note reviewed.  ?Constitutional:   ?   General: He is not in acute distress. ?   Appearance: Normal appearance. He is not ill-appearing or diaphoretic.  ?HENT:  ?   Head: Normocephalic and atraumatic.  ?   Right Ear: Tympanic membrane and external ear normal. There is no impacted cerumen.  ?   Left Ear: External ear normal.  ?   Nose: No congestion or rhinorrhea.  ?   Mouth/Throat:  ?   Pharynx: No oropharyngeal exudate or posterior oropharyngeal erythema.  ?Eyes:  ?   Conjunctiva/sclera: Conjunctivae normal.  ?   Pupils: Pupils are equal, round, and reactive to light.  ?Cardiovascular:  ?   Rate and Rhythm: Normal rate and regular rhythm.  ?   Heart sounds: No murmur heard. ?  No friction rub. No gallop.  ?Pulmonary:  ?   Effort: No respiratory distress.  ?   Breath sounds: No stridor. No wheezing or rhonchi.  ?Chest:  ?   Chest wall: No tenderness.  ?Abdominal:  ?   Tenderness: There is no abdominal tenderness.  ?Musculoskeletal:  ?   Cervical back: Normal range of motion and neck supple. No rigidity or tenderness.  ?   Left lower leg: No edema.  ?Skin: ?   General: Skin is warm and dry.  ?Neurological:  ?   Mental Status: He is alert.  ?  Deep Tendon Reflexes: Reflexes normal.  ?Psychiatric:     ?   Mood and Affect: Mood normal.     ?   Thought Content: Thought content normal.  ? ? ?Results for orders placed or performed in visit on 11/18/21  ?CBC with Differential/Platelet  ?Result Value Ref Range  ? WBC 5.9 3.4 - 10.8 x10E3/uL  ? RBC 4.49 4.14 - 5.80 x10E6/uL  ? Hemoglobin 11.9 (L) 13.0 - 17.7 g/dL  ? Hematocrit 38.3 37.5 - 51.0 %  ? MCV 85 79 - 97 fL  ? MCH 26.5 (L) 26.6 - 33.0 pg  ? MCHC 31.1 (L) 31.5 - 35.7 g/dL  ? RDW 17.9 (H) 11.6 - 15.4 %  ? Platelets 374 150 - 450 x10E3/uL  ? Neutrophils 62 Not Estab. %  ? Lymphs 28 Not Estab. %  ? Monocytes 7 Not Estab. %  ? Eos 2 Not Estab. %  ? Basos 1 Not Estab. %  ? Neutrophils Absolute  3.6 1.4 - 7.0 x10E3/uL  ? Lymphocytes Absolute 1.7 0.7 - 3.1 x10E3/uL  ? Monocytes Absolute 0.4 0.1 - 0.9 x10E3/uL  ? EOS (ABSOLUTE) 0.1 0.0 - 0.4 x10E3/uL  ? Basophils Absolute 0.0 0.0 - 0.2 x10E3/uL  ? Immature Granulocytes 0 Not Estab. %  ? Immature Grans (Abs) 0.0 0.0 - 0.1 x10E3/uL  ?CBC With Differential/Platelet  ?Result Value Ref Range  ? WBC 6.4 3.4 - 10.8 x10E3/uL  ? RBC 4.54 4.14 - 5.80 x10E6/uL  ? Hemoglobin 12.2 (L) 13.0 - 17.7 g/dL  ? Hematocrit 37.5 37.5 - 51.0 %  ? MCV 83 79 - 97 fL  ? MCH 26.9 26.6 - 33.0 pg  ? MCHC 32.5 31.5 - 35.7 g/dL  ? RDW 18.6 (H) 11.6 - 15.4 %  ? Platelets 401 150 - 450 x10E3/uL  ? Neutrophils 62 Not Estab. %  ? Lymphs 30 Not Estab. %  ? MID 8 Not Estab. %  ? Neutrophils Absolute 4.0 1.4 - 7.0 x10E3/uL  ? Lymphocytes Absolute 1.9 0.7 - 3.1 x10E3/uL  ? MID (Absolute) 0.5 0.1 - 1.6 X10E3/uL  ?Comprehensive metabolic panel  ?Result Value Ref Range  ? Glucose 93 70 - 99 mg/dL  ? BUN 12 8 - 27 mg/dL  ? Creatinine, Ser 0.98 0.76 - 1.27 mg/dL  ? eGFR 88 >59 mL/min/1.73  ? BUN/Creatinine Ratio 12 10 - 24  ? Sodium 137 134 - 144 mmol/L  ? Potassium 4.6 3.5 - 5.2 mmol/L  ? Chloride 100 96 - 106 mmol/L  ? CO2 22 20 - 29 mmol/L  ? Calcium 9.5 8.6 - 10.2 mg/dL  ? Total Protein 7.4 6.0 - 8.5 g/dL  ? Albumin 4.6 3.8 - 4.8 g/dL  ? Globulin, Total 2.8 1.5 - 4.5 g/dL  ? Albumin/Globulin Ratio 1.6 1.2 - 2.2  ? Bilirubin Total 0.2 0.0 - 1.2 mg/dL  ? Alkaline Phosphatase 75 44 - 121 IU/L  ? AST 14 0 - 40 IU/L  ? ALT 16 0 - 44 IU/L  ?Lipid panel  ?Result Value Ref Range  ? Cholesterol, Total 122 100 - 199 mg/dL  ? Triglycerides 198 (H) 0 - 149 mg/dL  ? HDL 38 (L) >39 mg/dL  ? VLDL Cholesterol Cal 32 5 - 40 mg/dL  ? LDL Chol Calc (NIH) 52 0 - 99 mg/dL  ? Chol/HDL Ratio 3.2 0.0 - 5.0 ratio  ? ?   ? ? ?Current Outpatient Medications:  ?  carvedilol (COREG) 25 MG tablet, Take 1 tablet (25  mg total) by mouth 2 (two) times daily., Disp: 180 tablet, Rfl: 0 ?  glucose blood test strip, Use as instructed,  Disp: 100 each, Rfl: 12 ?  Iron, Ferrous Sulfate, 325 (65 Fe) MG TABS, Take 325 mg by mouth in the morning and at bedtime., Disp: 60 tablet, Rfl: 2 ?  levothyroxine (SYNTHROID) 25 MCG tablet, Take 1 tablet (25 mcg total) by mouth every morning., Disp: 90 tablet, Rfl: 3 ?  Magnesium Oxide -Mg Supplement 250 MG TABS, Take 1 tablet by mouth daily., Disp: , Rfl:  ?  nitroGLYCERIN (NITROSTAT) 0.4 MG SL tablet, Place 0.4 mg under the tongue every 5 (five) minutes as needed for chest pain., Disp: , Rfl:  ?  gabapentin (NEURONTIN) 300 MG capsule, Take 1 capsule (300 mg total) by mouth 3 (three) times daily., Disp: 90 capsule, Rfl: 1 ?  JARDIANCE 25 MG TABS tablet, Take 1 tablet (25 mg total) by mouth daily., Disp: 90 tablet, Rfl: 0 ?  lisinopril-hydrochlorothiazide (ZESTORETIC) 20-12.5 MG tablet, Take 1 tablet by mouth daily., Disp: 90 tablet, Rfl: 0 ?  metFORMIN (GLUCOPHAGE) 1000 MG tablet, Take 1 tablet (1,000 mg total) by mouth 2 (two) times daily., Disp: 180 tablet, Rfl: 0 ?  rosuvastatin (CRESTOR) 20 MG tablet, Take 1 tablet (20 mg total) by mouth daily., Disp: 30 tablet, Rfl: 6 ?  Semaglutide, 1 MG/DOSE, (OZEMPIC, 1 MG/DOSE,) 4 MG/3ML SOPN, Inject 1 mg as directed once a week., Disp: 3 mL, Rfl: 0  ? ? ?Assessment & Plan:  ?Rectal bleeding : stable no more bleeds ?S/p hemorrhoid ligation on 1/4 / 23 To fu with GI as scheudled. If has recurrent bleeds need to be seen asap. ?  ?  ?Anemia to continue iv venofer sec to rectal bleeding.  ?Recheck labs.  ? ?  ?Dm stable a1c better at 7.1  is on metfomrin  jardiance 25 mg daily for such continue  ?check HbA1c,  urine  microalbumin  diabetic diet plan given to pt  adviced regarding hypoglycemia and instructions given to pt today on how to prevent and treat the same if it were to occur. pt acknowledges the plan and voices understanding of the same.  exercise plan given and encouraged.   advice diabetic yearly podiatry, ophthalmology , nutritionist , dental check q 6 months, ?   ?  ?HLD : is on crestor.  ?recheck FLP, check LFT's work on diet, SE of meds explained to pt. low fat and high fiber diet explained to pt. ?Problem List Items Addressed This Visit   ? ?  ? Endocrine  ? Diabetes mel

## 2021-12-30 NOTE — Progress Notes (Signed)
Please let pt know this was normal.

## 2022-01-25 ENCOUNTER — Inpatient Hospital Stay: Payer: Medicare HMO | Attending: Oncology

## 2022-01-25 DIAGNOSIS — D500 Iron deficiency anemia secondary to blood loss (chronic): Secondary | ICD-10-CM | POA: Insufficient documentation

## 2022-01-25 DIAGNOSIS — K626 Ulcer of anus and rectum: Secondary | ICD-10-CM | POA: Diagnosis not present

## 2022-01-25 DIAGNOSIS — Z7984 Long term (current) use of oral hypoglycemic drugs: Secondary | ICD-10-CM | POA: Insufficient documentation

## 2022-01-25 DIAGNOSIS — Z79899 Other long term (current) drug therapy: Secondary | ICD-10-CM | POA: Diagnosis not present

## 2022-01-25 DIAGNOSIS — K625 Hemorrhage of anus and rectum: Secondary | ICD-10-CM | POA: Diagnosis not present

## 2022-01-25 LAB — CBC WITH DIFFERENTIAL/PLATELET
Abs Immature Granulocytes: 0.03 10*3/uL (ref 0.00–0.07)
Basophils Absolute: 0 10*3/uL (ref 0.0–0.1)
Basophils Relative: 1 %
Eosinophils Absolute: 0.1 10*3/uL (ref 0.0–0.5)
Eosinophils Relative: 2 %
HCT: 43.8 % (ref 39.0–52.0)
Hemoglobin: 14.1 g/dL (ref 13.0–17.0)
Immature Granulocytes: 1 %
Lymphocytes Relative: 29 %
Lymphs Abs: 1.8 10*3/uL (ref 0.7–4.0)
MCH: 27.6 pg (ref 26.0–34.0)
MCHC: 32.2 g/dL (ref 30.0–36.0)
MCV: 85.7 fL (ref 80.0–100.0)
Monocytes Absolute: 0.4 10*3/uL (ref 0.1–1.0)
Monocytes Relative: 7 %
Neutro Abs: 3.6 10*3/uL (ref 1.7–7.7)
Neutrophils Relative %: 60 %
Platelets: 271 10*3/uL (ref 150–400)
RBC: 5.11 MIL/uL (ref 4.22–5.81)
RDW: 15.4 % (ref 11.5–15.5)
WBC: 6 10*3/uL (ref 4.0–10.5)
nRBC: 0 % (ref 0.0–0.2)

## 2022-01-25 LAB — RETIC PANEL
Immature Retic Fract: 9.4 % (ref 2.3–15.9)
RBC.: 5.09 MIL/uL (ref 4.22–5.81)
Retic Count, Absolute: 53.4 10*3/uL (ref 19.0–186.0)
Retic Ct Pct: 1.1 % (ref 0.4–3.1)
Reticulocyte Hemoglobin: 33.6 pg (ref >27.9)

## 2022-01-25 LAB — IRON AND TIBC
Iron: 71 ug/dL (ref 45–182)
Saturation Ratios: 17 % — ABNORMAL LOW (ref 17.9–39.5)
TIBC: 409 ug/dL (ref 250–450)
UIBC: 338 ug/dL

## 2022-01-25 LAB — FERRITIN: Ferritin: 28 ng/mL (ref 24–336)

## 2022-01-26 ENCOUNTER — Inpatient Hospital Stay: Payer: Medicare HMO

## 2022-01-26 ENCOUNTER — Encounter: Payer: Self-pay | Admitting: Oncology

## 2022-01-26 ENCOUNTER — Inpatient Hospital Stay: Payer: Medicare HMO | Admitting: Oncology

## 2022-01-26 VITALS — BP 104/68 | HR 67 | Temp 97.5°F | Resp 20 | Wt 198.4 lb

## 2022-01-26 DIAGNOSIS — D500 Iron deficiency anemia secondary to blood loss (chronic): Secondary | ICD-10-CM | POA: Diagnosis not present

## 2022-01-26 DIAGNOSIS — Z79899 Other long term (current) drug therapy: Secondary | ICD-10-CM | POA: Diagnosis not present

## 2022-01-26 DIAGNOSIS — K626 Ulcer of anus and rectum: Secondary | ICD-10-CM | POA: Diagnosis not present

## 2022-01-26 DIAGNOSIS — K625 Hemorrhage of anus and rectum: Secondary | ICD-10-CM | POA: Diagnosis not present

## 2022-01-26 DIAGNOSIS — Z7984 Long term (current) use of oral hypoglycemic drugs: Secondary | ICD-10-CM | POA: Diagnosis not present

## 2022-01-26 NOTE — Progress Notes (Signed)
?Hematology/Oncology Progress note ?Telephone:([phone]) [phone] Fax:(336) [phone] ?  ? ?   ? ? ?Patient Care Team: ?Loura PardonVigg, Avanti, MD as PCP - General (Internal Medicine) ?Leanna SatoMiles, Linda M, MD (Family Medicine) ?Rickard PatienceYu, Jennier Schissler, MD as Consulting Physician (Hematology and Oncology) ?Toney ReilVanga, Rohini Reddy, MD as Consulting Physician (Gastroenterology) ?Iran OuchArida, Muhammad A, MD as Consulting Physician (Cardiology) ? ?REFERRING PROVIDER: ?Loura PardonVigg, Avanti, MD  ?CHIEF COMPLAINTS/REASON FOR VISIT:  ?Follow-up for iron deficiency anemia ?HISTORY OF PRESENTING ILLNESS:  ? ?Shane Preston is a  61 y.o.  male with PMH listed below was seen in consultation at the request of  Vigg, Avanti, MD  for evaluation of anemia ?10/13/2021, hemoglobin 10.4, MCV 80. ?10/14/2021, iron panel showed iron saturation 7, ferritin 8, TIBC 417. ?10/21/2021, patient had a CBC done which showed a hemoglobin of 9.8, MCV 81. ?Review previous lab results.  Anemia is new onset, since October 2022. ?Patient reports painless rectal bleeding.  Has been referred to establish care with gastroenterology Dr. Allegra LaiVanga and has a colonoscopy scheduled. ? ? ?INTERVAL HISTORY ?[name] is a 61 y.o. male who has above history reviewed by me today presents for follow up visit for Iron deficiency anemia ?Today patient reports feeling well.  Status post IV Venofer treatments. ?Fatigue has improved. ?Denies any additional episodes of bloody stool. ?10/30/2021, patient had colonoscopy done by Dr. Allegra LaiVanga colonoscopy showed poor preparation of the colon.  Solitary ulcer in the distal rectum.  Patient was recommended to follow-up in 6 months with 2-day prep. ? ?Review of Systems  ?Constitutional:  Negative for appetite change, chills, fatigue, fever and unexpected weight change.  ?HENT:   Negative for hearing loss and voice change.   ?Eyes:  Negative for eye problems and icterus.  ?Respiratory:  Negative for chest tightness, cough and shortness of breath.   ?Cardiovascular:   Negative for chest pain and leg swelling.  ?Gastrointestinal:  Negative for abdominal distention, abdominal pain and blood in stool.  ?Endocrine: Negative for hot flashes.  ?Genitourinary:  Negative for difficulty urinating, dysuria and frequency.   ?Musculoskeletal:  Negative for arthralgias.  ?Skin:  Negative for itching and rash.  ?Neurological:  Negative for light-headedness and numbness.  ?Hematological:  Negative for adenopathy. Does not bruise/bleed easily.  ?Psychiatric/Behavioral:  Negative for confusion.   ? ?MEDICAL HISTORY:  ?Past Medical History:  ?Diagnosis Date  ? Anemia   ? Coronary artery disease   ? Diabetes mellitus without complication (HCC)   ? Hemorrhoids   ? Hyperlipidemia   ? Hypertension   ? Lumbar stenosis   ? Neuropathy   ? Peripheral vascular disease (HCC)   ? TIA (transient ischemic attack)   ? Tinea pedis   ? ? ?SURGICAL HISTORY: ?Past Surgical History:  ?Procedure Laterality Date  ? CARDIAC [facility]IZATION    ? [facility]   ? COLONOSCOPY WITH PROPOFOL N/A 10/30/2021  ? Procedure: COLONOSCOPY WITH PROPOFOL;  Surgeon: Toney ReilVanga, Rohini Reddy, MD;  [facility];  Service: Gastroenterology;  Laterality: N/A;  ? EYE SURGERY    ? HERNIA REPAIR  10/11/2005  ? lumbar decompression surgery  10/11/2008  ? ? ?SOCIAL HISTORY: ?Social History  ? ?Socioeconomic History  ? Marital status: Married  ?  Spouse name: Not on file  ? Number of children: Not on file  ? Years of education: Not on file  ? Highest education level: Not on file  ?Occupational History  ? Not on file  ?Tobacco Use  ? Smoking status: Never  ? Smokeless tobacco: Never  ?  Vaping Use  ? Vaping Use: Never used  ?Substance and Sexual Activity  ? Alcohol use: Not Currently  ? Drug use: Not Currently  ? Sexual activity: Not Currently  ?Other Topics Concern  ? Not on file  ?Social History Narrative  ? ** Merged History Encounter **  ?    ? ?Social Determinants of Health  ? ?Financial Resource Strain: Low Risk   ? Difficulty of  Paying Living Expenses: Not hard at all  ?Food Insecurity: No Food Insecurity  ? Worried About Programme researcher, broadcasting/film/video in the Last Year: Never true  ? Ran Out of Food in the Last Year: Never true  ?Transportation Needs: No Transportation Needs  ? Lack of Transportation (Medical): No  ? Lack of Transportation (Non-Medical): No  ?Physical Activity: Inactive  ? Days of Exercise per Week: 0 days  ? Minutes of Exercise per Session: 0 min  ?Stress: No Stress Concern Present  ? Feeling of Stress : Not at all  ?Social Connections: Moderately Isolated  ? Frequency of Communication with Friends and Family: More than three times a week  ? Frequency of Social Gatherings with Friends and Family: More than three times a week  ? Attends Religious Services: Never  ? Active Member of Clubs or Organizations: No  ? Attends Banker Meetings: Never  ? Marital Status: Married  ?Intimate Partner Violence: Not At Risk  ? Fear of Current or Ex-Partner: No  ? Emotionally Abused: No  ? Physically Abused: No  ? Sexually Abused: No  ? ? ?FAMILY HISTORY: ?Family History  ?Family history unknown: Yes  ? ? ?ALLERGIES:  has No Known Allergies. ? ?MEDICATIONS:  ?Current Outpatient Medications  ?Medication Sig Dispense Refill  ? carvedilol (COREG) 25 MG tablet Take 1 tablet (25 mg total) by mouth 2 (two) times daily. 180 tablet 0  ? gabapentin (NEURONTIN) 300 MG capsule Take 1 capsule (300 mg total) by mouth 3 (three) times daily. 90 capsule 1  ? glucose blood test strip Use as instructed 100 each 12  ? Iron, Ferrous Sulfate, 325 (65 Fe) MG TABS Take 325 mg by mouth in the morning and at bedtime. 60 tablet 2  ? JARDIANCE 25 MG TABS tablet Take 1 tablet (25 mg total) by mouth daily. 90 tablet 0  ? levothyroxine (SYNTHROID) 25 MCG tablet Take 1 tablet (25 mcg total) by mouth every morning. 90 tablet 3  ? lisinopril-hydrochlorothiazide (ZESTORETIC) 20-12.5 MG tablet Take 1 tablet by mouth daily. 90 tablet 0  ? Magnesium Oxide -Mg Supplement 250  MG TABS Take 1 tablet by mouth daily.    ? metFORMIN (GLUCOPHAGE) 1000 MG tablet Take 1 tablet (1,000 mg total) by mouth 2 (two) times daily. 180 tablet 0  ? nitroGLYCERIN (NITROSTAT) 0.4 MG SL tablet Place 0.4 mg under the tongue every 5 (five) minutes as needed for chest pain.    ? rosuvastatin (CRESTOR) 20 MG tablet Take 1 tablet (20 mg total) by mouth daily. 30 tablet 6  ? Semaglutide, 1 MG/DOSE, (OZEMPIC, 1 MG/DOSE,) 4 MG/3ML SOPN Inject 1 mg as directed once a week. 3 mL 0  ? ?No current facility-administered medications for this visit.  ? ? ? ?PHYSICAL EXAMINATION: ?ECOG PERFORMANCE STATUS: 1 - Symptomatic but completely ambulatory ?Vitals:  ? 01/26/22 1325  ?BP: 104/68  ?Pulse: 67  ?Resp: 20  ?Temp: (!) 97.5 ?F (36.4 ?C)  ?SpO2: 97%  ? ?Filed Weights  ? 01/26/22 1325  ?Weight: 198 lb 6.4 oz (90  kg)  ? ? ?Physical Exam ?Constitutional:   ?   General: He is not in acute distress. ?HENT:  ?   Head: Normocephalic and atraumatic.  ?Eyes:  ?   General: No scleral icterus. ?Cardiovascular:  ?   Rate and Rhythm: Normal rate and regular rhythm.  ?   Heart sounds: Normal heart sounds.  ?Pulmonary:  ?   Effort: Pulmonary effort is normal. No respiratory distress.  ?   Breath sounds: No wheezing.  ?Abdominal:  ?   General: Bowel sounds are normal. There is no distension.  ?   Palpations: Abdomen is soft.  ?Musculoskeletal:     ?   General: No deformity. Normal range of motion.  ?   Cervical back: Normal range of motion and neck supple.  ?Skin: ?   General: Skin is warm and dry.  ?   Findings: No erythema or rash.  ?Neurological:  ?   Mental Status: He is alert and oriented to person, place, and time. Mental status is at baseline.  ?   Cranial Nerves: No cranial nerve deficit.  ?   Coordination: Coordination normal.  ?Psychiatric:     ?   Mood and Affect: Mood normal.  ? ? ?LABORATORY DATA:  ?I have reviewed the data as listed ?Lab Results  ?Component Value Date  ? WBC 6.0 01/25/2022  ? HGB 14.1 01/25/2022  ? HCT 43.8  01/25/2022  ? MCV 85.7 01/25/2022  ? PLT 271 01/25/2022  ? ?Recent Labs  ?  05/14/21 ?1047 07/14/21 ?7062 11/18/21 ?1105  ?NA 138 138 137  ?K 4.8 4.8 4.6  ?CL 101 100 100  ?CO2 23 19* 22  ?GLUCOSE 118* 122* 9

## 2022-01-26 NOTE — Progress Notes (Signed)
Patient here for follow up he is wanting to know his plan of care. VSS and WNL no complaints today ?

## 2022-01-27 DIAGNOSIS — H26491 Other secondary cataract, right eye: Secondary | ICD-10-CM | POA: Diagnosis not present

## 2022-01-27 DIAGNOSIS — H2512 Age-related nuclear cataract, left eye: Secondary | ICD-10-CM | POA: Diagnosis not present

## 2022-01-27 DIAGNOSIS — E119 Type 2 diabetes mellitus without complications: Secondary | ICD-10-CM | POA: Diagnosis not present

## 2022-01-27 DIAGNOSIS — Z0100 Encounter for examination of eyes and vision without abnormal findings: Secondary | ICD-10-CM | POA: Diagnosis not present

## 2022-02-11 ENCOUNTER — Other Ambulatory Visit: Payer: Self-pay | Admitting: Internal Medicine

## 2022-02-11 DIAGNOSIS — E119 Type 2 diabetes mellitus without complications: Secondary | ICD-10-CM

## 2022-02-11 DIAGNOSIS — D649 Anemia, unspecified: Secondary | ICD-10-CM

## 2022-02-11 NOTE — Telephone Encounter (Signed)
Requested Prescriptions  ?Pending Prescriptions Disp Refills  ?? OZEMPIC, 1 MG/DOSE, 4 MG/3ML SOPN [Pharmacy Med Name: OZEMPIC 1 MG/DOSE (4 MG/3 ML)] 3 mL 0  ?  Sig: Inject 1 mg as directed once a week.  ?  ? Endocrinology:  Diabetes - GLP-1 Receptor Agonists - semaglutide Failed - 02/11/2022  9:28 AM  ?  ?  Failed - HBA1C in normal range and within 180 days  ?  HB A1C (BAYER DCA - WAIVED)  ?Date Value Ref Range Status  ?12/30/2021 6.2 (H) 4.8 - 5.6 % Final  ?  Comment:  ?           Prediabetes: 5.7 - 6.4 ?         Diabetes: >6.4 ?         Glycemic control for adults with diabetes: <7.0 ?  ?   ?  ?  Passed - Cr in normal range and within 360 days  ?  Creatinine  ?Date Value Ref Range Status  ?01/10/2014 0.82 0.60 - 1.30 mg/dL Final  ? ?Creatinine, Ser  ?Date Value Ref Range Status  ?11/18/2021 0.98 0.76 - 1.27 mg/dL Final  ?   ?  ?  Passed - Valid encounter within last 6 months  ?  Recent Outpatient Visits   ?      ? 1 month ago Primary hypertension  ? [facility] Vigg, Avanti, MD  ? 2 months ago Anemia, unspecified type  ? [facility] Vigg, Avanti, MD  ? 3 months ago Anemia, unspecified type  ? [facility] Vigg, Avanti, MD  ? 4 months ago BRBPR (bright red blood per rectum)  ? [facility] Vigg, Avanti, MD  ? 6 months ago Diabetes mellitus without complication (HCC)  ? [facility], Jake Church, NP  ?  ?  ?Future Appointments   ?        ? In 1 month Mecum, Erin E, PA-C [name], [facility]  ?  ? ?  ?  ?  ?? ferrous sulfate 324 (65 Fe) MG TBEC [Pharmacy Med Name: FERROUS SULF EC 324 MG TABLET] 60 tablet 0  ?  Sig: Take 325 mg by mouth in the morning and at bedtime.  ?  ? Endocrinology:  Minerals - Iron Supplementation Passed - 02/11/2022  9:28 AM  ?  ?  Passed - HGB in normal range and within 360 days  ?  Hemoglobin  ?Date Value Ref Range Status  ?01/25/2022 14.1 13.0 - 17.0 g/dL Final  ?50/06/3817 29.9 13.0 - 17.7 g/dL Final  ?   ?  ?  Passed  - HCT in normal range and within 360 days  ?  HCT  ?Date Value Ref Range Status  ?01/25/2022 43.8 39.0 - 52.0 % Final  ? ?Hematocrit  ?Date Value Ref Range Status  ?12/30/2021 43.3 37.5 - 51.0 % Final  ?   ?  ?  Passed - RBC in normal range and within 360 days  ?  RBC  ?Date Value Ref Range Status  ?01/25/2022 5.11 4.22 - 5.81 MIL/uL Final  ? ?RBC.  ?Date Value Ref Range Status  ?01/25/2022 5.09 4.22 - 5.81 MIL/uL Final  ?   ?  ?  Passed - Fe (serum) in normal range and within 360 days  ?  Iron  ?Date Value Ref Range Status  ?01/25/2022 71 45 - 182 ug/dL Final  ?37/16/9678 29 (L) 38 - 169 ug/dL Final  ? ?Iron  Saturation  ?Date Value Ref Range Status  ?10/14/2021 7 (LL) 15 - 55 % Final  ? ?Saturation Ratios  ?Date Value Ref Range Status  ?01/25/2022 17 (L) 17.9 - 39.5 % Final  ?   ?  ?  Passed - Ferritin in normal range and within 360 days  ?  Ferritin  ?Date Value Ref Range Status  ?01/25/2022 28 24 - 336 ng/mL Final  ?  Comment:  ?  Performed at [facility], [address] [place], [place] [address]  ?10/14/2021 8 (L) 30 - 400 ng/mL Final  ?   ?  ?  Passed - Valid encounter within last 12 months  ?  Recent Outpatient Visits   ?      ? 1 month ago Primary hypertension  ? [facility] Vigg, Avanti, MD  ? 2 months ago Anemia, unspecified type  ? [facility] Vigg, Avanti, MD  ? 3 months ago Anemia, unspecified type  ? [facility] Vigg, Avanti, MD  ? 4 months ago BRBPR (bright red blood per rectum)  ? [facility] Vigg, Avanti, MD  ? 6 months ago Diabetes mellitus without complication (HCC)  ? [facility], Jake Church, NP  ?  ?  ?Future Appointments   ?        ? In 1 month Mecum, [name], PA-C [facility], [facility]  ?  ? ?  ?  ?  ? ?

## 2022-03-03 ENCOUNTER — Encounter: Payer: Self-pay | Admitting: Oncology

## 2022-03-05 ENCOUNTER — Other Ambulatory Visit: Payer: Self-pay | Admitting: Internal Medicine

## 2022-03-09 NOTE — Telephone Encounter (Signed)
Requested medication (s) are due for refill today: yes  Requested medication (s) are on the active medication list: no  Last refill:  12/14/21  Future visit scheduled: yes  Notes to clinic:  rx not on pt's med list. Please assess for refill d/t not delegated.      Requested Prescriptions  Pending Prescriptions Disp Refills   Vitamin D, Ergocalciferol, (DRISDOL) 1.25 MG (50000 UNIT) CAPS capsule [Pharmacy Med Name: VITAMIN D2 1.25MG (50,000 UNIT)] 12 capsule 0    Sig: TAKE 1 CAPSULE 1 TIME A WEEK     Endocrinology:  Vitamins - Vitamin D Supplementation 2 Failed - 03/05/2022  9:54 AM      Failed - Manual Review: Route requests for 50,000 IU strength to the provider      Failed - Vitamin D in normal range and within 360 days    No results found for: [id number], [id number], [id number], 25OHVITD3, 25OHVITD2, 25OHVITD3, 25OHVITD2, 25OHVITD1, 25OHVITD2, 25OHVITD3, VD25OH       Passed - Ca in normal range and within 360 days    Calcium  Date Value Ref Range Status  11/18/2021 9.5 8.6 - 10.2 mg/dL Final   Calcium, Total  Date Value Ref Range Status  01/10/2014 8.5 8.5 - 10.1 mg/dL Final   Calcium, Ion  Date Value Ref Range Status  11/23/2014 1.19 1.12 - 1.23 mmol/L Final         Passed - Valid encounter within last 12 months    Recent Outpatient Visits           2 months ago Primary hypertension   [name] Vigg, Avanti, MD   3 months ago Anemia, unspecified type   [name] Vigg, Avanti, MD   4 months ago Anemia, unspecified type   [name] Vigg, Avanti, MD   4 months ago BRBPR (bright red blood per rectum)   [name] Vigg, Avanti, MD   7 months ago Diabetes mellitus without complication (HCC)   [name] McElwee, Lauren A, NP       Future Appointments             In 6 days Mecum, [name], PA-C [facility], [facility]

## 2022-03-15 ENCOUNTER — Other Ambulatory Visit: Payer: Self-pay | Admitting: Internal Medicine

## 2022-03-15 ENCOUNTER — Encounter: Payer: Self-pay | Admitting: Physician Assistant

## 2022-03-15 ENCOUNTER — Ambulatory Visit (INDEPENDENT_AMBULATORY_CARE_PROVIDER_SITE_OTHER): Payer: Medicare (Managed Care) | Admitting: Physician Assistant

## 2022-03-15 VITALS — BP 111/73 | HR 58 | Temp 97.7°F | Ht 61.02 in | Wt 197.4 lb

## 2022-03-15 DIAGNOSIS — E782 Mixed hyperlipidemia: Secondary | ICD-10-CM

## 2022-03-15 DIAGNOSIS — E119 Type 2 diabetes mellitus without complications: Secondary | ICD-10-CM | POA: Diagnosis not present

## 2022-03-15 DIAGNOSIS — D509 Iron deficiency anemia, unspecified: Secondary | ICD-10-CM

## 2022-03-15 DIAGNOSIS — I10 Essential (primary) hypertension: Secondary | ICD-10-CM | POA: Diagnosis not present

## 2022-03-15 MED ORDER — JARDIANCE 25 MG PO TABS: 25.0000 mg | ORAL_TABLET | Freq: Every day | ORAL | 0 refills | Status: DC

## 2022-03-15 MED ORDER — LISINOPRIL-HYDROCHLOROTHIAZIDE 20-12.5 MG PO TABS: 1.0000 | ORAL_TABLET | Freq: Every day | ORAL | 0 refills | Status: DC

## 2022-03-15 MED ORDER — METFORMIN HCL 1000 MG PO TABS: 1000.0000 mg | ORAL_TABLET | Freq: Two times a day (BID) | ORAL | 0 refills | Status: DC

## 2022-03-15 MED ORDER — GABAPENTIN 300 MG PO CAPS: 300.0000 mg | ORAL_CAPSULE | Freq: Three times a day (TID) | ORAL | 1 refills | Status: DC

## 2022-03-15 MED ORDER — CARVEDILOL 25 MG PO TABS: 25.0000 mg | ORAL_TABLET | Freq: Two times a day (BID) | ORAL | 0 refills | Status: DC

## 2022-03-15 MED ORDER — OZEMPIC (1 MG/DOSE) 4 MG/3ML ~~LOC~~ SOPN: 1.0000 mg | PEN_INJECTOR | SUBCUTANEOUS | 0 refills | Status: DC

## 2022-03-15 NOTE — Assessment & Plan Note (Addendum)
Historic condition, unsure of chronicity Most recent labs indicate anemia is resolved and iron levels are in normal range.  He states he is not taking iron supplements any longer Recommend monitoring at this time Repeat CBC in 3 months or sooner if having symptoms

## 2022-03-15 NOTE — Assessment & Plan Note (Signed)
Chronic, historic condition Appears stable and well managed with Rosuvastatin 20 mg PO QD  Continue current medications Cholesterol in goal for concurrent DM dx  Repeat Lipid panel in 3 months for monitoring  Encouraged him to continue walking daily to assist with management Follow up in 3 months

## 2022-03-15 NOTE — Assessment & Plan Note (Addendum)
Chronic, stable Appears well controlled on Carvedilol 25 mg  and Lisinopril-HCTZ 20-12.5 mg  Reports excellent compliance with medication, taking BP at home daily and results are in goal Continue current medications Encouraged to continue walking every day to assist with CV health Follow up in 3 months

## 2022-03-15 NOTE — Assessment & Plan Note (Signed)
Chronic, historic condition Appears stable on metformin 1000 mg PO BID, Jardiance 25 mg PO QD, and Ozempic  Continue current medications Follow up in 3 months to repeat A1c, CMP, Vitamin B12 and Vitamin D for monitoring

## 2022-03-15 NOTE — Progress Notes (Signed)
Established Patient Office Visit  Name: Shane Preston   MRN: [id number]    DOB: Dec 06, 1960   Date:03/15/2022  Today's Provider: Talitha Givens, MHS, PA-C Introduced myself to the patient as a PA-C and provided education on APPs in clinical practice.         Subjective  Chief Complaint  Chief Complaint  Patient presents with   Hypertension   Diabetes   Anemia   Hyperlipidemia    Hypertension Pertinent negatives include no blurred vision, chest pain, headaches, malaise/fatigue, neck pain, palpitations or PND.  Diabetes Pertinent negatives for hypoglycemia include no dizziness or headaches. Pertinent negatives for diabetes include no blurred vision, no chest pain and no weight loss.  Anemia There has been no malaise/fatigue, palpitations or weight loss.  Hyperlipidemia Pertinent negatives include no chest pain.    States he is doing well   HYPERTENSION / HYPERLIPIDEMIA Satisfied with current treatment? yes Duration of hypertension: chronic BP monitoring frequency: daily BP range: 110s/70s-80s BP medication side effects: no Past BP meds: carvedilol and lisinopril-HCTZ Duration of hyperlipidemia: chronic Cholesterol medication side effects: no Cholesterol supplements: none Past cholesterol medications: rosuvastatin (crestor) Medication compliance: excellent compliance Aspirin: no Recent stressors: no Recurrent headaches: no Visual changes: no Palpitations: no Dyspnea: no Chest pain: no Lower extremity edema: no Dizzy/lightheaded: no   Diabetes, Type 2 - Last A1c : 6.2 - Medications: Metformin 1000 mg BID, Jardiance 25 mg PO QD, Ozempic  - Compliance: excellent  - Checking BG at home: yes, 3 times per day  - Diet: Regular diet, trying to reduce sugar content  - Exercise: walking every day for 30 minutes  - Eye exam: Went to the eye doctor 3 weeks ago- went to a place in [place] [phone] ([place] exam: up to date -  Microalbumin: Performed  - Statin: yes  - PNA vaccine: NA  - Denies symptoms of hypoglycemia, polyuria, polydipsia, numbness extremities, foot ulcers/trauma   Anemia Fatigue: no  Previous labs: normal hemoglobin and RBC  Able to walk for 30 minutes per day without concerns He is no longer taking iron supplements    Patient Active Problem List   Diagnosis Date Noted   Rectal ulcer 01/26/2022   Grade I hemorrhoids    IDA (iron deficiency anemia) 10/28/2021   Rectal bleeding 10/28/2021   Bilateral hearing loss 10/28/2021   Anemia 10/21/2021   Back pain 05/14/2021   Diabetes mellitus without complication ([place]) 94/50/3888   Hypothyroidism 05/14/2021   Acquired hypothyroidism 01/15/2019   Mixed hyperlipidemia 02/24/2016   Peripheral sensory neuropathy due to type 2 diabetes mellitus ([place]) 02/24/2016   Chest pain- hx of Nl cors 2013, negative Myoview 4/15 02/06/2014   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   HTN (hypertension) 02/06/2014   Normal coronary arteries- Feb 2012 02/06/2014   Obesity- BMI 38 02/06/2014    Past Surgical History:  Procedure Laterality Date   [facility]    COLONOSCOPY WITH PROPOFOL N/A 10/30/2021   Procedure: COLONOSCOPY WITH PROPOFOL;  Surgeon: Lin Landsman, MD;  Location: [facility] ENDOSCOPY;  Service: Gastroenterology;  Laterality: N/A;   EYE SURGERY     HERNIA REPAIR  10/11/2005   lumbar decompression surgery  10/11/2008    Family History  Family history unknown: Yes    Social History   Tobacco Use   Smoking status: Never   Smokeless tobacco: Never  Substance Use  Topics   Alcohol use: Not Currently     Current Outpatient Medications:    ferrous sulfate 324 (65 Fe) MG TBEC, Take 325 mg by mouth in the morning and at bedtime., Disp: 60 tablet, Rfl: 2   glucose blood test strip, Use as instructed, Disp: 100 each, Rfl: 12   levothyroxine (SYNTHROID) 25 MCG tablet, Take 1 tablet (25 mcg  total) by mouth every morning., Disp: 90 tablet, Rfl: 3   Magnesium Oxide -Mg Supplement 250 MG TABS, Take 1 tablet by mouth daily., Disp: , Rfl:    nitroGLYCERIN (NITROSTAT) 0.4 MG SL tablet, Place 0.4 mg under the tongue every 5 (five) minutes as needed for chest pain., Disp: , Rfl:    rosuvastatin (CRESTOR) 20 MG tablet, Take 1 tablet (20 mg total) by mouth daily., Disp: 30 tablet, Rfl: 6   carvedilol (COREG) 25 MG tablet, Take 1 tablet (25 mg total) by mouth 2 (two) times daily., Disp: 180 tablet, Rfl: 0   gabapentin (NEURONTIN) 300 MG capsule, Take 1 capsule (300 mg total) by mouth 3 (three) times daily., Disp: 90 capsule, Rfl: 1   JARDIANCE 25 MG TABS tablet, Take 1 tablet (25 mg total) by mouth daily., Disp: 90 tablet, Rfl: 0   lisinopril-hydrochlorothiazide (ZESTORETIC) 20-12.5 MG tablet, Take 1 tablet by mouth daily., Disp: 90 tablet, Rfl: 0   metFORMIN (GLUCOPHAGE) 1000 MG tablet, Take 1 tablet (1,000 mg total) by mouth 2 (two) times daily., Disp: 180 tablet, Rfl: 0   Semaglutide, 1 MG/DOSE, (OZEMPIC, 1 MG/DOSE,) 4 MG/3ML SOPN, Inject 1 mg into the muscle once a week., Disp: 3 mL, Rfl: 0  No Known Allergies  I personally reviewed active problem list, medication list, allergies, health maintenance with the patient/caregiver today.   Review of Systems  Constitutional:  Negative for malaise/fatigue and weight loss.  Eyes:  Negative for blurred vision, double vision and photophobia.  Respiratory:  Negative for sputum production.   Cardiovascular:  Negative for chest pain, palpitations, leg swelling and PND.  Musculoskeletal:  Positive for back pain (lower back pain from surgery). Negative for neck pain.  Neurological:  Negative for dizziness and headaches.     Objective  Vitals:   03/15/22 1010  BP: 111/73  Pulse: (!) 58  Temp: 97.7 F (36.5 C)  TempSrc: Oral  SpO2: 97%  Weight: 197 lb 6.4 oz (89.5 kg)  Height: 5' 1.02" (1.55 m)    Body mass index is 37.27  kg/m.  Physical Exam Vitals reviewed.  Constitutional:      General: He is awake.     Appearance: Normal appearance. He is well-developed and well-groomed. He is obese.  HENT:     Head: Normocephalic and atraumatic.     Mouth/Throat:     Lips: Pink.     Pharynx: Oropharynx is clear. Uvula midline. No pharyngeal swelling, oropharyngeal exudate, posterior oropharyngeal erythema or uvula swelling.  Eyes:     General: Lids are normal. No allergic shiner.    Extraocular Movements: Extraocular movements intact.     Conjunctiva/sclera:     Right eye: Right conjunctiva is not injected. No chemosis or exudate.    Left eye: Left conjunctiva is not injected. No chemosis or exudate.    Pupils: Pupils are equal, round, and reactive to light.  Cardiovascular:     Rate and Rhythm: Normal rate and regular rhythm.     Pulses: Normal pulses.          Radial pulses are 2+ on the right  side and 2+ on the left side.     Heart sounds: Normal heart sounds. No murmur heard.   No gallop.  Pulmonary:     Effort: Pulmonary effort is normal.     Breath sounds: Normal breath sounds and air entry. No decreased breath sounds, wheezing, rhonchi or rales.  Musculoskeletal:     Right lower leg: No edema.     Left lower leg: No edema.  Skin:    General: Skin is warm and dry.  Neurological:     Mental Status: He is alert and oriented to person, place, and time.     GCS: GCS eye subscore is 4. GCS verbal subscore is 5. GCS motor subscore is 6.  Psychiatric:        Attention and Perception: Attention and perception normal.        Mood and Affect: Mood and affect normal.        Speech: Speech normal.        Behavior: Behavior normal. Behavior is cooperative.     Recent Results (from the past 2160 hour(s))  CBC With Differential/Platelet     Status: Abnormal   Collection Time: 12/30/21 10:31 AM  Result Value Ref Range   WBC 6.6 3.4 - 10.8 x10E3/uL   RBC 5.20 4.14 - 5.80 x10E6/uL   Hemoglobin 14.5 13.0 -  17.7 g/dL   Hematocrit 43.3 37.5 - 51.0 %   MCV 83 79 - 97 fL   MCH 27.9 26.6 - 33.0 pg   MCHC 33.5 31.5 - 35.7 g/dL   RDW 16.6 (H) 11.6 - 15.4 %   Platelets 337 150 - 450 x10E3/uL   Neutrophils 63 Not Estab. %   Lymphs 29 Not Estab. %   MID 8 Not Estab. %   Neutrophils Absolute 4.2 1.4 - 7.0 x10E3/uL   Lymphocytes Absolute 1.9 0.7 - 3.1 x10E3/uL   MID (Absolute) 0.5 0.1 - 1.6 X10E3/uL  Bayer DCA Hb A1c Waived     Status: Abnormal   Collection Time: 12/30/21 10:32 AM  Result Value Ref Range   HB A1C (BAYER DCA - WAIVED) 6.2 (H) 4.8 - 5.6 %    Comment:          Prediabetes: 5.7 - 6.4          Diabetes: >6.4          Glycemic control for adults with diabetes: <7.0   Retic Panel     Status: None   Collection Time: 01/25/22 11:04 AM  Result Value Ref Range   Retic Ct Pct 1.1 0.4 - 3.1 %   RBC. 5.09 4.22 - 5.81 MIL/uL   Retic Count, Absolute 53.4 19.0 - 186.0 K/uL   Immature Retic Fract 9.4 2.3 - 15.9 %   Reticulocyte Hemoglobin 33.6 >27.9 pg    Comment:        Given the high negative predictive value of a RET-He result > 32 pg iron deficiency is essentially excluded. If this patient is anemic other etiologies should be considered. Performed at [facility], [address] [place], [place] [address]   Iron and TIBC     Status: Abnormal   Collection Time: 01/25/22 11:04 AM  Result Value Ref Range   Iron 71 45 - 182 ug/dL   TIBC 409 250 - 450 ug/dL   Saturation Ratios 17 (L) 17.9 - 39.5 %   UIBC 338 ug/dL    Comment: Performed at [facility], [address] [place], [place]  [address]  Ferritin     Status: None   Collection Time: 01/25/22 11:04 AM  Result Value Ref Range   Ferritin 28 24 - 336 ng/mL    Comment: Performed at [facility], [address] [place], [place] [address]  CBC with Differential/Platelet     Status: None   Collection Time: 01/25/22 11:04 AM  Result Value Ref Range   WBC 6.0 4.0 - 10.5 K/uL   RBC 5.11 4.22 - 5.81  MIL/uL   Hemoglobin 14.1 13.0 - 17.0 g/dL   HCT 43.8 39.0 - 52.0 %   MCV 85.7 80.0 - 100.0 fL   MCH 27.6 26.0 - 34.0 pg   MCHC 32.2 30.0 - 36.0 g/dL   RDW 15.4 11.5 - 15.5 %   Platelets 271 150 - 400 K/uL   nRBC 0.0 0.0 - 0.2 %   Neutrophils Relative % 60 %   Neutro Abs 3.6 1.7 - 7.7 K/uL   Lymphocytes Relative 29 %   Lymphs Abs 1.8 0.7 - 4.0 K/uL   Monocytes Relative 7 %   Monocytes Absolute 0.4 0.1 - 1.0 K/uL   Eosinophils Relative 2 %   Eosinophils Absolute 0.1 0.0 - 0.5 K/uL   Basophils Relative 1 %   Basophils Absolute 0.0 0.0 - 0.1 K/uL   Immature Granulocytes 1 %   Abs Immature Granulocytes 0.03 0.00 - 0.07 K/uL    Comment: Performed at [facility] [address] [place], [place] [address]     PHQ2/9:    03/15/2022   10:19 AM 11/18/2021   10:42 AM 09/01/2021    8:32 AM 07/21/2021   10:22 AM 05/14/2021   10:13 AM  Depression screen PHQ 2/9  Decreased Interest 0 0 0 0 0  Down, Depressed, Hopeless 0 0 0 0 0  PHQ - 2 Score 0 0 0 0 0  Altered sleeping 0 0   0  Tired, decreased energy 0 0   0  Change in appetite 0 0   0  Feeling bad or failure about yourself  0 0   0  Trouble concentrating 0 0   0  Moving slowly or fidgety/restless 0 0   0  Suicidal thoughts 0 0   0  PHQ-9 Score 0 0   0  Difficult doing work/chores Not difficult at all Not difficult at all         Fall Risk:    03/15/2022   10:19 AM 11/18/2021   10:41 AM 09/01/2021    8:20 AM 07/21/2021   10:22 AM 05/14/2021   10:13 AM  Fall Risk   Falls in the past year? 0 0 0 0 0  Number falls in past yr: 0 0 0 0 0  Injury with Fall? 0 0 0 0 0  Risk for fall due to : No Fall Risks No Fall Risks  No Fall Risks No Fall Risks  Follow up Falls evaluation completed Falls evaluation completed Falls evaluation completed Falls evaluation completed Falls evaluation completed      Functional Status Survey:      Assessment & Plan  Problem List Items Addressed This Visit       Cardiovascular and  Mediastinum   HTN (hypertension) - Primary (Chronic)    Chronic, stable Appears well controlled on Carvedilol 25 mg  and Lisinopril-HCTZ 20-12.5 mg  Reports excellent compliance with medication, taking BP at home daily and results are in goal Continue current medications Encouraged to continue walking every  day to assist with CV health Follow up in 3 months        Relevant Medications   carvedilol (COREG) 25 MG tablet   lisinopril-hydrochlorothiazide (ZESTORETIC) 20-12.5 MG tablet     Endocrine   Diabetes mellitus without complication (HCC)    Chronic, historic condition Appears stable on metformin 1000 mg PO BID, Jardiance 25 mg PO QD, and Ozempic  Continue current medications Follow up in 3 months to repeat A1c, CMP, Vitamin B12 and Vitamin D for monitoring         Relevant Medications   gabapentin (NEURONTIN) 300 MG capsule   JARDIANCE 25 MG TABS tablet   lisinopril-hydrochlorothiazide (ZESTORETIC) 20-12.5 MG tablet   metFORMIN (GLUCOPHAGE) 1000 MG tablet   Semaglutide, 1 MG/DOSE, (OZEMPIC, 1 MG/DOSE,) 4 MG/3ML SOPN     Other   Mixed hyperlipidemia    Chronic, historic condition Appears stable and well managed with Rosuvastatin 20 mg PO QD  Continue current medications Cholesterol in goal for concurrent DM dx  Repeat Lipid panel in 3 months for monitoring  Encouraged him to continue walking daily to assist with management Follow up in 3 months        Relevant Medications   carvedilol (COREG) 25 MG tablet   lisinopril-hydrochlorothiazide (ZESTORETIC) 20-12.5 MG tablet   Anemia   IDA (iron deficiency anemia)    Historic condition, unsure of chronicity Most recent labs indicate anemia is resolved and iron levels are in normal range.  He states he is not taking iron supplements any longer Recommend monitoring at this time Repeat CBC in 3 months or sooner if having symptoms          Return in about 3 months (around 06/15/2022) for HTN, Diabetes follow  up.   I, Rileigh Kawashima E Gerldine Suleiman, PA-C, have reviewed all documentation for this visit. The documentation on 03/15/22 for the exam, diagnosis, procedures, and orders are all accurate and complete.   Talitha Givens, MHS, PA-C [facility]

## 2022-03-16 NOTE — Telephone Encounter (Signed)
Requested medication (s) are due for refill today: yes  Requested medication (s) are on the active medication list: no  Last refill:  12/14/21  Future visit scheduled: yes  Notes to clinic:  rx isn't on pt's med list. Not delegated.      Requested Prescriptions  Pending Prescriptions Disp Refills   Vitamin D, Ergocalciferol, (DRISDOL) 1.25 MG (50000 UNIT) CAPS capsule [Pharmacy Med Name: VITAMIN D2 1.25MG (50,000 UNIT)] 12 capsule 0    Sig: TAKE 1 CAPSULE 1 TIME A WEEK     Endocrinology:  Vitamins - Vitamin D Supplementation 2 Failed - 03/15/2022 11:23 AM      Failed - Manual Review: Route requests for 50,000 IU strength to the provider      Failed - Vitamin D in normal range and within 360 days    No results found for: [id number], [id number], [id number], [place], [place], [place], 25OHVITD2, 25OHVITD1, 25OHVITD2, 25OHVITD3, VD25OH       Passed - Ca in normal range and within 360 days    Calcium  Date Value Ref Range Status  11/18/2021 9.5 8.6 - 10.2 mg/dL Final   Calcium, Total  Date Value Ref Range Status  01/10/2014 8.5 8.5 - 10.1 mg/dL Final   Calcium, Ion  Date Value Ref Range Status  11/23/2014 1.19 1.12 - 1.23 mmol/L Final         Passed - Valid encounter within last 12 months    Recent Outpatient Visits           Yesterday Primary hypertension   [name] Mecum, Dani Gobble, PA-C   2 months ago Primary hypertension   [name] Vigg, Avanti, MD   3 months ago Anemia, unspecified type   [place] Vigg, Avanti, MD   4 months ago Anemia, unspecified type   [facility] Vigg, Avanti, MD   5 months ago BRBPR (bright red blood per rectum)   [place], [place]       Future Appointments             In 3 months Kathrine Haddock, NP [facility], [place]

## 2022-04-04 ENCOUNTER — Other Ambulatory Visit: Payer: Self-pay

## 2022-04-04 ENCOUNTER — Emergency Department: Payer: Medicare (Managed Care)

## 2022-04-04 ENCOUNTER — Emergency Department
Admission: EM | Admit: 2022-04-04 | Discharge: 2022-04-04 | Disposition: A | Discharge status: Home / Self Care | Payer: Medicare (Managed Care) | Attending: Emergency Medicine | Admitting: Emergency Medicine

## 2022-04-04 ENCOUNTER — Encounter: Payer: Self-pay | Admitting: Oncology

## 2022-04-04 DIAGNOSIS — K529 Noninfective gastroenteritis and colitis, unspecified: Secondary | ICD-10-CM | POA: Diagnosis not present

## 2022-04-04 DIAGNOSIS — R109 Unspecified abdominal pain: Secondary | ICD-10-CM | POA: Diagnosis present

## 2022-04-04 DIAGNOSIS — K922 Gastrointestinal hemorrhage, unspecified: Secondary | ICD-10-CM | POA: Insufficient documentation

## 2022-04-04 DIAGNOSIS — I10 Essential (primary) hypertension: Secondary | ICD-10-CM | POA: Insufficient documentation

## 2022-04-04 DIAGNOSIS — E119 Type 2 diabetes mellitus without complications: Secondary | ICD-10-CM | POA: Insufficient documentation

## 2022-04-04 LAB — CBC
HCT: 44.8 % (ref 39.0–52.0)
Hemoglobin: 14.6 g/dL (ref 13.0–17.0)
MCH: 28.5 pg (ref 26.0–34.0)
MCHC: 32.6 g/dL (ref 30.0–36.0)
MCV: 87.5 fL (ref 80.0–100.0)
Platelets: 281 10*3/uL (ref 150–400)
RBC: 5.12 MIL/uL (ref 4.22–5.81)
RDW: 13.9 % (ref 11.5–15.5)
WBC: 10.3 10*3/uL (ref 4.0–10.5)
nRBC: 0 % (ref 0.0–0.2)

## 2022-04-04 LAB — COMPREHENSIVE METABOLIC PANEL
ALT: 21 U/L (ref 0–44)
AST: 17 U/L (ref 15–41)
Albumin: 4.4 g/dL (ref 3.5–5.0)
Alkaline Phosphatase: 64 U/L (ref 38–126)
Anion gap: 10 (ref 5–15)
BUN: 11 mg/dL (ref 8–23)
CO2: 22 mmol/L (ref 22–32)
Calcium: 9.1 mg/dL (ref 8.9–10.3)
Chloride: 100 mmol/L (ref 98–111)
Creatinine, Ser: 0.96 mg/dL (ref 0.61–1.24)
GFR, Estimated: >60 mL/min (ref >60)
Glucose, Bld: 138 mg/dL — ABNORMAL HIGH (ref 70–99)
Potassium: 3.7 mmol/L (ref 3.5–5.1)
Sodium: 132 mmol/L — ABNORMAL LOW (ref 135–145)
Total Bilirubin: 1.1 mg/dL (ref 0.3–1.2)
Total Protein: 8.2 g/dL — ABNORMAL HIGH (ref 6.5–8.1)

## 2022-04-04 LAB — URINALYSIS, ROUTINE W REFLEX MICROSCOPIC
Bacteria, UA: NONE SEEN
Bilirubin Urine: NEGATIVE
Glucose, UA: >=500 mg/dL — AB
Ketones, ur: 5 mg/dL — AB
Leukocytes,Ua: NEGATIVE
Nitrite: NEGATIVE
Protein, ur: NEGATIVE mg/dL
Specific Gravity, Urine: 1.02 (ref 1.005–1.030)
Squamous Epithelial / HPF: NONE SEEN (ref 0–5)
pH: 6 (ref 5.0–8.0)

## 2022-04-04 LAB — LIPASE, BLOOD: Lipase: 43 U/L (ref 11–51)

## 2022-04-04 MED ORDER — IOHEXOL 300 MG/ML  SOLN
100.0000 mL | Freq: Once | INTRAMUSCULAR | Status: AC | PRN
  Administered 2022-04-04: 100 mL via INTRAVENOUS

## 2022-04-04 MED ORDER — LACTATED RINGERS IV BOLUS
1000.0000 mL | Freq: Once | INTRAVENOUS | Status: AC
  Administered 2022-04-04: 1000 mL via INTRAVENOUS

## 2022-04-05 ENCOUNTER — Inpatient Hospital Stay: Payer: Medicare (Managed Care) | Attending: Oncology

## 2022-04-05 ENCOUNTER — Other Ambulatory Visit: Payer: Self-pay

## 2022-04-05 ENCOUNTER — Telehealth: Payer: Self-pay | Admitting: Emergency Medicine

## 2022-04-05 DIAGNOSIS — K625 Hemorrhage of anus and rectum: Secondary | ICD-10-CM

## 2022-04-05 DIAGNOSIS — D500 Iron deficiency anemia secondary to blood loss (chronic): Secondary | ICD-10-CM | POA: Diagnosis present

## 2022-04-05 DIAGNOSIS — K626 Ulcer of anus and rectum: Secondary | ICD-10-CM | POA: Insufficient documentation

## 2022-04-05 LAB — CBC WITH DIFFERENTIAL/PLATELET
Abs Immature Granulocytes: 0.01 10*3/uL (ref 0.00–0.07)
Basophils Absolute: 0 10*3/uL (ref 0.0–0.1)
Basophils Relative: 1 %
Eosinophils Absolute: 0.1 10*3/uL (ref 0.0–0.5)
Eosinophils Relative: 2 %
HCT: 38.9 % — ABNORMAL LOW (ref 39.0–52.0)
Hemoglobin: 13.2 g/dL (ref 13.0–17.0)
Immature Granulocytes: 0 %
Lymphocytes Relative: 24 %
Lymphs Abs: 1.3 10*3/uL (ref 0.7–4.0)
MCH: 29.3 pg (ref 26.0–34.0)
MCHC: 33.9 g/dL (ref 30.0–36.0)
MCV: 86.3 fL (ref 80.0–100.0)
Monocytes Absolute: 0.6 10*3/uL (ref 0.1–1.0)
Monocytes Relative: 12 %
Neutro Abs: 3.3 10*3/uL (ref 1.7–7.7)
Neutrophils Relative %: 61 %
Platelets: 276 10*3/uL (ref 150–400)
RBC: 4.51 MIL/uL (ref 4.22–5.81)
RDW: 13.7 % (ref 11.5–15.5)
WBC: 5.3 10*3/uL (ref 4.0–10.5)
nRBC: 0 % (ref 0.0–0.2)

## 2022-04-05 LAB — IRON AND TIBC
Iron: 44 ug/dL — ABNORMAL LOW (ref 45–182)
Saturation Ratios: 13 % — ABNORMAL LOW (ref 17.9–39.5)
TIBC: 344 ug/dL (ref 250–450)
UIBC: 300 ug/dL

## 2022-04-05 LAB — FERRITIN: Ferritin: 87 ng/mL (ref 24–336)

## 2022-04-06 ENCOUNTER — Encounter: Payer: Self-pay | Admitting: Gastroenterology

## 2022-04-06 ENCOUNTER — Ambulatory Visit (INDEPENDENT_AMBULATORY_CARE_PROVIDER_SITE_OTHER): Payer: Medicare (Managed Care) | Admitting: Gastroenterology

## 2022-04-06 ENCOUNTER — Other Ambulatory Visit: Payer: Self-pay

## 2022-04-06 VITALS — BP 101/64 | HR 64 | Temp 97.7°F | Ht 61.02 in | Wt 194.1 lb

## 2022-04-06 DIAGNOSIS — K640 First degree hemorrhoids: Secondary | ICD-10-CM | POA: Diagnosis not present

## 2022-04-06 DIAGNOSIS — R140 Abdominal distension (gaseous): Secondary | ICD-10-CM

## 2022-04-06 DIAGNOSIS — K625 Hemorrhage of anus and rectum: Secondary | ICD-10-CM

## 2022-04-06 DIAGNOSIS — D500 Iron deficiency anemia secondary to blood loss (chronic): Secondary | ICD-10-CM

## 2022-04-06 DIAGNOSIS — R195 Other fecal abnormalities: Secondary | ICD-10-CM

## 2022-04-06 MED ORDER — NA SULFATE-K SULFATE-MG SULF 17.5-3.13-1.6 GM/177ML PO SOLN: 354.0000 mL | Freq: Once | ORAL | 0 refills | Status: AC

## 2022-04-07 ENCOUNTER — Telehealth: Payer: Self-pay | Admitting: *Deleted

## 2022-04-07 NOTE — Telephone Encounter (Signed)
Transition Care Management Follow-up Telephone Call Date of discharge and from where: [facility] 04-04-2022 How have you been since you were released from the hospital? Feeling good Any questions or concerns? No  Items Reviewed: Did the pt receive and understand the discharge instructions provided? Yes  Medications obtained and verified?  Other? No  Any new allergies since your discharge? No  Dietary orders reviewed? No Do you have support at home? Yes   Home Care and Equipment/Supplies: Were home health services ordered?  If so, what is the name of the agency?   Has the agency set up a time to come to the patient's home?  Were any new equipment or medical supplies ordered?   What is the name of the medical supply agency?  Were you able to get the supplies/equipment?  Do you have any questions related to the use of the equipment or supplies?   Functional Questionnaire: (I = Independent and D = Dependent) ADLs: I  Bathing/Dressing- I  Meal Prep- I  Eating- I  Maintaining continence- I  Transferring/Ambulation- I  Managing Meds- I  Follow up appointments reviewed:  PCP Hospital f/u appt confirmed? No  . Specialist Hospital f/u appt confirmed?  Gastro on 6-27  scheduled for colonoscopy 04-20-2022 Are transportation arrangements needed? No  If their condition worsens, is the pt aware to call PCP or go to the Emergency Dept.? Yes Was the patient provided with contact information for the PCP's office or ED? Yes Was to pt encouraged to call back with questions or concerns? Yes

## 2022-04-08 ENCOUNTER — Encounter: Payer: Self-pay | Admitting: Gastroenterology

## 2022-04-19 ENCOUNTER — Other Ambulatory Visit: Payer: Self-pay

## 2022-04-19 DIAGNOSIS — E119 Type 2 diabetes mellitus without complications: Secondary | ICD-10-CM

## 2022-04-19 NOTE — Telephone Encounter (Addendum)
Patient requesting refill for Ozempic 1mg  dose, 4mg /11ml pen Refill 2  Last seen on 03/15/22 Refilled for 1 pen, 0 refills  Upcoming visit 06/15/22

## 2022-04-20 ENCOUNTER — Encounter
Admission: RE | Disposition: A | Discharge status: Home / Self Care | Payer: Self-pay | Source: Home / Self Care | Attending: Gastroenterology

## 2022-04-20 ENCOUNTER — Ambulatory Visit
Admission: RE | Admit: 2022-04-20 | Discharge: 2022-04-20 | Disposition: A | Discharge status: Home / Self Care | Payer: Medicare (Managed Care) | Attending: Gastroenterology | Admitting: Gastroenterology

## 2022-04-20 ENCOUNTER — Ambulatory Visit: Payer: Medicare (Managed Care) | Admitting: Anesthesiology

## 2022-04-20 ENCOUNTER — Other Ambulatory Visit: Payer: Self-pay

## 2022-04-20 ENCOUNTER — Encounter: Payer: Self-pay | Admitting: Gastroenterology

## 2022-04-20 DIAGNOSIS — B9681 Helicobacter pylori [H. pylori] as the cause of diseases classified elsewhere: Secondary | ICD-10-CM | POA: Insufficient documentation

## 2022-04-20 DIAGNOSIS — E1151 Type 2 diabetes mellitus with diabetic peripheral angiopathy without gangrene: Secondary | ICD-10-CM | POA: Insufficient documentation

## 2022-04-20 DIAGNOSIS — D509 Iron deficiency anemia, unspecified: Secondary | ICD-10-CM | POA: Diagnosis not present

## 2022-04-20 DIAGNOSIS — K2940 Chronic atrophic gastritis without bleeding: Secondary | ICD-10-CM | POA: Diagnosis not present

## 2022-04-20 DIAGNOSIS — K648 Other hemorrhoids: Secondary | ICD-10-CM | POA: Insufficient documentation

## 2022-04-20 DIAGNOSIS — I10 Essential (primary) hypertension: Secondary | ICD-10-CM | POA: Insufficient documentation

## 2022-04-20 DIAGNOSIS — Z8711 Personal history of peptic ulcer disease: Secondary | ICD-10-CM | POA: Diagnosis not present

## 2022-04-20 DIAGNOSIS — R195 Other fecal abnormalities: Secondary | ICD-10-CM | POA: Diagnosis not present

## 2022-04-20 DIAGNOSIS — K626 Ulcer of anus and rectum: Secondary | ICD-10-CM | POA: Diagnosis not present

## 2022-04-20 DIAGNOSIS — E785 Hyperlipidemia, unspecified: Secondary | ICD-10-CM | POA: Insufficient documentation

## 2022-04-20 DIAGNOSIS — K529 Noninfective gastroenteritis and colitis, unspecified: Secondary | ICD-10-CM | POA: Insufficient documentation

## 2022-04-20 DIAGNOSIS — K644 Residual hemorrhoidal skin tags: Secondary | ICD-10-CM | POA: Insufficient documentation

## 2022-04-20 DIAGNOSIS — R1013 Epigastric pain: Secondary | ICD-10-CM | POA: Insufficient documentation

## 2022-04-20 DIAGNOSIS — E039 Hypothyroidism, unspecified: Secondary | ICD-10-CM | POA: Insufficient documentation

## 2022-04-20 DIAGNOSIS — R140 Abdominal distension (gaseous): Secondary | ICD-10-CM

## 2022-04-20 DIAGNOSIS — Z8673 Personal history of transient ischemic attack (TIA), and cerebral infarction without residual deficits: Secondary | ICD-10-CM | POA: Diagnosis not present

## 2022-04-20 DIAGNOSIS — D500 Iron deficiency anemia secondary to blood loss (chronic): Secondary | ICD-10-CM

## 2022-04-20 DIAGNOSIS — E119 Type 2 diabetes mellitus without complications: Secondary | ICD-10-CM

## 2022-04-20 HISTORY — PX: COLONOSCOPY WITH PROPOFOL: SHX5780

## 2022-04-20 HISTORY — PX: ESOPHAGOGASTRODUODENOSCOPY (EGD) WITH PROPOFOL: SHX5813

## 2022-04-20 HISTORY — PX: BIOPSY: SHX5522

## 2022-04-20 LAB — GLUCOSE, CAPILLARY: Glucose-Capillary: 94 mg/dL (ref 70–99)

## 2022-04-20 SURGERY — COLONOSCOPY WITH PROPOFOL
Anesthesia: General | Site: Rectum

## 2022-04-20 MED ORDER — LACTATED RINGERS IV SOLN: INTRAVENOUS | Status: DC

## 2022-04-20 MED ORDER — SODIUM CHLORIDE 0.9 % IV SOLN: INTRAVENOUS | Status: DC

## 2022-04-20 MED ORDER — LIDOCAINE HCL (CARDIAC) PF 100 MG/5ML IV SOSY
PREFILLED_SYRINGE | INTRAVENOUS | Status: DC | PRN
  Administered 2022-04-20: 40 mg via INTRAVENOUS

## 2022-04-20 MED ORDER — STERILE WATER FOR IRRIGATION IR SOLN
Status: DC | PRN
  Administered 2022-04-20 (×2): 1

## 2022-04-20 MED ORDER — OZEMPIC (1 MG/DOSE) 4 MG/3ML ~~LOC~~ SOPN
1.0000 mg | PEN_INJECTOR | SUBCUTANEOUS | 2 refills | Status: DC
Start: 2022-04-20 — End: 2022-06-24

## 2022-04-20 MED ORDER — PROPOFOL 10 MG/ML IV BOLUS
INTRAVENOUS | Status: DC | PRN
  Administered 2022-04-20 (×2): 50 mg via INTRAVENOUS
  Administered 2022-04-20 (×4): 30 mg via INTRAVENOUS
  Administered 2022-04-20: 60 mg via INTRAVENOUS
  Administered 2022-04-20 (×2): 30 mg via INTRAVENOUS
  Administered 2022-04-20: 50 mg via INTRAVENOUS

## 2022-04-20 SURGICAL SUPPLY — 38 items
BALLN DILATOR 10-12 8 (BALLOONS)
BALLN DILATOR 12-15 8 (BALLOONS)
BALLN DILATOR 15-18 8 (BALLOONS)
BALLN DILATOR CRE 0-12 8 (BALLOONS)
BALLN DILATOR ESOPH 8 10 CRE (MISCELLANEOUS) IMPLANT
BALLOON DILATOR 12-15 8 (BALLOONS) IMPLANT
BALLOON DILATOR 15-18 8 (BALLOONS) IMPLANT
BALLOON DILATOR CRE 0-12 8 (BALLOONS) IMPLANT
BLOCK BITE 60FR ADLT L/F GRN (MISCELLANEOUS) ×3 IMPLANT
CLIP HMST 235XBRD CATH ROT (MISCELLANEOUS) IMPLANT
CLIP RESOLUTION 360 11X235 (MISCELLANEOUS)
ELECT REM PT RETURN 9FT ADLT (ELECTROSURGICAL)
ELECTRODE REM PT RTRN 9FT ADLT (ELECTROSURGICAL) IMPLANT
FCP ESCP3.2XJMB 240X2.8X (MISCELLANEOUS) ×2
FORCEPS BIOP RAD 4 LRG CAP 4 (CUTTING FORCEPS) IMPLANT
FORCEPS BIOP RJ4 240 W/NDL (MISCELLANEOUS) ×3
FORCEPS ESCP3.2XJMB 240X2.8X (MISCELLANEOUS) IMPLANT
GOWN CVR UNV OPN BCK APRN NK (MISCELLANEOUS) ×4 IMPLANT
GOWN ISOL THUMB LOOP REG UNIV (MISCELLANEOUS) ×6
INJECTOR VARIJECT VIN23 (MISCELLANEOUS) IMPLANT
KIT DEFENDO VALVE AND CONN (KITS) IMPLANT
KIT PRC NS LF DISP ENDO (KITS) ×2 IMPLANT
KIT PROCEDURE OLYMPUS (KITS) ×3
MANIFOLD NEPTUNE II (INSTRUMENTS) ×3 IMPLANT
MARKER SPOT ENDO TATTOO 5ML (MISCELLANEOUS) IMPLANT
PROBE APC STR FIRE (PROBE) IMPLANT
RETRIEVER NET PLAT FOOD (MISCELLANEOUS) IMPLANT
RETRIEVER NET ROTH 2.5X230 LF (MISCELLANEOUS) IMPLANT
SNARE COLD EXACTO (MISCELLANEOUS) IMPLANT
SNARE SHORT THROW 13M SML OVAL (MISCELLANEOUS) IMPLANT
SNARE SHORT THROW 30M LRG OVAL (MISCELLANEOUS) IMPLANT
SNARE SNG USE RND 15MM (INSTRUMENTS) IMPLANT
SPOT EX ENDOSCOPIC TATTOO (MISCELLANEOUS)
SYR INFLATION 60ML (SYRINGE) IMPLANT
TRAP ETRAP POLY (MISCELLANEOUS) IMPLANT
VARIJECT INJECTOR VIN23 (MISCELLANEOUS)
WATER STERILE IRR 250ML POUR (IV SOLUTION) ×3 IMPLANT
WIRE CRE 18-20MM 8CM F G (MISCELLANEOUS) IMPLANT

## 2022-04-20 NOTE — H&P (Signed)
[name], MD [address]  [address]  [place], [place] [address]  Main: [phone]  Fax: [phone] Pager: [phone]  Primary Care Physician:  [name], MD Primary Gastroenterologist:  Dr. [name]  Pre-Procedure History & Physical: HPI:  Shane Preston is a 61 y.o. male is here for an endoscopy and colonoscopy.   Past Medical History:  Diagnosis Date   Anemia    Coronary artery disease    Diabetes mellitus without complication (HCC)    Hemorrhoids    Hyperlipidemia    Hypertension    Lumbar stenosis    Neuropathy    Peripheral vascular disease (HCC)    TIA (transient ischemic attack)    Tinea pedis     Past Surgical History:  Procedure Laterality Date   CARDIAC CATHETERIZATION     [facility]    COLONOSCOPY WITH PROPOFOL N/A 10/30/2021   Procedure: COLONOSCOPY WITH PROPOFOL;  Surgeon: Toney Reil, MD;  Location: [facility] ENDOSCOPY;  Service: Gastroenterology;  Laterality: N/A;   EYE SURGERY     HERNIA REPAIR  10/11/2005   lumbar decompression surgery  10/11/2008    Prior to Admission medications   Medication Sig Start Date End Date Taking? Authorizing Provider  carvedilol (COREG) 25 MG tablet Take 1 tablet (25 mg total) by mouth 2 (two) times daily. 03/15/22  Yes Mecum, Erin E, PA-C  ferrous sulfate 324 (65 Fe) MG TBEC Take 325 mg by mouth in the morning and at bedtime. 02/11/22  Yes Vigg, Avanti, MD  gabapentin (NEURONTIN) 300 MG capsule Take 1 capsule (300 mg total) by mouth 3 (three) times daily. 03/15/22 05/14/22 Yes Mecum, Erin E, PA-C  glucose blood test strip Use as instructed 12/28/21  Yes Vigg, Avanti, MD  JARDIANCE 25 MG TABS tablet Take 1 tablet (25 mg total) by mouth daily. 03/15/22  Yes Mecum, Erin E, PA-C  levothyroxine (SYNTHROID) 25 MCG tablet Take 1 tablet (25 mcg total) by mouth every morning. 10/16/21  Yes Johnson, Megan P, DO  lisinopril-hydrochlorothiazide (ZESTORETIC) 20-12.5 MG tablet Take 1 tablet by mouth daily.  03/15/22  Yes Mecum, Erin E, PA-C  Magnesium Oxide -Mg Supplement 250 MG TABS Take 1 tablet by mouth daily. 01/28/21  Yes [provider]  metFORMIN (GLUCOPHAGE) 1000 MG tablet Take 1 tablet (1,000 mg total) by mouth 2 (two) times daily. 03/15/22  Yes Mecum, Erin E, PA-C  nitroGLYCERIN (NITROSTAT) 0.4 MG SL tablet Place 0.4 mg under the tongue every 5 (five) minutes as needed for chest pain.   Yes [provider]  rosuvastatin (CRESTOR) 20 MG tablet Take 1 tablet (20 mg total) by mouth daily. 12/30/21  Yes Vigg, Avanti, MD  Semaglutide, 1 MG/DOSE, (OZEMPIC, 1 MG/DOSE,) 4 MG/3ML SOPN Inject 1 mg into the muscle once a week. 04/20/22  Yes [name], NP  Vitamin D, Ergocalciferol, (DRISDOL) 1.25 MG (50000 UNIT) CAPS capsule TAKE 1 CAPSULE 1 TIME A WEEK 03/16/22  Yes Vigg, Avanti, MD    Allergies as of 04/06/2022   (No Known Allergies)    Family History  Family history unknown: Yes    Social History   Socioeconomic History   Marital status: Married    Spouse name: Not on file   Number of children: Not on file   Years of education: Not on file   Highest education level: Not on file  Occupational History   Not on file  Tobacco Use   Smoking status: Never   Smokeless tobacco: Never  Vaping Use   Vaping Use: Never used  Substance and Sexual Activity   Alcohol use: Not Currently   Drug use: Not Currently   Sexual activity: Not Currently  Other Topics Concern   Not on file  Social History Narrative   ** Merged History Encounter **       Social Determinants of Health   Financial Resource Strain: Low Risk  (09/01/2021)   Overall Financial Resource Strain (CARDIA)    Difficulty of Paying Living Expenses: Not hard at all  Food Insecurity: No Food Insecurity (09/01/2021)   Hunger Vital Sign    Worried About Running Out of Food in the Last Year: Never true    Ran Out of Food in the Last Year: Never true  Transportation Needs: No Transportation Needs (09/01/2021)    PRAPARE - Administrator, Civil Service (Medical): No    Lack of Transportation (Non-Medical): No  Physical Activity: Inactive (09/01/2021)   Exercise Vital Sign    Days of Exercise per Week: 0 days    Minutes of Exercise per Session: 0 min  Stress: No Stress Concern Present (09/01/2021)   [employer] of Occupational Health - Occupational Stress Questionnaire    Feeling of Stress : Not at all  Social Connections: Moderately Isolated (09/01/2021)   Social Connection and Isolation Panel [NHANES]    Frequency of Communication with Friends and Family: More than three times a week    Frequency of Social Gatherings with Friends and Family: More than three times a week    Attends Religious Services: Never    Database administrator or Organizations: No    Attends Banker Meetings: Never    Marital Status: Married  Catering manager Violence: Not At Risk (09/01/2021)   Humiliation, Afraid, Rape, and Kick questionnaire    Fear of Current or Ex-Partner: No    Emotionally Abused: No    Physically Abused: No    Sexually Abused: No    Review of Systems: See HPI, otherwise negative ROS  Physical Exam: BP (!) 145/82   Pulse 63   Temp 97.7 F (36.5 C) (Temporal)   Resp 18   Ht 5\' 1"  (1.549 m)   Wt 88.3 kg   SpO2 98%   BMI 36.77 kg/m  General:   Alert,  pleasant and cooperative in NAD Head:  Normocephalic and atraumatic. Neck:  Supple; no masses or thyromegaly. Lungs:  Clear throughout to auscultation.    Heart:  Regular rate and rhythm. Abdomen:  Soft, nontender and nondistended. Normal bowel sounds, without guarding, and without rebound.   Neurologic:  Alert and  oriented x4;  grossly normal neurologically.  Impression/Plan: is here for an endoscopy and colonoscopy to be performed for chronic iron deficiency anemia, abdominal bloating, loose stools  Risks, benefits, limitations, and alternatives regarding  endoscopy and  colonoscopy have been reviewed with the patient.  Questions have been answered.  All parties agreeable.   Exie Parody, MD  04/20/2022, 10:25 AM

## 2022-04-20 NOTE — Op Note (Signed)
[facility] Gastroenterology Patient Name: Shane Preston [facility] Procedure Date: 04/20/2022 10:58 AM MRN: [id number] Account #: [id number] Date of Birth: 02-Apr-1961 Admit Type: Outpatient Age: 62 Room: [facility] OR ROOM 01 Gender: Male Note Status: Finalized Instrument Name: [id number] Procedure:             Upper GI endoscopy Indications:           Dyspepsia, Diarrhea Providers:             Toney Reil MD, MD Referring MD:          [name] (Referring MD) Medicines:             General Anesthesia Complications:         No immediate complications. Estimated blood loss: None. Procedure:             Pre-Anesthesia Assessment:                        - Prior to the procedure, a History and Physical was                         performed, and patient medications and allergies were                         reviewed. The patient is competent. The risks and                         benefits of the procedure and the sedation options and                         risks were discussed with the patient. All questions                         were answered and informed consent was obtained.                         Patient identification and proposed procedure were                         verified by the physician, the nurse, the                         anesthesiologist, the anesthetist and the technician                         in the pre-procedure area in the procedure room in the                         endoscopy suite. Mental Status Examination: alert and                         oriented. Airway Examination: normal oropharyngeal                         airway and neck mobility. Respiratory Examination:                         clear to auscultation. CV Examination: normal.  Prophylactic Antibiotics: The patient does not require                         prophylactic antibiotics. Prior Anticoagulants: The                         patient has taken no  previous anticoagulant or                         antiplatelet agents. ASA Grade Assessment: III - A                         patient with severe systemic disease. After reviewing                         the risks and benefits, the patient was deemed in                         satisfactory condition to undergo the procedure. The                         anesthesia plan was to use general anesthesia.                         Immediately prior to administration of medications,                         the patient was re-assessed for adequacy to receive                         sedatives. The heart rate, respiratory rate, oxygen                         saturations, blood pressure, adequacy of pulmonary                         ventilation, and response to care were monitored                         throughout the procedure. The physical status of the                         patient was re-assessed after the procedure.                        After obtaining informed consent, the endoscope was                         passed under direct vision. Throughout the procedure,                         the patient's blood pressure, pulse, and oxygen                         saturations were monitored continuously. The Endoscope                         was introduced through the mouth, and advanced to the  second part of duodenum. The upper GI endoscopy was                         accomplished without difficulty. The patient tolerated                         the procedure well. Findings:      The duodenal bulb and second portion of the duodenum were normal.       Biopsies were taken with a cold forceps for histology.      Diffuse mildly erythematous mucosa without bleeding was found in the       gastric fundus, in the gastric body and in the gastric antrum. Biopsies       were taken with a cold forceps for Helicobacter pylori testing.      The cardia and gastric fundus were normal on  retroflexion.      Esophagogastric landmarks were identified: the gastroesophageal junction       was found at 40 cm from the incisors.      The gastroesophageal junction and examined esophagus were normal. Impression:            - Normal duodenal bulb and second portion of the                         duodenum. Biopsied.                        - Erythematous mucosa in the gastric fundus, gastric                         body and antrum. Biopsied.                        - Esophagogastric landmarks identified.                        - Normal gastroesophageal junction and esophagus. Recommendation:        - Await pathology results.                        - Proceed with colonoscopy as scheduled                        See colonoscopy report Procedure Code(s):     --- Professional ---                        [phone], Esophagogastroduodenoscopy, flexible,                         transoral; with biopsy, single or multiple Diagnosis Code(s):     --- Professional ---                        K31.89, Other diseases of stomach and duodenum                        R10.13, Epigastric pain                        R19.7, Diarrhea, unspecified CPT copyright 2019 American Medical Association. All rights reserved. The codes documented in this  report are preliminary and upon coder review may  be revised to meet current compliance requirements. Dr. Libby Maw Toney Reil MD, MD 04/20/2022 11:14:21 AM This report has been signed electronically. Number of Addenda: 0 Note Initiated On: 04/20/2022 10:58 AM Total Procedure Duration: 0 hours 5 minutes 56 seconds  Estimated Blood Loss:  Estimated blood loss: none.      [facility]

## 2022-04-20 NOTE — Anesthesia Postprocedure Evaluation (Signed)
Anesthesia Post Note  Patient: Shane Preston  Procedure(s) Performed: COLONOSCOPY WITH PROPOFOL (Rectum) ESOPHAGOGASTRODUODENOSCOPY (EGD) WITH PROPOFOL (Esophagus) BIOPSY (Rectum)     Patient location during evaluation: PACU Anesthesia Type: General Level of consciousness: awake and alert Pain management: pain level controlled Vital Signs Assessment: post-procedure vital signs reviewed and stable Respiratory status: spontaneous breathing, nonlabored ventilation, respiratory function stable and patient connected to nasal cannula oxygen Cardiovascular status: blood pressure returned to baseline and stable Postop Assessment: no apparent nausea or vomiting Anesthetic complications: no   No notable events documented.  Taten Merrow A  Hildreth Robart

## 2022-04-20 NOTE — Transfer of Care (Signed)
Immediate Anesthesia Transfer of Care Note  Patient: Shane Preston  Procedure(s) Performed: COLONOSCOPY WITH PROPOFOL (Rectum) ESOPHAGOGASTRODUODENOSCOPY (EGD) WITH PROPOFOL (Esophagus) BIOPSY (Rectum)  Patient Location: PACU  Anesthesia Type: General  Level of Consciousness: awake, alert  and patient cooperative  Airway and Oxygen Therapy: Patient Spontanous Breathing and Patient connected to supplemental oxygen  Post-op Assessment: Post-op Vital signs reviewed, Patient's Cardiovascular Status Stable, Respiratory Function Stable, Patent Airway and No signs of Nausea or vomiting  Post-op Vital Signs: Reviewed and stable  Complications: No notable events documented.

## 2022-04-20 NOTE — Op Note (Signed)
[facility] Gastroenterology Patient Name: Shane Preston [facility] Procedure Date: 04/20/2022 10:57 AM MRN: [id number] Account #: [id number] Date of Birth: August 08, 1961 Admit Type: Outpatient Age: 61 Room: [facility] OR ROOM 01 Gender: Male Note Status: Finalized Instrument Name: [id number] Procedure:             Colonoscopy Indications:           Chronic diarrhea Providers:             Toney Reil MD, MD Referring MD:          [name] (Referring MD) Medicines:             General Anesthesia Complications:         No immediate complications. Estimated blood loss: None. Procedure:             Pre-Anesthesia Assessment:                        - Prior to the procedure, a History and Physical was                         performed, and patient medications and allergies were                         reviewed. The patient is competent. The risks and                         benefits of the procedure and the sedation options and                         risks were discussed with the patient. All questions                         were answered and informed consent was obtained.                         Patient identification and proposed procedure were                         verified by the physician, the nurse, the                         anesthesiologist, the anesthetist and the technician                         in the pre-procedure area in the procedure room in the                         endoscopy suite. Mental Status Examination: alert and                         oriented. Airway Examination: normal oropharyngeal                         airway and neck mobility. Respiratory Examination:                         clear to auscultation. CV Examination: normal.  Prophylactic Antibiotics: The patient does not require                         prophylactic antibiotics. Prior Anticoagulants: The                         patient has taken no previous  anticoagulant or                         antiplatelet agents. ASA Grade Assessment: III - A                         patient with severe systemic disease. After reviewing                         the risks and benefits, the patient was deemed in                         satisfactory condition to undergo the procedure. The                         anesthesia plan was to use general anesthesia.                         Immediately prior to administration of medications,                         the patient was re-assessed for adequacy to receive                         sedatives. The heart rate, respiratory rate, oxygen                         saturations, blood pressure, adequacy of pulmonary                         ventilation, and response to care were monitored                         throughout the procedure. The physical status of the                         patient was re-assessed after the procedure.                        After obtaining informed consent, the colonoscope was                         passed under direct vision. Throughout the procedure,                         the patient's blood pressure, pulse, and oxygen                         saturations were monitored continuously. The                         Colonoscope was introduced through the anus and  advanced to the the terminal ileum, with                         identification of the appendiceal orifice and IC                         valve. The colonoscopy was performed without                         difficulty. The patient tolerated the procedure well.                         The quality of the bowel preparation was evaluated                         using the BBPS [facility] Bowel Preparation Scale) with                         scores of: Right Colon = 3, Transverse Colon = 3 and                         Left Colon = 3 (entire mucosa seen well with no                         residual staining, small  fragments of stool or opaque                         liquid). The total BBPS score equals 9. Findings:      The perianal and digital rectal examinations were normal. Pertinent       negatives include normal sphincter tone and no palpable rectal lesions.      The terminal ileum appeared normal.      Non-bleeding external and internal hemorrhoids were found during       retroflexion. The hemorrhoids were medium-sized.      A single (solitary) 10 mm post banding ulcer was found in the distal       rectum. No bleeding was present. No stigmata of recent bleeding were       seen.      Normal mucosa was found in the entire colon. Biopsies for histology were       taken with a cold forceps from the entire colon for evaluation of       microscopic colitis. Impression:            - The examined portion of the ileum was normal.                        - Non-bleeding external and internal hemorrhoids.                        - A single (solitary) ulcer in the distal rectum.                        - No specimens collected. Recommendation:        - Discharge patient to home (with escort).                        - Resume previous diet today.                        -  Continue present medications.                        - Await pathology results.                        - Return to my office as previously scheduled. Procedure Code(s):     --- Professional ---                        [id number], Colonoscopy, flexible; with biopsy, single or                         multiple Diagnosis Code(s):     --- Professional ---                        K64.8, Other hemorrhoids                        K62.6, Ulcer of anus and rectum                        K52.9, Noninfective gastroenteritis and colitis,                         unspecified CPT copyright 2019 American Medical Association. All rights reserved. The codes documented in this report are preliminary and upon coder review may  be revised to meet current compliance  requirements. Dr. Libby Maw Toney Reil MD, MD 04/20/2022 11:38:59 AM This report has been signed electronically. Number of Addenda: 0 Note Initiated On: 04/20/2022 10:57 AM Scope Withdrawal Time: 0 hours 12 minutes 38 seconds  Total Procedure Duration: 0 hours 19 minutes 55 seconds  Estimated Blood Loss:  Estimated blood loss: none. Estimated blood loss: none.      [facility]

## 2022-04-20 NOTE — Anesthesia Preprocedure Evaluation (Signed)
Anesthesia Evaluation  Patient identified by MRN, date of birth, ID band Patient awake    Reviewed: Allergy & Precautions, NPO status , Patient's Chart, lab work & pertinent test results, reviewed documented beta blocker date and time   History of Anesthesia Complications Negative for: history of anesthetic complications  Airway Mallampati: III  TM Distance: >3 FB Neck ROM: Full    Dental   Pulmonary    breath sounds clear to auscultation       Cardiovascular hypertension, (-) angina+ Peripheral Vascular Disease  (-) DOE  Rhythm:Regular Rate:Normal   HLD   Neuro/Psych TIA Neuromuscular disease (Lumbar stenosis)    GI/Hepatic PUD, neg GERD  ,  Endo/Other  diabetes, Type 2Hypothyroidism   Renal/GU      Musculoskeletal   Abdominal (+) + obese (BMI 37),   Peds  Hematology  (+) Blood dyscrasia, anemia ,   Anesthesia Other Findings   Reproductive/Obstetrics                             Anesthesia Physical Anesthesia Plan  ASA: 3  Anesthesia Plan: General   Post-op Pain Management:    Induction: Intravenous  PONV Risk Score and Plan: 2 and Propofol infusion, TIVA and Treatment may vary due to age or medical condition  Airway Management Planned: Natural Airway and Nasal Cannula  Additional Equipment:   Intra-op Plan:   Post-operative Plan:   Informed Consent: I have reviewed the patients History and Physical, chart, labs and discussed the procedure including the risks, benefits and alternatives for the proposed anesthesia with the patient or authorized representative who has indicated his/her understanding and acceptance.       Plan Discussed with: CRNA and Anesthesiologist  Anesthesia Plan Comments:         Anesthesia Quick Evaluation

## 2022-04-21 ENCOUNTER — Encounter: Payer: Self-pay | Admitting: Gastroenterology

## 2022-04-22 LAB — SURGICAL PATHOLOGY

## 2022-04-23 ENCOUNTER — Telehealth: Payer: Self-pay

## 2022-04-23 DIAGNOSIS — A048 Other specified bacterial intestinal infections: Secondary | ICD-10-CM

## 2022-04-23 MED ORDER — OMEPRAZOLE 20 MG PO CPDR: 20.0000 mg | DELAYED_RELEASE_CAPSULE | Freq: Two times a day (BID) | ORAL | 0 refills | Status: DC

## 2022-04-23 MED ORDER — CLARITHROMYCIN 500 MG PO TABS: 500.0000 mg | ORAL_TABLET | Freq: Two times a day (BID) | ORAL | 0 refills | Status: AC

## 2022-04-23 MED ORDER — AMOXICILLIN 500 MG PO TABS: 1000.0000 mg | ORAL_TABLET | Freq: Two times a day (BID) | ORAL | 0 refills | Status: DC

## 2022-04-23 NOTE — Telephone Encounter (Signed)
-----   Message from Shane Reil, MD sent at 04/23/2022  8:22 AM EDT ----- Pathology results from recent upper endoscopy confirm Helicobacter pylori infection which explains his symptoms.  Plz send in the prescription for triple therapy to treat H Pylori for 14days  Omeprazole 20mg  BID Clarithromycin 500mg  BID Amoxicillin 1gm BID  Order H Pylori breath test in 4weeks after completing medication to confirm eradication.  Patient should be off prilosec and H2 blocker atleast for 2weeks before the test  Thanks RV

## 2022-04-23 NOTE — Telephone Encounter (Signed)
Called Interpreter service and the  ID number is [id number]. Patient verbalized understanding of results and sent medication to the pharmacy

## 2022-05-03 ENCOUNTER — Ambulatory Visit (INDEPENDENT_AMBULATORY_CARE_PROVIDER_SITE_OTHER): Payer: Medicare (Managed Care) | Admitting: Gastroenterology

## 2022-05-03 ENCOUNTER — Encounter: Payer: Self-pay | Admitting: Gastroenterology

## 2022-05-03 VITALS — BP 108/67 | HR 97 | Temp 98.7°F | Ht 61.02 in | Wt 194.0 lb

## 2022-05-03 DIAGNOSIS — K640 First degree hemorrhoids: Secondary | ICD-10-CM | POA: Diagnosis not present

## 2022-05-03 DIAGNOSIS — K625 Hemorrhage of anus and rectum: Secondary | ICD-10-CM | POA: Diagnosis not present

## 2022-05-03 NOTE — Progress Notes (Signed)

## 2022-05-07 ENCOUNTER — Encounter: Payer: Self-pay | Admitting: Family

## 2022-05-12 ENCOUNTER — Encounter: Payer: Self-pay | Admitting: Oncology

## 2022-05-19 ENCOUNTER — Encounter: Payer: Self-pay | Admitting: Oncology

## 2022-05-19 ENCOUNTER — Encounter: Payer: Self-pay | Admitting: Gastroenterology

## 2022-05-19 ENCOUNTER — Ambulatory Visit: Payer: Medicare HMO | Admitting: Gastroenterology

## 2022-05-19 VITALS — BP 106/67 | HR 67 | Temp 98.1°F | Ht 61.02 in | Wt 199.0 lb

## 2022-05-19 DIAGNOSIS — K640 First degree hemorrhoids: Secondary | ICD-10-CM

## 2022-05-19 DIAGNOSIS — K625 Hemorrhage of anus and rectum: Secondary | ICD-10-CM

## 2022-05-19 NOTE — Progress Notes (Signed)
PROCEDURE NOTE: ?The patient presents with symptomatic grade 1 hemorrhoids, unresponsive to maximal medical therapy, requesting rubber band ligation of his/her hemorrhoidal disease.  All risks, benefits and alternative forms of therapy were described and informed consent was obtained. ? ? ?The decision was made to band the LL internal hemorrhoid, and the [facility] O?Regan System was used to perform band ligation without complication.  Digital anorectal examination was then performed to assure proper positioning of the band, and to adjust the banded tissue as required.  The patient was discharged home without pain or other issues.  Dietary and behavioral recommendations were given and (if necessary - prescriptions were given), along with follow-up instructions.  The patient will return 3 weeks for follow-up and possible additional banding as required. ? ?No complications were encountered and the patient tolerated the procedure well. ? ? ?

## 2022-05-31 ENCOUNTER — Other Ambulatory Visit: Payer: Self-pay | Admitting: Nurse Practitioner

## 2022-06-01 NOTE — Telephone Encounter (Signed)
Requested medication (s) are due for refill today: na   Requested medication (s) are on the active medication list: yes test strips no lancets   Last refill:  12/28/21 #100 each 12 refills   Future visit scheduled: yes in 2 weeks  Notes to clinic:  need order for accu check aviva test strips and lancets . Last order for test strips by Dr. Charlotta Newton.      Requested Prescriptions  Pending Prescriptions Disp Refills   ACCU-CHEK AVIVA PLUS test strip [Pharmacy Med Name: ACCU-CHEK AVIVA PLUS TEST STRP] 100 strip 0    Sig: USE 1 THREE TIMES A DAY     Endocrinology: Diabetes - Testing Supplies Passed - 05/31/2022 10:25 AM      Passed - Valid encounter within last 12 months    Recent Outpatient Visits           2 months ago Primary hypertension   [name] Mecum, [name], PA-C   5 months ago Primary hypertension   [name] Vigg, Avanti, MD   6 months ago Anemia, unspecified type   [facility], Avanti, MD   7 months ago Anemia, unspecified type   [facility] Vigg, Avanti, MD   7 months ago BRBPR (bright red blood per rectum)   [name] Vigg, Avanti, MD       Future Appointments             In 1 week Vanga, Loel Dubonnet, MD [facility] [place]   In 2 weeks Lori Cirri, NP [name], [facility]             Accu-Chek Softclix Lancets lancets [employer] Med Name: ACCU-CHEK SOFTCLIX LANCETS] 100 each 0    Sig: USE 1 LANCET THREE TIMES A DAY     Endocrinology: Diabetes - Testing Supplies Passed - 05/31/2022 10:25 AM      Passed - Valid encounter within last 12 months    Recent Outpatient Visits           2 months ago Primary hypertension   [name] Mecum, [name], PA-C   5 months ago Primary hypertension   [name] Vigg, Avanti, MD   6 months ago Anemia, unspecified type   [facility] Vigg, Avanti, MD   7 months ago Anemia, unspecified type    [facility] Vigg, Avanti, MD   7 months ago BRBPR (bright red blood per rectum)   [name] Vigg, Avanti, MD       Future Appointments             In 1 week Vanga, Loel Dubonnet, MD [place] [facility] [place]   In 2 weeks Dantonio Cirri, NP [facility], [facility]

## 2022-06-10 ENCOUNTER — Encounter: Payer: Self-pay | Admitting: Oncology

## 2022-06-10 ENCOUNTER — Encounter: Payer: Self-pay | Admitting: Gastroenterology

## 2022-06-10 ENCOUNTER — Ambulatory Visit: Payer: Medicare HMO | Admitting: Gastroenterology

## 2022-06-10 VITALS — BP 103/66 | HR 63 | Temp 98.1°F | Ht 61.02 in | Wt 197.2 lb

## 2022-06-10 DIAGNOSIS — K640 First degree hemorrhoids: Secondary | ICD-10-CM | POA: Diagnosis not present

## 2022-06-10 NOTE — Progress Notes (Signed)
PROCEDURE NOTE: ?The patient presents with symptomatic grade 1 hemorrhoids, unresponsive to maximal medical therapy, requesting rubber band ligation of his/her hemorrhoidal disease.  All risks, benefits and alternative forms of therapy were described and informed consent was obtained. ? ?The decision was made to band the RA internal hemorrhoid, and the [facility] O?Regan System was used to perform band ligation without complication.  Digital anorectal examination was then performed to assure proper positioning of the band, and to adjust the banded tissue as required.  The patient was discharged home without pain or other issues.  Dietary and behavioral recommendations were given and (if necessary - prescriptions were given), along with follow-up instructions.  The patient will return  as needed for follow-up and possible additional banding as required. ? ?No complications were encountered and the patient tolerated the procedure well. ? ? ?

## 2022-06-15 ENCOUNTER — Ambulatory Visit: Payer: Medicare (Managed Care) | Admitting: Unknown Physician Specialty

## 2022-06-17 ENCOUNTER — Ambulatory Visit: Payer: Medicare HMO | Admitting: Unknown Physician Specialty

## 2022-06-24 ENCOUNTER — Ambulatory Visit (INDEPENDENT_AMBULATORY_CARE_PROVIDER_SITE_OTHER): Payer: Medicare HMO | Admitting: Physician Assistant

## 2022-06-24 ENCOUNTER — Encounter: Payer: Self-pay | Admitting: Physician Assistant

## 2022-06-24 VITALS — BP 104/67 | HR 60 | Temp 99.0°F | Ht 61.02 in | Wt 195.6 lb

## 2022-06-24 DIAGNOSIS — Z8639 Personal history of other endocrine, nutritional and metabolic disease: Secondary | ICD-10-CM

## 2022-06-24 DIAGNOSIS — Z23 Encounter for immunization: Secondary | ICD-10-CM

## 2022-06-24 DIAGNOSIS — D509 Iron deficiency anemia, unspecified: Secondary | ICD-10-CM

## 2022-06-24 DIAGNOSIS — R5383 Other fatigue: Secondary | ICD-10-CM | POA: Diagnosis not present

## 2022-06-24 DIAGNOSIS — E119 Type 2 diabetes mellitus without complications: Secondary | ICD-10-CM

## 2022-06-24 DIAGNOSIS — I10 Essential (primary) hypertension: Secondary | ICD-10-CM | POA: Diagnosis not present

## 2022-06-24 MED ORDER — OMEPRAZOLE 20 MG PO CPDR
20.0000 mg | DELAYED_RELEASE_CAPSULE | Freq: Two times a day (BID) | ORAL | 0 refills | Status: DC
Start: 2022-06-24 — End: 2023-03-24

## 2022-06-24 MED ORDER — OZEMPIC (1 MG/DOSE) 4 MG/3ML ~~LOC~~ SOPN: 1.0000 mg | PEN_INJECTOR | SUBCUTANEOUS | 2 refills | Status: DC

## 2022-06-24 MED ORDER — JARDIANCE 25 MG PO TABS: 25.0000 mg | ORAL_TABLET | Freq: Every day | ORAL | 1 refills | Status: DC

## 2022-06-24 MED ORDER — CARVEDILOL 25 MG PO TABS: 25.0000 mg | ORAL_TABLET | Freq: Two times a day (BID) | ORAL | 1 refills | Status: DC

## 2022-06-24 MED ORDER — FERROUS SULFATE 324 (65 FE) MG PO TBEC: DELAYED_RELEASE_TABLET | ORAL | 2 refills | Status: DC

## 2022-06-24 MED ORDER — LISINOPRIL-HYDROCHLOROTHIAZIDE 20-12.5 MG PO TABS: 1.0000 | ORAL_TABLET | Freq: Every day | ORAL | 1 refills | Status: DC

## 2022-06-24 MED ORDER — VITAMIN D (ERGOCALCIFEROL) 1.25 MG (50000 UNIT) PO CAPS: ORAL_CAPSULE | ORAL | 0 refills | Status: DC

## 2022-06-24 MED ORDER — METFORMIN HCL 1000 MG PO TABS: 1000.0000 mg | ORAL_TABLET | Freq: Two times a day (BID) | ORAL | 0 refills | Status: DC

## 2022-06-24 NOTE — Patient Instructions (Addendum)
Please make an apt with your Cardiologist to discuss the chest pains you are having  Your Cardiologist is Dr. Lorine Bears so please call his office to schedule a follow up to talk about your heart

## 2022-06-24 NOTE — Assessment & Plan Note (Signed)
Chronic, historic condition.  Currently taking Jardiance 25 mg PO QD, Metformin 1000 mg PO BID and Semaglutide 1 mg/ dose IM once per week Previous A1c was 6.2 in March 2023- will recheck today Results to dictate further management Continue current regimen pending results Follow up in 3 months for monitoring

## 2022-06-24 NOTE — Assessment & Plan Note (Signed)
Historic condition, unsure if resolved as patient was recently in hospital for GI bleed Will recheck CBC, iron panel today for monitoring Will refill iron supplements as well Results to dictate further management Follow up as indicated by labs

## 2022-06-24 NOTE — Progress Notes (Signed)
Established Patient Office Visit  Name: Shane Preston   MRN: [id number]    DOB: 25-Apr-1961   Date:06/24/2022  Today's Provider: Jacquelin Hawking, MHS, PA-C Introduced myself to the patient as a PA-C and provided education on APPs in clinical practice.         Subjective  Chief Complaint  Chief Complaint  Patient presents with   Hypertension   Diabetes    Hypertension Associated symptoms include chest pain (states this has improved). Pertinent negatives include no blurred vision, headaches, malaise/fatigue, palpitations or shortness of breath.  Diabetes Pertinent negatives for hypoglycemia include no dizziness or headaches. Associated symptoms include chest pain (states this has improved). Pertinent negatives for diabetes include no blurred vision.    Diabetes, Type 2 - Last A1c 6.2 on 12/30/21 - Medications: Jardiance 25 mg PO QD, Metformin 1000 mg po BID , Semaglutide 1 mg per week IM,  - Compliance: Excellent  - Checking BG at home: Checking 3 x day - normally running in 150s- 160s  - Diet: Normal diet - Exercise: Walking everyday 20-30 minutes each time  - Eye exam: Has had diabetic eye exam at [facility] eye  - Statin: on statin - PNA vaccine: NA  - Denies symptoms of hypoglycemia, polyuria, polydipsia, numbness extremities, foot ulcers/trauma  Hypertension: - Medications: Lisinopril- HCTZ 20-12.5 mg PO QD, Carvedilol 25 mg PO QD,  - Compliance: excellent - Checking BP at home: Checking daily at home 105/ 88 avg  - Denies any SOB, vision changes, LE edema, medication SEs, or symptoms of hypotension Reports some intermittent Chest pain - sometimes happens while walking, sometimes happens while sitting Resolves in 15-20 minutes  Has been using Nitroglycerin but has not had to use in several months   Anemia   Patient was in hospital for GI bleed in June Reports he is feeling much better.  He is not sure what caused the bleed   Patient Active Problem  List   Diagnosis Date Noted   Grade I hemorrhoids    Iron deficiency anemia 10/28/2021   Rectal bleeding 10/28/2021   Bilateral hearing loss 10/28/2021   Back pain 05/14/2021   Diabetes mellitus without complication (HCC) 05/14/2021   Hypothyroidism 05/14/2021   Acquired hypothyroidism 01/15/2019   Mixed hyperlipidemia 02/24/2016   Peripheral sensory neuropathy due to type 2 diabetes mellitus (HCC) 02/24/2016   Chest pain- hx of Nl cors 2013, negative Myoview 4/15 02/06/2014   HTN (hypertension) 02/06/2014   Normal coronary arteries- Feb 2012 02/06/2014   Obesity- BMI 38 02/06/2014    Past Surgical History:  Procedure Laterality Date   BIOPSY N/A 04/20/2022   Procedure: BIOPSY;  Surgeon: Toney Reil, MD;  Location: [facility];  Service: Endoscopy;  Laterality: N/A;   CARDIAC CATHETERIZATION     [facility]    COLONOSCOPY WITH PROPOFOL N/A 10/30/2021   Procedure: COLONOSCOPY WITH PROPOFOL;  Surgeon: Toney Reil, MD;  Location: [facility];  Service: Gastroenterology;  Laterality: N/A;   COLONOSCOPY WITH PROPOFOL N/A 04/20/2022   Procedure: COLONOSCOPY WITH PROPOFOL;  Surgeon: Toney Reil, MD;  Location: [facility];  Service: Endoscopy;  Laterality: N/A;   ESOPHAGOGASTRODUODENOSCOPY (EGD) WITH PROPOFOL N/A 04/20/2022   Procedure: ESOPHAGOGASTRODUODENOSCOPY (EGD) WITH PROPOFOL;  Surgeon: Toney Reil, MD;  Location: [facility];  Service: Endoscopy;  Laterality: N/A;   EYE SURGERY     HERNIA REPAIR  10/11/2005   lumbar decompression surgery  10/11/2008  Family History  Family history unknown: Yes    Social History   Tobacco Use   Smoking status: Never   Smokeless tobacco: Never  Substance Use Topics   Alcohol use: Not Currently     Current Outpatient Medications:    ACCU-CHEK AVIVA PLUS test strip, USE 1 THREE TIMES A DAY, Disp: 100 strip, Rfl: 0   Accu-Chek Softclix Lancets lancets, USE 1 LANCET THREE TIMES A  DAY, Disp: 100 each, Rfl: 0   levothyroxine (SYNTHROID) 25 MCG tablet, Take 1 tablet (25 mcg total) by mouth every morning., Disp: 90 tablet, Rfl: 3   Magnesium Oxide -Mg Supplement 250 MG TABS, Take 1 tablet by mouth daily., Disp: , Rfl:    nitroGLYCERIN (NITROSTAT) 0.4 MG SL tablet, Place 0.4 mg under the tongue every 5 (five) minutes as needed for chest pain., Disp: , Rfl:    rosuvastatin (CRESTOR) 20 MG tablet, Take 1 tablet (20 mg total) by mouth daily., Disp: 30 tablet, Rfl: 6   carvedilol (COREG) 25 MG tablet, Take 1 tablet (25 mg total) by mouth 2 (two) times daily., Disp: 180 tablet, Rfl: 1   ferrous sulfate 324 (65 Fe) MG TBEC, Take 325 mg by mouth in the morning and at bedtime., Disp: 60 tablet, Rfl: 2   gabapentin (NEURONTIN) 300 MG capsule, Take 1 capsule (300 mg total) by mouth 3 (three) times daily., Disp: 90 capsule, Rfl: 1   JARDIANCE 25 MG TABS tablet, Take 1 tablet (25 mg total) by mouth daily., Disp: 90 tablet, Rfl: 1   lisinopril-hydrochlorothiazide (ZESTORETIC) 20-12.5 MG tablet, Take 1 tablet by mouth daily., Disp: 90 tablet, Rfl: 1   metFORMIN (GLUCOPHAGE) 1000 MG tablet, Take 1 tablet (1,000 mg total) by mouth 2 (two) times daily., Disp: 180 tablet, Rfl: 0   omeprazole (PRILOSEC) 20 MG capsule, Take 1 capsule (20 mg total) by mouth 2 (two) times daily before a meal for 14 days., Disp: 28 capsule, Rfl: 0   Semaglutide, 1 MG/DOSE, (OZEMPIC, 1 MG/DOSE,) 4 MG/3ML SOPN, Inject 1 mg into the muscle once a week., Disp: 3 mL, Rfl: 2   Vitamin D, Ergocalciferol, (DRISDOL) 1.25 MG (50000 UNIT) CAPS capsule, TAKE 1 CAPSULE 1 TIME A WEEK, Disp: 12 capsule, Rfl: 0  No Known Allergies  I personally reviewed active problem list, medication list, allergies, health maintenance, notes from last encounter, lab results with the patient/caregiver today.   Review of Systems  Constitutional:  Negative for chills, fever and malaise/fatigue.  HENT:  Negative for hearing loss and tinnitus.    Eyes:  Negative for blurred vision and double vision.  Respiratory:  Negative for shortness of breath and wheezing.   Cardiovascular:  Positive for chest pain (states this has improved). Negative for palpitations and leg swelling.  Gastrointestinal:  Negative for blood in stool, constipation, diarrhea, heartburn, nausea and vomiting.  Musculoskeletal:  Negative for joint pain and myalgias.  Neurological:  Negative for dizziness and headaches.      Objective  Vitals:   06/24/22 1039  BP: 104/67  Pulse: 60  Temp: 99 F (37.2 C)  TempSrc: Oral  SpO2: 96%  Weight: 195 lb 9.6 oz (88.7 kg)  Height: 5' 1.02" (1.55 m)    Body mass index is 36.93 kg/m.  Physical Exam Vitals reviewed.  Constitutional:      General: He is awake.     Appearance: Normal appearance. He is well-developed, well-groomed and overweight.  HENT:     Head: Normocephalic and atraumatic.  Eyes:  General: Lids are normal. Gaze aligned appropriately.     Extraocular Movements: Extraocular movements intact.     Conjunctiva/sclera: Conjunctivae normal.  Cardiovascular:     Rate and Rhythm: Normal rate and regular rhythm.     Pulses:          Radial pulses are 2+ on the right side and 2+ on the left side.       Dorsalis pedis pulses are 2+ on the right side and 2+ on the left side.       Posterior tibial pulses are 0 on the right side and 0 on the left side.     Heart sounds: Normal heart sounds. No murmur heard.    No friction rub. No gallop.  Pulmonary:     Effort: Pulmonary effort is normal.     Breath sounds: Normal breath sounds. No decreased air movement. No decreased breath sounds, wheezing, rhonchi or rales.  Musculoskeletal:     Right lower leg: No edema.     Left lower leg: No edema.  Neurological:     General: No focal deficit present.     Mental Status: He is alert and oriented to person, place, and time.     GCS: GCS eye subscore is 4. GCS verbal subscore is 5. GCS motor subscore is 6.      Cranial Nerves: No dysarthria or facial asymmetry.     Coordination: Coordination is intact. Heel to [facility] Test normal.     Gait: Gait is intact.  Psychiatric:        Attention and Perception: Attention and perception normal.        Mood and Affect: Mood and affect normal.        Speech: Speech normal.        Behavior: Behavior normal. Behavior is cooperative.        Cognition and Memory: Cognition normal.      Recent Results (from the past 2160 hour(s))  Lipase, blood     Status: None   Collection Time: 04/04/22  7:04 AM  Result Value Ref Range   Lipase 43 11 - 51 U/L    Comment: Performed at [facility], [address] [place], [place] [address]  Comprehensive metabolic panel     Status: Abnormal   Collection Time: 04/04/22  7:04 AM  Result Value Ref Range   Sodium 132 (L) 135 - 145 mmol/L   Potassium 3.7 3.5 - 5.1 mmol/L   Chloride 100 98 - 111 mmol/L   CO2 22 22 - 32 mmol/L   Glucose, Bld 138 (H) 70 - 99 mg/dL    Comment: Glucose reference range applies only to samples taken after fasting for at least 8 hours.   BUN 11 8 - 23 mg/dL   Creatinine, Ser 3.55 0.61 - 1.24 mg/dL   Calcium 9.1 8.9 - 73.2 mg/dL   Total Protein 8.2 (H) 6.5 - 8.1 g/dL   Albumin 4.4 3.5 - 5.0 g/dL   AST 17 15 - 41 U/L   ALT 21 0 - 44 U/L   Alkaline Phosphatase 64 38 - 126 U/L   Total Bilirubin 1.1 0.3 - 1.2 mg/dL   GFR, Estimated >20 >25 mL/min    Comment: (NOTE) Calculated using the CKD-EPI Creatinine Equation (2021)    Anion gap 10 5 - 15    Comment: Performed at [facility], [address], [place], [place] [address]  CBC     Status: None   Collection Time:  04/04/22  7:04 AM  Result Value Ref Range   WBC 10.3 4.0 - 10.5 K/uL   RBC 5.12 4.22 - 5.81 MIL/uL   Hemoglobin 14.6 13.0 - 17.0 g/dL   HCT 16.144.8 09.639.0 - 04.552.0 %   MCV 87.5 80.0 - 100.0 fL   MCH 28.5 26.0 - 34.0 pg   MCHC 32.6 30.0 - 36.0 g/dL   RDW 40.913.9 81.111.5 - 91.415.5 %   Platelets 281 150 - 400 K/uL   nRBC 0.0  0.0 - 0.2 %    Comment: Performed at [facility], [address] [place], [place] [address]  Urinalysis, Routine w reflex microscopic     Status: Abnormal   Collection Time: 04/04/22  7:05 AM  Result Value Ref Range   Color, Urine YELLOW (A) YELLOW   APPearance CLEAR (A) CLEAR   Specific Gravity, Urine 1.020 1.005 - 1.030   pH 6.0 5.0 - 8.0   Glucose, UA >=500 (A) NEGATIVE mg/dL   Hgb urine dipstick MODERATE (A) NEGATIVE   Bilirubin Urine NEGATIVE NEGATIVE   Ketones, ur 5 (A) NEGATIVE mg/dL   Protein, ur NEGATIVE NEGATIVE mg/dL   Nitrite NEGATIVE NEGATIVE   Leukocytes,Ua NEGATIVE NEGATIVE   RBC / HPF 0-5 0 - 5 RBC/hpf   WBC, UA 0-5 0 - 5 WBC/hpf   Bacteria, UA NONE SEEN NONE SEEN   Squamous Epithelial / LPF NONE SEEN 0 - 5    Comment: Performed at [facility], [address] [place], [place] [address]  Iron and TIBC     Status: Abnormal   Collection Time: 04/05/22  9:26 AM  Result Value Ref Range   Iron 44 (L) 45 - 182 ug/dL   TIBC 865344 784250 - 696450 ug/dL   Saturation Ratios 13 (L) 17.9 - 39.5 %   UIBC 300 ug/dL    Comment: Performed at [facility], [address] [place], [place] [address]  Ferritin     Status: None   Collection Time: 04/05/22  9:26 AM  Result Value Ref Range   Ferritin 87 24 - 336 ng/mL    Comment: Performed at [facility], [address] [place], [place] [address]  CBC with Differential/Platelet     Status: Abnormal   Collection Time: 04/05/22  9:26 AM  Result Value Ref Range   WBC 5.3 4.0 - 10.5 K/uL   RBC 4.51 4.22 - 5.81 MIL/uL   Hemoglobin 13.2 13.0 - 17.0 g/dL   HCT 40.138.9 (L) 02.739.0 - 25.352.0 %   MCV 86.3 80.0 - 100.0 fL   MCH 29.3 26.0 - 34.0 pg   MCHC 33.9 30.0 - 36.0 g/dL   RDW 66.413.7 40.311.5 - 47.415.5 %   Platelets 276 150 - 400 K/uL   nRBC 0.0 0.0 - 0.2 %   Neutrophils Relative % 61 %   Neutro Abs 3.3 1.7 - 7.7 K/uL   Lymphocytes Relative 24 %   Lymphs Abs 1.3 0.7 - 4.0 K/uL   Monocytes Relative 12 %    Monocytes Absolute 0.6 0.1 - 1.0 K/uL   Eosinophils Relative 2 %   Eosinophils Absolute 0.1 0.0 - 0.5 K/uL   Basophils Relative 1 %   Basophils Absolute 0.0 0.0 - 0.1 K/uL   Immature Granulocytes 0 %   Abs Immature Granulocytes 0.01 0.00 - 0.07 K/uL    Comment: Performed at [facility], [address] [place], [place] [address]  Glucose, capillary     Status: None  Collection Time: 04/20/22 10:08 AM  Result Value Ref Range   Glucose-Capillary 94 70 - 99 mg/dL    Comment: Glucose reference range applies only to samples taken after fasting for at least 8 hours.  Surgical pathology     Status: None   Collection Time: 04/20/22 10:39 AM  Result Value Ref Range   SURGICAL PATHOLOGY      SURGICAL PATHOLOGY CASE: [phone] PATIENT: Exie Parody Surgical Pathology Report     Specimen Submitted: A. Duodenum bx B. Stomach; random C. Colon, random; cbx  Clinical History: Colonoscopy IDA D50.9. EGD abdominal bloating R14.0. Loose stools R19.5. gastric erythema, hemorrhoids.      DIAGNOSIS: A.  DUODENUM; BIOPSIES: - DUODENAL MUCOSA WITH NO SPECIFIC HISTOLOGIC ABNORMALITY. - OVERALL NORMAL ARCHITECTURE AND NORMAL INFLAMMATORY COMPONENT.  B.  STOMACH; BIOPSIES: - SEVERE CHRONIC ACTIVE GASTRITIS WITH MILD GLANDULAR ATROPHY. - ORGANISMS OF HELICOBACTER PYLORI SEEN ON HE SECTIONS. - NO SIGNIFICANT INTESTINAL METAPLASIA, DYSPLASIA OR LYMPHOEPITHELIAL LESIONS.  C.  COLON, RANDOM; BIOPSIES: - COLONIC MUCOSA WITH NO SPECIFIC HISTOLOGIC ABNORMALITY.  GROSS DESCRIPTION: A. Labeled: Duodenal biopsy rule out celiac disease Received: Formalin Collection time: 10:39 AM on 04/20/2022 Placed into formalin time: 10:39 AM on 04/20/2022 Tissue  fragment(s): Multiple Size: Aggregate, 1.1 x 0.5 x 0.2 cm Description: Tan soft tissue fragments Entirely submitted in 1 cassette.  B. Labeled: Random gastric biopsy Received: Formalin Collection time: 11:08 AM on  04/20/2022 Placed into formalin time: 11:08 AM on 04/20/2022 Tissue fragment(s): Multiple Size: Aggregate, 0.8 x 0.5 x 0.2 cm Description: Tan soft tissue fragments Entirely submitted in 1 cassette.  C. Labeled: Random colon biopsies, cold biopsy Received: Formalin Collection time: 11:26 AM on 04/20/2022 Placed into formalin time: 11:26 AM on 04/20/2022 Tissue fragment(s): Multiple Size: Aggregate, 2.2 x 0.4 x 0.2 cm Description: Tan soft tissue fragments Entirely submitted in 1 cassette.  RB 04/21/2022  Final Diagnosis performed by Alcario Drought, MD.   Electronically signed 04/22/2022 3:25:13PM The electronic signature indicates that the named Attending Pathologist has evaluated the specimen Technical component performed at [facility], [address] [place], [place] [address] Lab: [phone] Dir: Jolene Schimke, MD, MMM  Professional component performed at [facility], [address] [place], [place], [place] [address] Lab: [phone] Dir: Beryle Quant, MD      PHQ2/9:    06/24/2022   10:49 AM 03/15/2022   10:19 AM 11/18/2021   10:42 AM 09/01/2021    8:32 AM 07/21/2021   10:22 AM  Depression screen PHQ 2/9  Decreased Interest 0 0 0 0 0  Down, Depressed, Hopeless 0 0 0 0 0  PHQ - 2 Score 0 0 0 0 0  Altered sleeping 0 0 0    Tired, decreased energy 0 0 0    Change in appetite 0 0 0    Feeling bad or failure about yourself  0 0 0    Trouble concentrating 0 0 0    Moving slowly or fidgety/restless 0 0 0    Suicidal thoughts 0 0 0    PHQ-9 Score 0 0 0    Difficult doing work/chores Not difficult at all Not difficult at all Not difficult at all        Fall Risk:    06/24/2022   10:49 AM 03/15/2022   10:19 AM 11/18/2021   10:41 AM 09/01/2021    8:20 AM 07/21/2021   10:22 AM  Fall Risk   Falls in the past year? 0 0 0  0 0  Number falls in past yr: 0 0 0 0 0  Injury with Fall? 0 0 0 0 0  Risk for fall due to : No Fall Risks No Fall Risks No Fall Risks   No Fall Risks  Follow up Falls evaluation completed Falls evaluation completed Falls evaluation completed Falls evaluation completed Falls evaluation completed      Functional Status Survey:      Assessment & Plan  Problem List Items Addressed This Visit       Cardiovascular and Mediastinum   HTN (hypertension) - Primary (Chronic)    Chronic, historic condition, appears stable and well controlled on current medication regimen - Carvedilol 25 mg PO QD, Zestoretic 20-12.5 mg PO QD  He appears to be tolerating this well  BP results at home continue to remain in goal  Continue current regimen  Follow up in 3 months for monitoring       Relevant Medications   carvedilol (COREG) 25 MG tablet   lisinopril-hydrochlorothiazide (ZESTORETIC) 20-12.5 MG tablet     Endocrine   Diabetes mellitus without complication (HCC)    Chronic, historic condition.  Currently taking Jardiance 25 mg PO QD, Metformin 1000 mg PO BID and Semaglutide 1 mg/ dose IM once per week Previous A1c was 6.2 in March 2023- will recheck today Results to dictate further management Continue current regimen pending results Follow up in 3 months for monitoring       Relevant Medications   ferrous sulfate 324 (65 Fe) MG TBEC   JARDIANCE 25 MG TABS tablet   lisinopril-hydrochlorothiazide (ZESTORETIC) 20-12.5 MG tablet   metFORMIN (GLUCOPHAGE) 1000 MG tablet   Semaglutide, 1 MG/DOSE, (OZEMPIC, 1 MG/DOSE,) 4 MG/3ML SOPN   Other Relevant Orders   HgB A1c     Other   Iron deficiency anemia    Historic condition, unsure if resolved as patient was recently in hospital for GI bleed Will recheck CBC, iron panel today for monitoring Will refill iron supplements as well Results to dictate further management Follow up as indicated by labs       Relevant Medications   ferrous sulfate 324 (65 Fe) MG TBEC   Other Relevant Orders   Iron, TIBC and Ferritin Panel   CBC w/Diff   Other Visit Diagnoses     H/O vitamin  D deficiency       Relevant Medications   Vitamin D, Ergocalciferol, (DRISDOL) 1.25 MG (50000 UNIT) CAPS capsule   Other Relevant Orders   Vitamin D (25 hydroxy)   Need for tetanus, diphtheria, and acellular pertussis (Tdap) vaccine       Relevant Orders   Tdap vaccine greater than or equal to 7yo IM (Completed)        Return in about 3 months (around 09/23/2022) for Diabetes follow up, HTN.   I, Sherman Lipuma E Tionne Dayhoff, PA-C, have reviewed all documentation for this visit. The documentation on 06/24/22 for the exam, diagnosis, procedures, and orders are all accurate and complete.   Jacquelin Hawking, MHS, PA-C [facility]

## 2022-06-24 NOTE — Assessment & Plan Note (Signed)
Chronic, historic condition, appears stable and well controlled on current medication regimen - Carvedilol 25 mg PO QD, Zestoretic 20-12.5 mg PO QD  He appears to be tolerating this well  BP results at home continue to remain in goal  Continue current regimen  Follow up in 3 months for monitoring

## 2022-06-25 LAB — CBC WITH DIFFERENTIAL/PLATELET
Basophils Absolute: 0 10*3/uL (ref 0.0–0.2)
Basos: 0 %
EOS (ABSOLUTE): 0.2 10*3/uL (ref 0.0–0.4)
Eos: 4 %
Hematocrit: 43.3 % (ref 37.5–51.0)
Hemoglobin: 13.8 g/dL (ref 13.0–17.7)
Immature Grans (Abs): 0 10*3/uL (ref 0.0–0.1)
Immature Granulocytes: 0 %
Lymphocytes Absolute: 1.5 10*3/uL (ref 0.7–3.1)
Lymphs: 28 %
MCH: 29.1 pg (ref 26.6–33.0)
MCHC: 31.9 g/dL (ref 31.5–35.7)
MCV: 91 fL (ref 79–97)
Monocytes Absolute: 0.5 10*3/uL (ref 0.1–0.9)
Monocytes: 10 %
Neutrophils Absolute: 3 10*3/uL (ref 1.4–7.0)
Neutrophils: 58 %
Platelets: 307 10*3/uL (ref 150–450)
RBC: 4.75 x10E6/uL (ref 4.14–5.80)
RDW: 13.1 % (ref 11.6–15.4)
WBC: 5.3 10*3/uL (ref 3.4–10.8)

## 2022-06-25 LAB — HEMOGLOBIN A1C
Est. average glucose Bld gHb Est-mCnc: 157 mg/dL
Hgb A1c MFr Bld: 7.1 % — ABNORMAL HIGH (ref 4.8–5.6)

## 2022-06-25 LAB — IRON,TIBC AND FERRITIN PANEL
Ferritin: 22 ng/mL — ABNORMAL LOW (ref 30–400)
Iron Saturation: 12 % — ABNORMAL LOW (ref 15–55)
Iron: 49 ug/dL (ref 38–169)
Total Iron Binding Capacity: 401 ug/dL (ref 250–450)
UIBC: 352 ug/dL — ABNORMAL HIGH (ref 111–343)

## 2022-06-25 LAB — VITAMIN D 25 HYDROXY (VIT D DEFICIENCY, FRACTURES): Vit D, 25-Hydroxy: 45.2 ng/mL (ref 30.0–100.0)

## 2022-07-12 ENCOUNTER — Other Ambulatory Visit: Payer: Self-pay | Admitting: Nurse Practitioner

## 2022-07-13 NOTE — Telephone Encounter (Signed)
Requested Prescriptions  Pending Prescriptions Disp Refills  . Accu-Chek Softclix Lancets lancets [Pharmacy Med Name: ACCU-CHEK SOFTCLIX LANCETS] 100 each 1    Sig: USE [place]     Endocrinology: Diabetes - Testing Supplies Passed - 07/12/2022  1:03 PM      Passed - Valid encounter within last 12 months    Recent Outpatient Visits          2 weeks ago Primary hypertension   [name] Mecum, Dani Gobble, PA-C   4 months ago Primary hypertension   [name] Mecum, Dani Gobble, PA-C   6 months ago Primary hypertension   [place], [place], MD   7 months ago Anemia, unspecified type   [place], MD   8 months ago Anemia, unspecified type   [place], MD      Future Appointments            In 2 days [place], MD [place] [place]   In 2 months Mecum, Dani Gobble, PA-C [employer]

## 2022-07-15 ENCOUNTER — Ambulatory Visit: Payer: Medicare HMO | Attending: Cardiovascular Disease | Admitting: Cardiovascular Disease

## 2022-07-15 ENCOUNTER — Encounter: Payer: Self-pay | Admitting: Cardiovascular Disease

## 2022-07-15 ENCOUNTER — Ambulatory Visit (INDEPENDENT_AMBULATORY_CARE_PROVIDER_SITE_OTHER): Payer: Medicare HMO

## 2022-07-15 VITALS — BP 110/70 | HR 63 | Ht 64.0 in | Wt 194.0 lb

## 2022-07-15 DIAGNOSIS — E785 Hyperlipidemia, unspecified: Secondary | ICD-10-CM | POA: Diagnosis not present

## 2022-07-15 DIAGNOSIS — R079 Chest pain, unspecified: Secondary | ICD-10-CM | POA: Diagnosis not present

## 2022-07-15 DIAGNOSIS — R002 Palpitations: Secondary | ICD-10-CM

## 2022-07-15 DIAGNOSIS — I10 Essential (primary) hypertension: Secondary | ICD-10-CM

## 2022-07-15 DIAGNOSIS — R0602 Shortness of breath: Secondary | ICD-10-CM

## 2022-07-15 NOTE — Patient Instructions (Signed)
Medication Instructions:  Your physician recommends that you continue on your current medications as directed. Please refer to the Current Medication list given to you today.  *If you need a refill on your cardiac medications before your next appointment, please call your pharmacy*   Lab Work: None ordered If you have labs (blood work) drawn today and your tests are completely normal, you will receive your results only by: [place] (if you have MyChart) OR A paper copy in the mail If you have any lab test that is abnormal or we need to change your treatment, we will call you to review the results.   Testing/Procedures: Your physician has requested that you have an echocardiogram. Echocardiography is a painless test that uses sound waves to create images of your heart. It provides your doctor with information about the size and shape of your heart and how well your heart's chambers and valves are working. This procedure takes approximately one hour. There are no restrictions for this procedure.  Your provider has ordered a heart monitor to wear for 14 days. This will be mailed to your home with instructions on placement. Once you have finished the time frame requested, you will return monitor in box provided.       Follow-Up: At [facility], you and your health needs are our priority.  As part of our continuing mission to provide you with exceptional heart care, we have created designated Provider Care Teams.  These Care Teams include your primary Cardiologist (physician) and Advanced Practice Providers (APPs -  Physician Assistants and Nurse Practitioners) who all work together to provide you with the care you need, when you need it.  We recommend signing up for the patient portal called "MyChart".  Sign up information is provided on this After Visit Summary.  MyChart is used to connect with patients for Virtual Visits (Telemedicine).  Patients are able to view lab/test  results, encounter notes, upcoming appointments, etc.  Non-urgent messages can be sent to your provider as well.   To learn more about what you can do with MyChart, go to [email or web address].    Your next appointment:   2 month(s)  The format for your next appointment:   In Person  Provider:   You will see one of the following Advanced Practice Providers on your designated Care Team:   Murray Hodgkins, NP Christell Faith, PA-C Cadence Kathlen Mody, PA-C Gerrie Nordmann, NP    Other Instructions N/A  Important Information About Sugar

## 2022-07-15 NOTE — Progress Notes (Signed)
Cardiology Office Note   Date:  07/15/2022   ID:  Shane Preston, Shane Preston [birth year], MRN [id number]  PCP:  Shane Pardon, MD (Inactive)  Cardiologist:   Lorine Bears, MD   Chief Complaint  Patient presents with   Other    C/o chest discomfort feels cold like ice water and rapid heart beats. Meds reviewed verbally with pt.      History of Present Illness: Shane Preston is a 61 y.o. male who returns for follow-up regarding chest pain and palpitations.   He has known history of diabetes mellitus, essential hypertension, hyperlipidemia and TIA.  He had previous cardiac catheterization at [facility] in February 2012 which was normal.  He is not a smoker and has no family history of coronary artery disease.  He was hospitalized at [facility] with chest pain in April 2015.  He underwent a Lexiscan Myoview which showed no evidence of ischemia.   He was seen last year for atypical chest pain.  He underwent cardiac CTA in August which showed calcium score of 0 with no evidence of coronary artery disease. He now reports intermittent episodes of palpitations and tachycardia.  In addition, he continues to have vague left-sided chest discomfort with some shortness of breath.  However, the symptoms do not happen with exertion.  He tries to walk for exercise and has no exertional symptoms.   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   Lumbar stenosis    Neuropathy    Peripheral vascular disease (HCC)    TIA (transient ischemic attack)    Tinea pedis     Past Surgical History:  Procedure Laterality Date   BIOPSY N/A 04/20/2022   Procedure: BIOPSY;  Surgeon: Toney Reil, MD;  Location: [facility];  Service: Endoscopy;  Laterality: N/A;   CARDIAC CATHETERIZATION     [facility]     COLONOSCOPY WITH PROPOFOL N/A 10/30/2021   Procedure: COLONOSCOPY WITH PROPOFOL;  Surgeon: Toney Reil, MD;  Location: [facility] ENDOSCOPY;  Service: Gastroenterology;  Laterality: N/A;   COLONOSCOPY WITH PROPOFOL N/A 04/20/2022   Procedure: COLONOSCOPY WITH PROPOFOL;  Surgeon: Toney Reil, MD;  Location: [facility];  Service: Endoscopy;  Laterality: N/A;   ESOPHAGOGASTRODUODENOSCOPY (EGD) WITH PROPOFOL N/A 04/20/2022   Procedure: ESOPHAGOGASTRODUODENOSCOPY (EGD) WITH PROPOFOL;  Surgeon: Toney Reil, MD;  Location: [facility];  Service: Endoscopy;  Laterality: N/A;   EYE SURGERY     HERNIA REPAIR  10/11/2005   lumbar decompression surgery  10/11/2008     Current Outpatient Medications  Medication Sig Dispense Refill   ACCU-CHEK AVIVA PLUS test strip USE 1 THREE TIMES A DAY 100 strip 0   Accu-Chek Softclix Lancets lancets USE 1 LANCET THREE TIMES A DAY 100 each 1   carvedilol (COREG) 25 MG tablet Take 1 tablet (25 mg total) by mouth 2 (two) times daily. 180 tablet 1   ferrous sulfate 324 (65 Fe) MG TBEC Take 325 mg by mouth in the morning and at bedtime. 60 tablet 2   gabapentin (NEURONTIN) 300 MG capsule Take 1 capsule (300 mg total) by mouth 3 (three) times daily. 90 capsule 1   JARDIANCE 25 MG TABS tablet Take 1 tablet (25 mg total) by mouth daily. 90 tablet 1  levothyroxine (SYNTHROID) 25 MCG tablet Take 1 tablet (25 mcg total) by mouth every morning. 90 tablet 3   lisinopril-hydrochlorothiazide (ZESTORETIC) 20-12.5 MG tablet Take 1 tablet by mouth daily. 90 tablet 1   Magnesium Oxide -Mg Supplement 250 MG TABS Take 1 tablet by mouth daily.     metFORMIN (GLUCOPHAGE) 1000 MG tablet Take 1 tablet (1,000 mg total) by mouth 2 (two) times daily. 180 tablet 0   nitroGLYCERIN (NITROSTAT) 0.4 MG SL tablet Place 0.4 mg under the tongue every 5 (five) minutes as needed for chest pain.     omeprazole (PRILOSEC) 20 MG capsule Take 1 capsule (20 mg total) by mouth 2  (two) times daily before a meal for 14 days. 28 capsule 0   rosuvastatin (CRESTOR) 20 MG tablet Take 1 tablet (20 mg total) by mouth daily. 30 tablet 6   Semaglutide, 1 MG/DOSE, (OZEMPIC, 1 MG/DOSE,) 4 MG/3ML SOPN Inject 1 mg into the muscle once a week. 3 mL 2   Vitamin D, Ergocalciferol, (DRISDOL) 1.25 MG (50000 UNIT) CAPS capsule TAKE 1 CAPSULE 1 TIME A WEEK 12 capsule 0   No current facility-administered medications for this visit.    Allergies:   Patient has no known allergies.    Social History:  The patient  reports that he has never smoked. He has never used smokeless tobacco. He reports that he does not currently use alcohol. He reports that he does not currently use drugs.   Family History:  The patient's family history is negative for coronary artery disease.   ROS:  Please see the history of present illness.   Otherwise, review of systems are positive for none.   All other systems are reviewed and negative.    PHYSICAL EXAM: VS:  BP 110/70 (BP Location: Left Arm, Patient Position: Sitting, Cuff Size: Normal)   Pulse 63   Ht 5\' 4"  (1.626 m)   Wt 194 lb (88 kg)   SpO2 98%   BMI 33.30 kg/m  , BMI Body mass index is 33.3 kg/m. GEN: Well nourished, well developed, in no acute distress  HEENT: normal  Neck: no JVD, carotid bruits, or masses Cardiac: RRR; no murmurs, rubs, or gallops,no edema  Respiratory:  clear to auscultation bilaterally, normal work of breathing GI: soft, nontender, nondistended, + BS MS: no deformity or atrophy  Skin: warm and dry, no rash Neuro:  Strength and sensation are intact Psych: euthymic mood, full affect   EKG:  EKG is ordered today. The ekg ordered today demonstrates normal sinus rhythm with no significant ST or T wave changes.   Recent Labs: 04/04/2022: ALT 21; BUN 11; Creatinine, Ser 0.96; Potassium 3.7; Sodium 132 06/24/2022: Hemoglobin 13.8; Platelets 307    Lipid Panel    Component Value Date/Time   CHOL 122 11/18/2021 1105    TRIG 198 (H) 11/18/2021 1105   HDL 38 (L) 11/18/2021 1105   CHOLHDL 3.2 11/18/2021 1105   LDLCALC 52 11/18/2021 1105      Wt Readings from Last 3 Encounters:  07/15/22 194 lb (88 kg)  06/24/22 195 lb 9.6 oz (88.7 kg)  06/10/22 197 lb 4 oz (89.5 kg)          05/19/2021    2:05 PM  PAD Screen  Previous PAD dx? No  Previous surgical procedure? Yes  Pain with walking? No  Feet/toe relief with dangling? No  Painful, non-healing ulcers? No  Extremities discolored? No      ASSESSMENT AND PLAN:  1.  Chronic atypical chest pain: Ischemic cardiac testing over the years have been negative most recently with cardiac CTA in August 2020 which showed calcium score of 0 with no evidence of coronary artery disease.  No further ischemic cardiac evaluation is needed now.  2.  Palpitations and intermittent tachycardia: Baseline EKG is normal.  I requested a 2 weeks ZIO monitor.  Given his complaints of some shortness of breath, I requested an echocardiogram to evaluate ejection fraction.  3.  Essential hypertension: His blood pressure is well controlled on current medications.  3.  Hyperlipidemia: Currently on rosuvastatin 10 mg daily.  Most recent lipid profile showed an LDL of 52.  He is diabetic.    Disposition: Follow-up after cardiac testing in 2 months.  Signed,  Kathlyn Sacramento, MD  07/15/2022 9:30 AM    [place]

## 2022-07-17 DIAGNOSIS — R002 Palpitations: Secondary | ICD-10-CM

## 2022-07-19 ENCOUNTER — Other Ambulatory Visit: Payer: Self-pay | Admitting: Physician Assistant

## 2022-07-19 DIAGNOSIS — E119 Type 2 diabetes mellitus without complications: Secondary | ICD-10-CM

## 2022-07-20 ENCOUNTER — Encounter: Payer: Self-pay | Admitting: Nurse Practitioner

## 2022-07-20 NOTE — Telephone Encounter (Signed)
Requested medication (s) are due for refill today: yes  Requested medication (s) are on the active medication list: yes  Last refill:  03/15/22 #90/1  Future visit scheduled: yes  Notes to clinic:  Dr. Neomia Dear pt, has been seen by Junie Panning, PA     Requested Prescriptions  Pending Prescriptions Disp Refills   gabapentin (NEURONTIN) 300 MG capsule [Pharmacy Med Name: GABAPENTIN 300 MG CAPSULE] 90 capsule 0    Sig: Take 1 capsule (300 mgtotal) by mouth 3 (three) times daily.     Neurology: Anticonvulsants - gabapentin Passed - 07/19/2022  1:16 PM      Passed - Cr in normal range and within 360 days    Creatinine  Date Value Ref Range Status  01/10/2014 0.82 0.60 - 1.30 mg/dL Final   Creatinine, Ser  Date Value Ref Range Status  04/04/2022 0.96 0.61 - 1.24 mg/dL Final         Passed - Completed PHQ-2 or PHQ-9 in the last 360 days      Passed - Valid encounter within last 12 months    Recent Outpatient Visits           3 weeks ago Primary hypertension   [name] Mecum, Dani Gobble, PA-C   4 months ago Primary hypertension   [name] Mecum, Dani Gobble, PA-C   6 months ago Primary hypertension   [name] Vigg, Avanti, MD   8 months ago Anemia, unspecified type   [facility] Vigg, Avanti, MD   9 months ago Anemia, unspecified type   Bill[place], MD       Future Appointments             In 1 month Furth, Cadence H, PA-C [place]. [place]   In 2 months Mecum, Dani Gobble, PA-C [employer], [facility]

## 2022-07-22 ENCOUNTER — Other Ambulatory Visit: Payer: Self-pay | Admitting: Physician Assistant

## 2022-07-22 DIAGNOSIS — E119 Type 2 diabetes mellitus without complications: Secondary | ICD-10-CM

## 2022-07-22 NOTE — Telephone Encounter (Signed)
Requested Prescriptions  Pending Prescriptions Disp Refills  . metFORMIN (GLUCOPHAGE) 1000 MG tablet [Pharmacy Med Name: METFORMIN HCL 1,000 MG TABLET] 180 tablet 0    Sig: Take 1 tablet (1,000 mg total) by mouth 2 (two) times daily.     Endocrinology:  Diabetes - Biguanides Passed - 07/22/2022 11:15 AM      Passed - Cr in normal range and within 360 days    Creatinine  Date Value Ref Range Status  01/10/2014 0.82 0.60 - 1.30 mg/dL Final   Creatinine, Ser  Date Value Ref Range Status  04/04/2022 0.96 0.61 - 1.24 mg/dL Final         Passed - HBA1C is between 0 and 7.9 and within 180 days    HB A1C (BAYER DCA - WAIVED)  Date Value Ref Range Status  12/30/2021 6.2 (H) 4.8 - 5.6 % Final    Comment:             Prediabetes: 5.7 - 6.4          Diabetes: >6.4          Glycemic control for adults with diabetes: <7.0    Hgb A1c MFr Bld  Date Value Ref Range Status  06/24/2022 7.1 (H) 4.8 - 5.6 % Final    Comment:             Prediabetes: 5.7 - 6.4          Diabetes: >6.4          Glycemic control for adults with diabetes: <7.0          Passed - eGFR in normal range and within 360 days    EGFR (African American)  Date Value Ref Range Status  01/10/2014 >60  Final   GFR calc Af Amer  Date Value Ref Range Status  12/09/2019 >60 >60 mL/min Final   EGFR (Non-African Amer.)  Date Value Ref Range Status  01/10/2014 >60  Final    Comment:    eGFR values <64mL/min/1.73 m2 may be an indication of chronic kidney disease (CKD). Calculated eGFR is useful in patients with stable renal function. The eGFR calculation will not be reliable in acutely ill patients when serum creatinine is changing rapidly. It is not useful in  patients on dialysis. The eGFR calculation may not be applicable to patients at the low and high extremes of body sizes, pregnant women, and vegetarians.    GFR, Estimated  Date Value Ref Range Status  04/04/2022 >60 >60 mL/min Final    Comment:     (NOTE) Calculated using the CKD-EPI Creatinine Equation (2021)    eGFR  Date Value Ref Range Status  11/18/2021 88 >59 mL/min/1.73 Final         Passed - B12 Level in normal range and within 720 days    Vitamin B-12  Date Value Ref Range Status  10/14/2021 412 232 - 1,245 pg/mL Final         Passed - Valid encounter within last 6 months    Recent Outpatient Visits          4 weeks ago Primary hypertension   [name] Mecum, Dani Gobble, PA-C   4 months ago Primary hypertension   [name] Mecum, Dani Gobble, PA-C   6 months ago Primary hypertension   [place] Vigg, Avanti, MD   8 months ago Anemia, unspecified type   [facility] Vigg, Avanti, MD   9 months ago Anemia, unspecified  type   Silvana, MD      Future Appointments            In 1 month Furth, Cadence H, PA-C [place]. [place]   In 2 months Jon Billings, NP [name], [facility]           Passed - CBC within normal limits and completed in the last 12 months    WBC  Date Value Ref Range Status  06/24/2022 5.3 3.4 - 10.8 x10E3/uL Final  04/05/2022 5.3 4.0 - 10.5 K/uL Final   RBC  Date Value Ref Range Status  06/24/2022 4.75 4.14 - 5.80 x10E6/uL Final  04/05/2022 4.51 4.22 - 5.81 MIL/uL Final   Hemoglobin  Date Value Ref Range Status  06/24/2022 13.8 13.0 - 17.7 g/dL Final   Hematocrit  Date Value Ref Range Status  06/24/2022 43.3 37.5 - 51.0 % Final   MCHC  Date Value Ref Range Status  06/24/2022 31.9 31.5 - 35.7 g/dL Final  04/05/2022 33.9 30.0 - 36.0 g/dL Final   [facility]  Date Value Ref Range Status  06/24/2022 29.1 26.6 - 33.0 pg Final  04/05/2022 29.3 26.0 - 34.0 pg Final   MCV  Date Value Ref Range Status  06/24/2022 91 79 - 97 fL Final  01/10/2014 86 80 - 100 fL Final   No results found for: "PLTCOUNTKUC", "LABPLAT", "POCPLA" RDW  Date Value Ref  Range Status  06/24/2022 13.1 11.6 - 15.4 % Final  01/10/2014 12.8 11.5 - 14.5 % Final

## 2022-07-28 ENCOUNTER — Encounter: Payer: Self-pay | Admitting: Oncology

## 2022-07-28 ENCOUNTER — Inpatient Hospital Stay: Payer: Medicare HMO | Attending: Oncology

## 2022-07-28 ENCOUNTER — Inpatient Hospital Stay (HOSPITAL_BASED_OUTPATIENT_CLINIC_OR_DEPARTMENT_OTHER): Payer: Medicare HMO | Admitting: Oncology

## 2022-07-28 VITALS — BP 100/57 | HR 74 | Temp 98.6°F | Resp 18 | Wt 192.5 lb

## 2022-07-28 DIAGNOSIS — D500 Iron deficiency anemia secondary to blood loss (chronic): Secondary | ICD-10-CM

## 2022-07-28 DIAGNOSIS — D509 Iron deficiency anemia, unspecified: Secondary | ICD-10-CM | POA: Insufficient documentation

## 2022-07-28 LAB — IRON AND TIBC
Iron: 94 ug/dL (ref 45–182)
Saturation Ratios: 22 % (ref 17.9–39.5)
TIBC: 438 ug/dL (ref 250–450)
UIBC: 344 ug/dL

## 2022-07-28 LAB — CBC WITH DIFFERENTIAL/PLATELET
Abs Immature Granulocytes: 0.02 10*3/uL (ref 0.00–0.07)
Basophils Absolute: 0 10*3/uL (ref 0.0–0.1)
Basophils Relative: 0 %
Eosinophils Absolute: 0.1 10*3/uL (ref 0.0–0.5)
Eosinophils Relative: 2 %
HCT: 46 % (ref 39.0–52.0)
Hemoglobin: 14.9 g/dL (ref 13.0–17.0)
Immature Granulocytes: 0 %
Lymphocytes Relative: 25 %
Lymphs Abs: 1.9 10*3/uL (ref 0.7–4.0)
MCH: 28.2 pg (ref 26.0–34.0)
MCHC: 32.4 g/dL (ref 30.0–36.0)
MCV: 87.1 fL (ref 80.0–100.0)
Monocytes Absolute: 0.5 10*3/uL (ref 0.1–1.0)
Monocytes Relative: 6 %
Neutro Abs: 5 10*3/uL (ref 1.7–7.7)
Neutrophils Relative %: 67 %
Platelets: 295 10*3/uL (ref 150–400)
RBC: 5.28 MIL/uL (ref 4.22–5.81)
RDW: 13.1 % (ref 11.5–15.5)
WBC: 7.6 10*3/uL (ref 4.0–10.5)
nRBC: 0 % (ref 0.0–0.2)

## 2022-07-28 LAB — FERRITIN: Ferritin: 24 ng/mL (ref 24–336)

## 2022-07-28 NOTE — Progress Notes (Signed)
Pt here for follow up. Pt needs ferrous sulfate refill if Dr. Tasia Catchings would like for him to proceed with oral supplement.

## 2022-07-28 NOTE — Progress Notes (Signed)
Hematology/Oncology Progress note [phone] [phone]         Patient Care Team: Jon Billings, NP as PCP - General (Nurse Practitioner) Marguerita Merles, MD (Family Medicine) Earlie Server, MD as Consulting Physician (Hematology and Oncology) Lin Landsman, MD as Consulting Physician (Gastroenterology) Wellington Hampshire, MD as Consulting Physician (Cardiology)  ASSESSMENT & PLAN:   Iron deficiency anemia Iron deficiency anemia secondary to chronic GI blood loss. Status post IV Venofer treatments Labs reviewed and discussed patient Hemoglobin has normalized.  Iron panel has been stable.  No need for IV venfoer or oral iron supplementation.  Orders Placed This Encounter  Procedures   CBC with Differential/Platelet    Standing Status:   Future    Standing Expiration Date:   07/29/2023   Iron and TIBC    Standing Status:   Future    Standing Expiration Date:   07/29/2023   Ferritin    Standing Status:   Future    Standing Expiration Date:   07/29/2023   Follow up in 6 months. All questions were answered. The patient knows to call the clinic with any problems, questions or concerns.  Earlie Server, MD, PhD [facility] Hematology Oncology 07/28/2022   CHIEF COMPLAINTS/REASON FOR VISIT:  Follow-up for iron deficiency anemia HISTORY OF PRESENTING ILLNESS:   Shane Preston is a  61 y.o.  male with PMH listed below was seen in consultation at the request of  Vigg, Avanti, MD  for evaluation of anemia 10/13/2021, hemoglobin 10.4, MCV 80. 10/14/2021, iron panel showed iron saturation 7, ferritin 8, TIBC 417. 10/21/2021, patient had a CBC done which showed a hemoglobin of 9.8, MCV 81. Review previous lab results.  Anemia is new onset, since October 2022. Patient reports painless rectal bleeding.  Has been referred to establish care with gastroenterology Dr. Marius Ditch and has a colonoscopy scheduled.   INTERVAL HISTORY Shane Preston is a 61  y.o. male who has above history reviewed by me today presents for follow up visit for Iron deficiency anemia Today patient reports feeling well.  Status post IV Venofer treatments. Fatigue has improved. Denies any additional episodes of bloody stool. 10/30/2021, patient had colonoscopy done by Dr. Marius Ditch colonoscopy showed poor preparation of the colon.  Solitary ulcer in the distal rectum.  Patient was recommended to follow-up in 6 months with 2-day prep.  Review of Systems  Constitutional:  Negative for appetite change, chills, fatigue, fever and unexpected weight change.  HENT:   Negative for hearing loss and voice change.   Eyes:  Negative for eye problems and icterus.  Respiratory:  Negative for chest tightness, cough and shortness of breath.   Cardiovascular:  Negative for chest pain and leg swelling.  Gastrointestinal:  Negative for abdominal distention, abdominal pain and blood in stool.  Endocrine: Negative for hot flashes.  Genitourinary:  Negative for difficulty urinating, dysuria and frequency.   Musculoskeletal:  Negative for arthralgias.  Skin:  Negative for itching and rash.  Neurological:  Negative for light-headedness and numbness.  Hematological:  Negative for adenopathy. Does not bruise/bleed easily.  Psychiatric/Behavioral:  Negative for confusion.     MEDICAL HISTORY:  Past Medical History:  Diagnosis Date   Anemia    Coronary artery disease    Diabetes mellitus without complication ([place])    Hemorrhoids    Hyperlipidemia    Hypertension    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   Lumbar stenosis    Neuropathy    Peripheral  vascular disease (HCC)    TIA (transient ischemic attack)    Tinea pedis     SURGICAL HISTORY: Past Surgical History:  Procedure Laterality Date   BIOPSY N/A 04/20/2022   Procedure: BIOPSY;  Surgeon: Toney Reil, MD;  Location: [facility];  Service: Endoscopy;  Laterality: N/A;   CARDIAC CATHETERIZATION      [facility]    COLONOSCOPY WITH PROPOFOL N/A 10/30/2021   Procedure: COLONOSCOPY WITH PROPOFOL;  Surgeon: Toney Reil, MD;  Location: [facility] ENDOSCOPY;  Service: Gastroenterology;  Laterality: N/A;   COLONOSCOPY WITH PROPOFOL N/A 04/20/2022   Procedure: COLONOSCOPY WITH PROPOFOL;  Surgeon: Toney Reil, MD;  Location: [facility];  Service: Endoscopy;  Laterality: N/A;   ESOPHAGOGASTRODUODENOSCOPY (EGD) WITH PROPOFOL N/A 04/20/2022   Procedure: ESOPHAGOGASTRODUODENOSCOPY (EGD) WITH PROPOFOL;  Surgeon: Toney Reil, MD;  Location: [facility];  Service: Endoscopy;  Laterality: N/A;   EYE SURGERY     HERNIA REPAIR  10/11/2005   lumbar decompression surgery  10/11/2008    SOCIAL HISTORY: Social History   Socioeconomic History   Marital status: Married    Spouse name: Not on file   Number of children: Not on file   Years of education: Not on file   Highest education level: Not on file  Occupational History   Not on file  Tobacco Use   Smoking status: Never   Smokeless tobacco: Never  Vaping Use   Vaping Use: Never used  Substance and Sexual Activity   Alcohol use: Not Currently   Drug use: Not Currently   Sexual activity: Not Currently  Other Topics Concern   Not on file  Social History Narrative   ** Merged History Encounter **       Social Determinants of Health   Financial Resource Strain: Low Risk  (09/01/2021)   Overall Financial Resource Strain (CARDIA)    Difficulty of Paying Living Expenses: Not hard at all  Food Insecurity: No Food Insecurity (09/01/2021)   Hunger Vital Sign    Worried About Running Out of Food in the Last Year: Never true    Ran Out of Food in the Last Year: Never true  Transportation Needs: No Transportation Needs (09/01/2021)   PRAPARE - Administrator, Civil Service (Medical): No    Lack of Transportation (Non-Medical): No  Physical Activity: Inactive (09/01/2021)   Exercise Vital Sign     Days of Exercise per Week: 0 days    Minutes of Exercise per Session: 0 min  Stress: No Stress Concern Present (09/01/2021)   [employer] of Occupational Health - Occupational Stress Questionnaire    Feeling of Stress : Not at all  Social Connections: Moderately Isolated (09/01/2021)   Social Connection and Isolation Panel [NHANES]    Frequency of Communication with Friends and Family: More than three times a week    Frequency of Social Gatherings with Friends and Family: More than three times a week    Attends Religious Services: Never    Database administrator or Organizations: No    Attends Banker Meetings: Never    Marital Status: Married  Catering manager Violence: Not At Risk (09/01/2021)   Humiliation, Afraid, Rape, and Kick questionnaire    Fear of Current or Ex-Partner: No    Emotionally Abused: No    Physically Abused: No    Sexually Abused: No    FAMILY HISTORY: Family History  Family history unknown: Yes    ALLERGIES:  has No Known Allergies.  MEDICATIONS:  Current Outpatient Medications  Medication Sig Dispense Refill   ACCU-CHEK AVIVA PLUS test strip USE 1 THREE TIMES A DAY 100 strip 0   Accu-Chek Softclix Lancets lancets USE 1 LANCET THREE TIMES A DAY 100 each 1   carvedilol (COREG) 25 MG tablet Take 1 tablet (25 mg total) by mouth 2 (two) times daily. 180 tablet 1   cyanocobalamin (VITAMIN B12) 1000 MCG tablet Take 1,000 mcg by mouth daily.     gabapentin (NEURONTIN) 300 MG capsule Take 1 capsule (300 mgtotal) by mouth 3 (three) times daily. 90 capsule 0   JARDIANCE 25 MG TABS tablet Take 1 tablet (25 mg total) by mouth daily. 90 tablet 1   levothyroxine (SYNTHROID) 25 MCG tablet Take 1 tablet (25 mcg total) by mouth every morning. 90 tablet 3   lisinopril-hydrochlorothiazide (ZESTORETIC) 20-12.5 MG tablet Take 1 tablet by mouth daily. 90 tablet 1   metFORMIN (GLUCOPHAGE) 1000 MG tablet Take 1 tablet (1,000 mg total) by mouth 2 (two) times  daily. 180 tablet 0   rosuvastatin (CRESTOR) 20 MG tablet Take 1 tablet (20 mg total) by mouth daily. 30 tablet 6   Semaglutide, 1 MG/DOSE, (OZEMPIC, 1 MG/DOSE,) 4 MG/3ML SOPN Inject 1 mg into the muscle once a week. 3 mL 2   Vitamin D, Ergocalciferol, (DRISDOL) 1.25 MG (50000 UNIT) CAPS capsule TAKE 1 CAPSULE 1 TIME A WEEK 12 capsule 0   ferrous sulfate 324 (65 Fe) MG TBEC Take 325 mg by mouth in the morning and at bedtime. (Patient not taking: Reported on 07/28/2022) 60 tablet 2   Magnesium Oxide -Mg Supplement 250 MG TABS Take 1 tablet by mouth daily. (Patient not taking: Reported on 07/28/2022)     nitroGLYCERIN (NITROSTAT) 0.4 MG SL tablet Place 0.4 mg under the tongue every 5 (five) minutes as needed for chest pain. (Patient not taking: Reported on 07/28/2022)     omeprazole (PRILOSEC) 20 MG capsule Take 1 capsule (20 mg total) by mouth 2 (two) times daily before a meal for 14 days. 28 capsule 0   No current facility-administered medications for this visit.     PHYSICAL EXAMINATION: ECOG PERFORMANCE STATUS: 1 - Symptomatic but completely ambulatory Vitals:   07/28/22 1420  BP: (!) 100/57  Pulse: 74  Resp: 18  Temp: 98.6 F (37 C)   Filed Weights   07/28/22 1420  Weight: 192 lb 8 oz (87.3 kg)    Physical Exam Constitutional:      General: He is not in acute distress. HENT:     Head: Normocephalic and atraumatic.  Eyes:     General: No scleral icterus. Cardiovascular:     Rate and Rhythm: Normal rate and regular rhythm.     Heart sounds: Normal heart sounds.  Pulmonary:     Effort: Pulmonary effort is normal. No respiratory distress.     Breath sounds: No wheezing.  Abdominal:     General: Bowel sounds are normal. There is no distension.     Palpations: Abdomen is soft.  Musculoskeletal:        General: No deformity. Normal range of motion.     Cervical back: Normal range of motion and neck supple.  Skin:    General: Skin is warm and dry.     Findings: No erythema  or rash.  Neurological:     Mental Status: He is alert and oriented to person, place, and time. Mental status is at baseline.  Cranial Nerves: No cranial nerve deficit.     Coordination: Coordination normal.  Psychiatric:        Mood and Affect: Mood normal.     LABORATORY DATA:  I have reviewed the data as listed    Latest Ref Rng & Units 07/28/2022    1:54 PM 06/24/2022   11:33 AM 04/05/2022    9:26 AM  CBC  WBC 4.0 - 10.5 K/uL 7.6  5.3  5.3   Hemoglobin 13.0 - 17.0 g/dL 73.4  19.3  79.0   Hematocrit 39.0 - 52.0 % 46.0  43.3  38.9   Platelets 150 - 400 K/uL 295  307  276       Latest Ref Rng & Units 04/04/2022    7:04 AM 11/18/2021   11:05 AM 07/14/2021    8:19 AM  CMP  Glucose 70 - 99 mg/dL 240  93  973   BUN 8 - 23 mg/dL 11  12  12    Creatinine 0.61 - 1.24 mg/dL  5.32  9.92   Sodium 135 - 145 mmol/L 132  137  138   Potassium 3.5 - 5.1 mmol/L 3.7  4.6  4.8   Chloride 98 - 111 mmol/L 100  100  100   CO2 22 - 32 mmol/L 22  22  19    Calcium 8.9 - 10.3 mg/dL 9.1  9.5  9.5   Total Protein 6.5 - 8.1 g/dL 8.2  7.4  7.4   Total Bilirubin 0.3 - 1.2 mg/dL 1.1  0.2  0.2   Alkaline Phos 38 - 126 U/L 64  75  82   AST 15 - 41 U/L 17  14  13    ALT 0 - 44 U/L 21  16  15     Lab Results  Component Value Date   IRON 94 07/28/2022   TIBC 438 07/28/2022   FERRITIN 24 07/28/2022       RADIOGRAPHIC STUDIES: I have personally reviewed the radiological images as listed and agreed with the findings in the report. No results found.

## 2022-07-28 NOTE — Assessment & Plan Note (Signed)
Iron deficiency anemia secondary to chronic GI blood loss. Status post IV Venofer treatments Labs reviewed and discussed patient Hemoglobin has normalized.  Iron panel has been stable.  No need for IV venfoer or oral iron supplementation.

## 2022-08-04 DIAGNOSIS — R002 Palpitations: Secondary | ICD-10-CM | POA: Diagnosis not present

## 2022-08-30 ENCOUNTER — Other Ambulatory Visit: Payer: Self-pay | Admitting: Nurse Practitioner

## 2022-08-30 NOTE — Telephone Encounter (Signed)
Requested Prescriptions  Pending Prescriptions Disp Refills   ACCU-CHEK AVIVA PLUS test strip [Pharmacy Med Name: ACCU-CHEK AVIVA PLUS TEST STRP] 100 strip 0    Sig: USE 1 THREE TIMES A DAY     Endocrinology: Diabetes - Testing Supplies Passed - 08/30/2022  9:38 AM      Passed - Valid encounter within last 12 months    Recent Outpatient Visits           2 months ago Primary hypertension   [name] Mecum, [name], PA-C   5 months ago Primary hypertension   [name] Mecum, [name], PA-C   8 months ago Primary hypertension   [name] Vigg, Avanti, MD   9 months ago Anemia, unspecified type   [facility] Vigg, Avanti, MD   10 months ago Anemia, unspecified type   [facility] Vigg, Avanti, MD       Future Appointments             In 2 weeks Furth, Cadence H, PA-C [place] [employer] [facility] [place]   In 3 weeks [name], NP [facility], [facility]

## 2022-08-31 ENCOUNTER — Ambulatory Visit: Payer: Medicare HMO | Attending: Cardiovascular Disease

## 2022-08-31 DIAGNOSIS — R079 Chest pain, unspecified: Secondary | ICD-10-CM | POA: Diagnosis not present

## 2022-08-31 DIAGNOSIS — R0602 Shortness of breath: Secondary | ICD-10-CM

## 2022-08-31 LAB — ECHOCARDIOGRAM COMPLETE
AR max vel: 4.07 cm2
AV Area VTI: 3.99 cm2
AV Area mean vel: 3.75 cm2
AV Mean grad: 3 mmHg
AV Peak grad: 5.2 mmHg
Ao pk vel: 1.14 m/s
Area-P 1/2: 3.23 cm2
Calc EF: 58.5 %
S' Lateral: 3.9 cm
Single Plane A2C EF: 53.4 %
Single Plane A4C EF: 61.6 %

## 2022-09-08 ENCOUNTER — Other Ambulatory Visit: Payer: Self-pay | Admitting: Physician Assistant

## 2022-09-08 DIAGNOSIS — Z8639 Personal history of other endocrine, nutritional and metabolic disease: Secondary | ICD-10-CM

## 2022-09-08 NOTE — Telephone Encounter (Signed)
Requested medication (s) are due for refill today: {yes  Requested medication (s) are on the active medication list: yes  Last refill:  06/24/22  Future visit scheduled: yes  Notes to clinic:  Manual Review: Route requests for 50,000 IU strength to the provider.     Requested Prescriptions  Pending Prescriptions Disp Refills   Vitamin D, Ergocalciferol, (DRISDOL) 1.25 MG (50000 UNIT) CAPS capsule [Pharmacy Med Name: VITAMIN D2 1.25MG (50,000 UNIT)] 12 capsule 0    Sig: TAKE 1 CAPSULE 1 TIME A WEEK     Endocrinology:  Vitamins - Vitamin D Supplementation 2 Failed - 09/08/2022  9:42 AM      Failed - Manual Review: Route requests for 50,000 IU strength to the provider      Passed - Ca in normal range and within 360 days    Calcium  Date Value Ref Range Status  04/04/2022 9.1 8.9 - 10.3 mg/dL Final   Calcium, Total  Date Value Ref Range Status  01/10/2014 8.5 8.5 - 10.1 mg/dL Final   Calcium, Ion  Date Value Ref Range Status  11/23/2014 1.19 1.12 - 1.23 mmol/L Final         Passed - Vitamin D in normal range and within 360 days    Vit D, 25-Hydroxy  Date Value Ref Range Status  06/24/2022 45.2 30.0 - 100.0 ng/mL Final    Comment:    Vitamin D deficiency has been defined by the Institute of Medicine and an Endocrine Society practice guideline as a level of serum 25-OH vitamin D less than 20 ng/mL (1,2). The Endocrine Society went on to further define vitamin D insufficiency as a level between 21 and 29 ng/mL (2). 1. IOM (Institute of Medicine). 2010. Dietary reference    intakes for calcium and D. [place]: The    [employer]. 2. Holick MF, Binkley [place], Bischoff-Ferrari HA, et al.    Evaluation, treatment, and prevention of vitamin D    deficiency: an Endocrine Society clinical practice    guideline. JCEM. 2011 Jul; 96(7):1911-30.          Passed - Valid encounter within last 12 months    Recent Outpatient Visits           2 months ago Primary  hypertension   [name] Mecum, [name], PA-C   5 months ago Primary hypertension   [name] Mecum, [name], PA-C   8 months ago Primary hypertension   [name] Vigg, Avanti, MD   9 months ago Anemia, unspecified type   [facility] Vigg, Avanti, MD   10 months ago Anemia, unspecified type   [facility] Vigg, Avanti, MD       Future Appointments             In 6 days Furth, [employer], PA-C [facility]   In 2 weeks [name], NP [facility], [facility]

## 2022-09-13 NOTE — Progress Notes (Unsigned)
Cardiology Office Note:    Date:  09/14/2022   ID:  Shane Preston 1961/02/19, MRN [id number]  PCP:  [name], NP  [facility] Cardiologist:  None  [facility] Electrophysiologist:  None   Referring MD: No ref. provider found   Chief Complaint: 2 month follow-up  History of Present Illness:    Shane Preston is a 61 y.o. male with a hx of diabetes type 2, hypertension, hyperlipidemia, TIA who presents for 44-month follow-up.  Patient had a previous cardiac cath at [facility] in February 2012 which was normal.  He was hospitalized at [facility] in April 2015 with chest pain.  Lexi scan at that time showed no evidence of ischemia.  Patient was seen in 2022 for chest pain.  He underwent cardiac CTA in August which showed calcium score of 0 with no evidence of CAD.  Patient was last seen 07/15/2022 for palpitations and tachycardia, with left-sided chest pain.  A heart monitor and an echocardiogram were ordered. Heart monitor showed predominantly normal rhythm, first-degree AV block, 1 run of SVT lasting 4 beats, rare PACs and PVCs, triggered events did not correlate with arrhythmia. Echo showed LVEF 55-60%, no WMA, G1DD, normal RVSF, no MR.   Today, the echo and heart monitor were reviewed.  Patient reports occasional palpitations that do not interfere with his daily life.  He also reports no overt chest pain, however he says he feels like his heart gets cold on the left side.  This occasionally happens.  He has no symptoms that are worse with exertion.  Patient denies lightheadedness, dizziness, orthopnea, lower leg edema.  Patient is no longer working due to prior back surgery.  Vitals are stable today.  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   Lumbar stenosis    Neuropathy     Peripheral vascular disease (HCC)    TIA (transient ischemic attack)    Tinea pedis     Past Surgical History:  Procedure Laterality Date   BIOPSY N/A 04/20/2022   Procedure: BIOPSY;  Surgeon: Toney Reil, MD;  Location: [facility];  Service: Endoscopy;  Laterality: N/A;   CARDIAC CATHETERIZATION     [facility]    COLONOSCOPY WITH PROPOFOL N/A 10/30/2021   Procedure: COLONOSCOPY WITH PROPOFOL;  Surgeon: Toney Reil, MD;  Location: [facility];  Service: Gastroenterology;  Laterality: N/A;   COLONOSCOPY WITH PROPOFOL N/A 04/20/2022   Procedure: COLONOSCOPY WITH PROPOFOL;  Surgeon: Toney Reil, MD;  Location: [facility];  Service: Endoscopy;  Laterality: N/A;   ESOPHAGOGASTRODUODENOSCOPY (EGD) WITH PROPOFOL N/A 04/20/2022   Procedure: ESOPHAGOGASTRODUODENOSCOPY (EGD) WITH PROPOFOL;  Surgeon: Toney Reil, MD;  Location: [facility];  Service: Endoscopy;  Laterality: N/A;   EYE SURGERY     HERNIA REPAIR  10/11/2005   lumbar decompression surgery  10/11/2008    Current Medications: No outpatient medications have been marked as taking for the 09/14/22 encounter (Office Visit) with Fransico Michael, Lindsay Straka H, PA-C.     Allergies:   Patient has no known allergies.   Social History   Socioeconomic History   Marital status: Married    Spouse name: Not on file   Number of children: Not on file   Years of education: Not on file  Highest education level: Not on file  Occupational History   Not on file  Tobacco Use   Smoking status: Never   Smokeless tobacco: Never  Vaping Use   Vaping Use: Never used  Substance and Sexual Activity   Alcohol use: Not Currently   Drug use: Not Currently   Sexual activity: Not Currently  Other Topics Concern   Not on file  Social History Narrative   ** Merged History Encounter **       Social Determinants of Health   Financial Resource Strain: Low Risk  (09/01/2021)   Overall Financial  Resource Strain (CARDIA)    Difficulty of Paying Living Expenses: Not hard at all  Food Insecurity: No Food Insecurity (09/01/2021)   Hunger Vital Sign    Worried About Running Out of Food in the Last Year: Never true    Ran Out of Food in the Last Year: Never true  Transportation Needs: No Transportation Needs (09/01/2021)   PRAPARE - Administrator, Civil Service (Medical): No    Lack of Transportation (Non-Medical): No  Physical Activity: Inactive (09/01/2021)   Exercise Vital Sign    Days of Exercise per Week: 0 days    Minutes of Exercise per Session: 0 min  Stress: No Stress Concern Present (09/01/2021)   [employer] of Occupational Health - Occupational Stress Questionnaire    Feeling of Stress : Not at all  Social Connections: Moderately Isolated (09/01/2021)   Social Connection and Isolation Panel [NHANES]    Frequency of Communication with Friends and Family: More than three times a week    Frequency of Social Gatherings with Friends and Family: More than three times a week    Attends Religious Services: Never    Database administrator or Organizations: No    Attends Banker Meetings: Never    Marital Status: Married     Family History: The patient's Family history is unknown by patient.  ROS:   Please see the history of present illness.     All other systems reviewed and are negative.  EKGs/Labs/Other Studies Reviewed:    The following studies were reviewed today:  Cardiac CTA 05/2021  IMPRESSION: 1. Normal coronary calcium score of 0. Patient is low risk for coronary events.   2. Normal coronary origin with right dominance.   3. No evidence of CAD.   4. CAD-RADS 0. Consider non-atherosclerotic causes of chest pain.   Electronically Signed: By: Debbe Odea M.D. On: 05/21/2021 17:47  Echo 08/2022 1. Left ventricular ejection fraction, by estimation, is 55 to 60%. The  left ventricle has normal function. The left  ventricle has no regional  wall motion abnormalities. The left ventricular internal cavity size was  mildly dilated. Left ventricular  diastolic parameters are consistent with Grade I diastolic dysfunction  (impaired relaxation).   2. Right ventricular systolic function is normal. The right ventricular  size is normal. Tricuspid regurgitation signal is inadequate for assessing  PA pressure.   3. The mitral valve is normal in structure. No evidence of mitral valve  regurgitation. No evidence of mitral stenosis.   4. The aortic valve is tricuspid. Aortic valve regurgitation is not  visualized. Aortic valve sclerosis is present, with no evidence of aortic  valve stenosis.   5. The inferior vena cava is normal in size with greater than 50%  respiratory variability, suggesting right atrial pressure of 3 mmHg.   Heart monitor 08/2022 Patch Wear Time:  13 days  and 20 hours (2023-10-07T11:00:16-0400 to 2023-10-21T07:52:30-0400)   Patient had a min HR of 52 bpm, max HR of 136 bpm, and avg HR of 69 bpm. Predominant underlying rhythm was Sinus Rhythm. First Degree AV Block was present. 1 run of Supraventricular Tachycardia occurred lasting 4 beats with a max rate of 136 bpm (avg 123  bpm).  Rare PACs and rare PVCs. Most triggered events did not correlate with arrhythmia.    EKG:  EKG is not ordered today.    Recent Labs: 04/04/2022: ALT 21; BUN 11; Creatinine, Ser 0.96; Potassium 3.7; Sodium 132 07/28/2022: Hemoglobin 14.9; Platelets 295  Recent Lipid Panel    Component Value Date/Time   CHOL 122 11/18/2021 1105   TRIG 198 (H) 11/18/2021 1105   HDL 38 (L) 11/18/2021 1105   CHOLHDL 3.2 11/18/2021 1105   LDLCALC 52 11/18/2021 1105      Physical Exam:    VS:  BP 118/68 (BP Location: Left Arm, Patient Position: Sitting, Cuff Size: Large)   Pulse 67   Ht 5\' 4"  (1.626 m)   Wt 192 lb 9.6 oz (87.4 kg)   SpO2 98%   BMI 33.06 kg/m     Wt Readings from Last 3 Encounters:  09/14/22 192  lb 9.6 oz (87.4 kg)  07/28/22 192 lb 8 oz (87.3 kg)  07/15/22 194 lb (88 kg)     GEN:  Well nourished, well developed in no acute distress HEENT: Normal NECK: No JVD; No carotid bruits LYMPHATICS: No lymphadenopathy CARDIAC: RRR, no murmurs, rubs, gallops RESPIRATORY:  Clear to auscultation without rales, wheezing or rhonchi  ABDOMEN: Soft, non-tender, non-distended MUSCULOSKELETAL:  No edema; No deformity  SKIN: Warm and dry NEUROLOGIC:  Alert and oriented x 3 PSYCHIATRIC:  Normal affect   ASSESSMENT:    1. Chest pain, unspecified type   2. Diastolic dysfunction   3. Palpitations   4. Essential hypertension   5. Hyperlipidemia, mixed    PLAN:    In order of problems listed above:  Chronic atypical chest pain Prior cardiac CTA in 2022 showed calcium score of 0.  Echo showed normal LVEF with grade 1 diastolic dysfunction.  Patient reports no exertional chest pain or shortness of breath.  He does report a cold sensation on the left side of his chest that occurs occasionally.  No further workup at this time.  Continue Coreg and Crestor.  Diastolic dysfunction Echo showed normal LVEF with grade 1 diastolic dysfunction.  Patient appears euvolemic on exam.  We discussed low-salt diet, compression socks, lower leg edema.  Continue lisinopril-hydrochlorothiazide 20-12.5 mg daily, Jardiance 25 mg daily, Coreg 25 mg twice daily.  Palpitations and tachycardia Heart monitor showed no significant arrhythmias.  Patient reports occasional palpitations that do not interfere with his daily life.  Continue Coreg 25 mg twice daily.  No further workup at this time.  HTN Blood pressure is good today, continue Coreg, lisinopril-hydrochlorothiazide.  HLD Most recent lipid panel showed LDL of 52.  Continue Crestor 20 mg daily  Disposition: Follow up in 1 year(s) with MD/APP   Signed, Anmol Paschen 2023, PA-C  09/14/2022 12:30 PM    [facility]

## 2022-09-14 ENCOUNTER — Encounter: Payer: Self-pay | Admitting: Medical

## 2022-09-14 ENCOUNTER — Ambulatory Visit: Payer: Medicare HMO | Attending: Medical | Admitting: Medical

## 2022-09-14 VITALS — BP 118/68 | HR 67 | Ht 64.0 in | Wt 192.6 lb

## 2022-09-14 DIAGNOSIS — R002 Palpitations: Secondary | ICD-10-CM | POA: Diagnosis not present

## 2022-09-14 DIAGNOSIS — I10 Essential (primary) hypertension: Secondary | ICD-10-CM

## 2022-09-14 DIAGNOSIS — E782 Mixed hyperlipidemia: Secondary | ICD-10-CM | POA: Diagnosis not present

## 2022-09-14 DIAGNOSIS — R079 Chest pain, unspecified: Secondary | ICD-10-CM | POA: Diagnosis not present

## 2022-09-14 DIAGNOSIS — I5189 Other ill-defined heart diseases: Secondary | ICD-10-CM | POA: Diagnosis not present

## 2022-09-14 IMAGING — CT CT HEART MORP W/ CTA COR W/ SCORE W/ CA W/CM &/OR W/O CM
1 of 13 series · 4 of 20 positions shown, 5 images · non-contrast
Comparison: None.

Addendum:
CLINICAL DATA: Chest pain

EXAM:
Cardiac/Coronary  CTA
TECHNIQUE: The patient was scanned on a Siemens Somatom go.Top scanner.

[Series 26: multiphase % cta coronary 0.60 · axial · 0.39mm/px · z∈[-1075,-1008]mm · 4 of 3348 slices shown, 5 images]
[im 670/3348  vessel]
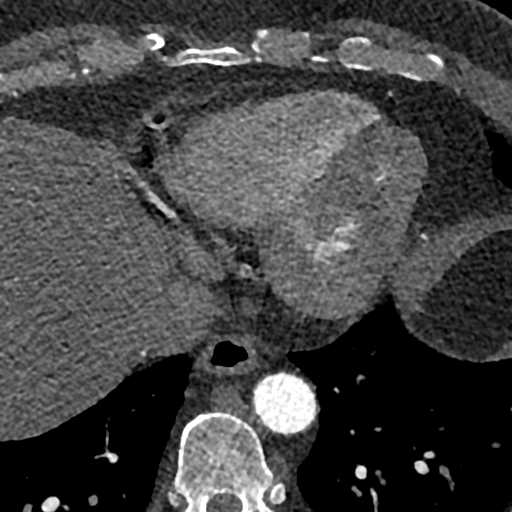
[im 670/3348  lung]
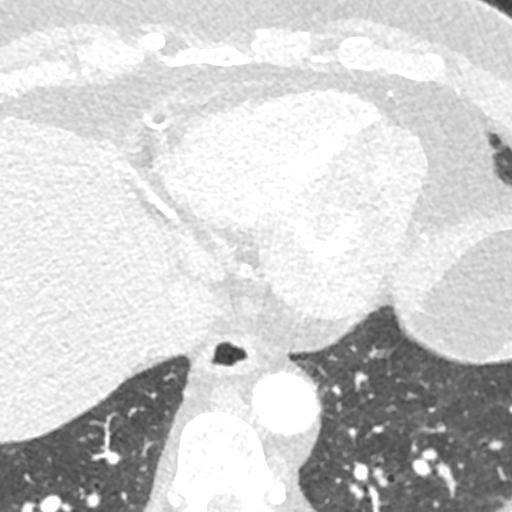
[im 1339/3348  vessel]
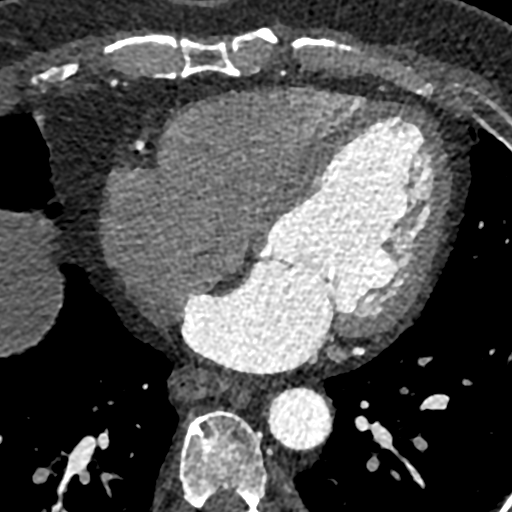
[im 2009/3348  vessel]
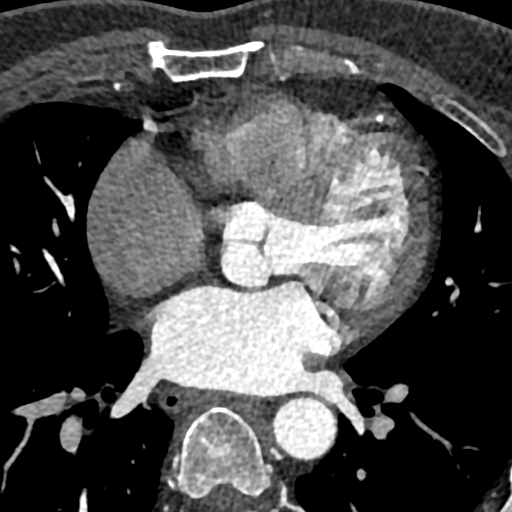
[im 2678/3348  vessel]
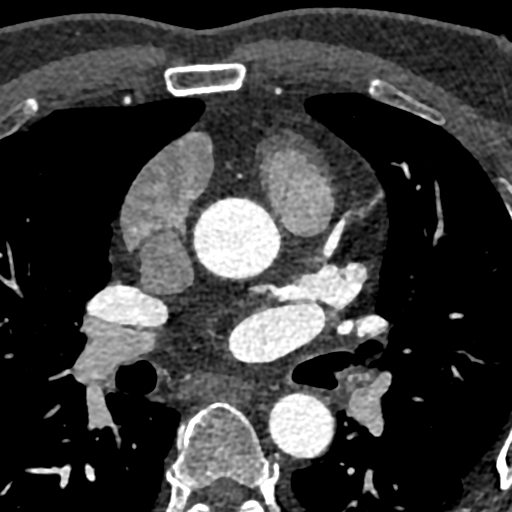

[4 of 20 positions shown; findings below may reference images not displayed]

:
A retrospective scan was triggered in the descending thoracic aorta.
Axial non-contrast 3 mm slices were carried out through the heart.
The data set was analyzed on a dedicated work station and scored
using the Agatson method. Gantry rotation speed was 330 msecs and
collimation was .6 mm. 25mg of coreg, 5mg iv metoprolol and 0.8 mg
of sl NTG was given. The 3D data set was reconstructed in 5%
intervals of the 60-95 % of the R-R cycle. Diastolic phases were
analyzed on a dedicated work station using MPR, MIP and VRT modes.
The patient received 100 cc of contrast.
FINDINGS: Aorta: Normal size. Mild aortic root and arch calcifications. No
dissection.

Aortic Valve:  Trileaflet.  No calcifications.

Coronary Arteries:  Normal coronary origin.  Right dominance.

RCA is a dominant artery that gives rise to PDA and PLA. There is no
plaque.

Left main gives rise to LAD and LCX arteries. There is no disease in
the LM.

LAD has no plaque.

LCX is a non-dominant artery that gives rise to two obtuse marginal
branches. There is no plaque.

Other findings:

Normal pulmonary vein drainage into the left atrium.

Normal left atrial appendage without a thrombus.

Normal size of the pulmonary artery.
IMPRESSION: 1. Normal coronary calcium score of 0. Patient is low risk for
coronary events.

2. Normal coronary origin with right dominance.

3. No evidence of CAD.

4. CAD-RADS 0. Consider non-atherosclerotic causes of chest pain.

EXAM:
OVER-READ INTERPRETATION  CT CHEST

The following report is an over-read performed by radiologist Dr.
over-read does not include interpretation of cardiac or coronary
anatomy or pathology. The coronary CTA interpretation by the
cardiologist is attached.
FINDINGS: No mediastinal mass or adenopathy identified.

No pleural effusion, airspace consolidation, or atelectasis within
the imaged portions of the upper abdomen. No suspicious pulmonary
nodule or mass.

No acute findings within the limited imaged portions of the upper
abdomen.

No acute or suspicious osseous findings.
IMPRESSION: Negative over-read.

*** End of Addendum ***
:
A retrospective scan was triggered in the descending thoracic aorta.
Axial non-contrast 3 mm slices were carried out through the heart.
The data set was analyzed on a dedicated work station and scored
using the Agatson method. Gantry rotation speed was 330 msecs and
collimation was .6 mm. 25mg of coreg, 5mg iv metoprolol and 0.8 mg
of sl NTG was given. The 3D data set was reconstructed in 5%
intervals of the 60-95 % of the R-R cycle. Diastolic phases were
analyzed on a dedicated work station using MPR, MIP and VRT modes.
The patient received 100 cc of contrast.
FINDINGS: Aorta: Normal size. Mild aortic root and arch calcifications. No
dissection.

Aortic Valve:  Trileaflet.  No calcifications.

Coronary Arteries:  Normal coronary origin.  Right dominance.

RCA is a dominant artery that gives rise to PDA and PLA. There is no
plaque.

Left main gives rise to LAD and LCX arteries. There is no disease in
the LM.

LAD has no plaque.

LCX is a non-dominant artery that gives rise to two obtuse marginal
branches. There is no plaque.

Other findings:

Normal pulmonary vein drainage into the left atrium.

Normal left atrial appendage without a thrombus.

Normal size of the pulmonary artery.
IMPRESSION: 1. Normal coronary calcium score of 0. Patient is low risk for
coronary events.

2. Normal coronary origin with right dominance.

3. No evidence of CAD.

4. CAD-RADS 0. Consider non-atherosclerotic causes of chest pain.

## 2022-09-14 NOTE — Patient Instructions (Signed)
Medication Instructions:  - Your physician recommends that you continue on your current medications as directed. Please refer to the Current Medication list given to you today.  *If you need a refill on your cardiac medications before your next appointment, please call your pharmacy*   Lab Work: - none ordered  If you have labs (blood work) drawn today and your tests are completely normal, you will receive your results only by: MyChart Message (if you have MyChart) OR A paper copy in the mail If you have any lab test that is abnormal or we need to change your treatment, we will call you to review the results.   Testing/Procedures: - none ordered   Follow-Up: At [facility], you and your health needs are our priority.  As part of our continuing mission to provide you with exceptional heart care, we have created designated Provider Care Teams.  These Care Teams include your primary Cardiologist (physician) and Advanced Practice Providers (APPs -  Physician Assistants and Nurse Practitioners) who all work together to provide you with the care you need, when you need it.  We recommend signing up for the patient portal called "MyChart".  Sign up information is provided on this After Visit Summary.  MyChart is used to connect with patients for Virtual Visits (Telemedicine).  Patients are able to view lab/test results, encounter notes, upcoming appointments, etc.  Non-urgent messages can be sent to your provider as well.   To learn more about what you can do with MyChart, go to [email or web address].    Your next appointment:   1 year(s)  The format for your next appointment:   In Person  Provider:   You may see Muhammad Arida, MD or one of the following Advanced Practice Providers on your designated Care Team:    Cadence Furth, PA-C   Other Instructions N/a  Important Information About Sugar       

## 2022-09-20 ENCOUNTER — Other Ambulatory Visit: Payer: Self-pay | Admitting: Nurse Practitioner

## 2022-09-20 ENCOUNTER — Other Ambulatory Visit: Payer: Self-pay | Admitting: Physician Assistant

## 2022-09-20 DIAGNOSIS — Z8639 Personal history of other endocrine, nutritional and metabolic disease: Secondary | ICD-10-CM

## 2022-09-20 DIAGNOSIS — D649 Anemia, unspecified: Secondary | ICD-10-CM

## 2022-09-20 DIAGNOSIS — E119 Type 2 diabetes mellitus without complications: Secondary | ICD-10-CM

## 2022-09-20 NOTE — Telephone Encounter (Signed)
Requested Prescriptions  Pending Prescriptions Disp Refills   gabapentin (NEURONTIN) 300 MG capsule [Pharmacy Med Name: GABAPENTIN 300 MG CAPSULE] 90 capsule 0    Sig: Take 1 capsule (300 mg total) by mouth 3 (three) times daily.     Neurology: Anticonvulsants - gabapentin Passed - 09/20/2022  9:45 AM      Passed - Cr in normal range and within 360 days    Creatinine  Date Value Ref Range Status  01/10/2014 0.82 0.60 - 1.30 mg/dL Final   Creatinine, Ser  Date Value Ref Range Status  04/04/2022 0.96 0.61 - 1.24 mg/dL Final         Passed - Completed PHQ-2 or PHQ-9 in the last 360 days      Passed - Valid encounter within last 12 months    Recent Outpatient Visits           2 months ago Primary hypertension   [name] Mecum, [name], PA-C   6 months ago Primary hypertension   [name] Mecum, [name], PA-C   8 months ago Primary hypertension   [name] Vigg, Avanti, MD   10 months ago Anemia, unspecified type   [facility] Vigg, Avanti, MD   11 months ago Anemia, unspecified type   [facility] Vigg, Avanti, MD       Future Appointments             In 3 days [name], NP [name], [facility]             rosuvastatin (CRESTOR) 20 MG tablet [Pharmacy Med Name: ROSUVASTATIN CALCIUM 20 MG TAB] 90 tablet 0    Sig: Take 1 tablet (20 mg total) by mouth daily.     Cardiovascular:  Antilipid - Statins 2 Failed - 09/20/2022  9:45 AM      Failed - Lipid Panel in normal range within the last 12 months    Cholesterol, Total  Date Value Ref Range Status  11/18/2021 122 100 - 199 mg/dL Final   LDL Chol Calc (NIH)  Date Value Ref Range Status  11/18/2021 52 0 - 99 mg/dL Final   HDL  Date Value Ref Range Status  11/18/2021 38 (L) >39 mg/dL Final   Triglycerides  Date Value Ref Range Status  11/18/2021 198 (H) 0 - 149 mg/dL Final         Passed - Cr in normal range and within 360  days    Creatinine  Date Value Ref Range Status  01/10/2014 0.82 0.60 - 1.30 mg/dL Final   Creatinine, Ser  Date Value Ref Range Status  04/04/2022 0.96 0.61 - 1.24 mg/dL Final         Passed - Patient is not pregnant      Passed - Valid encounter within last 12 months    Recent Outpatient Visits           2 months ago Primary hypertension   [name] Mecum, [name], PA-C   6 months ago Primary hypertension   [name] Mecum, [name], PA-C   8 months ago Primary hypertension   [name] Vigg, Avanti, MD   10 months ago Anemia, unspecified type   [facility] Vigg, Avanti, MD   11 months ago Anemia, unspecified type   [name] Vigg, Avanti, MD       Future Appointments  In 3 days Jon Billings, NP [name]             Vitamin D, Ergocalciferol, (DRISDOL) 1.25 MG (50000 UNIT) CAPS capsule [Pharmacy Med Name: VITAMIN D2 1.25MG (50,000 UNIT)] 12 capsule 0    Sig: TAKE 1 CAPSULE 1 TIME A WEEK     Endocrinology:  Vitamins - Vitamin D Supplementation 2 Failed - 09/20/2022  9:45 AM      Failed - Manual Review: Route requests for 50,000 IU strength to the provider      Passed - Ca in normal range and within 360 days    Calcium  Date Value Ref Range Status  04/04/2022 9.1 8.9 - 10.3 mg/dL Final   Calcium, Total  Date Value Ref Range Status  01/10/2014 8.5 8.5 - 10.1 mg/dL Final   Calcium, Ion  Date Value Ref Range Status  11/23/2014 1.19 1.12 - 1.23 mmol/L Final         Passed - Vitamin D in normal range and within 360 days    Vit D, 25-Hydroxy  Date Value Ref Range Status  06/24/2022 45.2 30.0 - 100.0 ng/mL Final    Comment:    Vitamin D deficiency has been defined by the Institute of Medicine and an Endocrine Society practice guideline as a level of serum 25-OH vitamin D less than 20 ng/mL (1,2). The Endocrine Society went on to further define vitamin  D insufficiency as a level between 21 and 29 ng/mL (2). 1. IOM (Institute of Medicine). 2010. Dietary reference    intakes for calcium and D. [place]: The    [employer]. 2. Holick MF, Binkley [place], Bischoff-Ferrari HA, et al.    Evaluation, treatment, and prevention of vitamin D    deficiency: an Endocrine Society clinical practice    guideline. JCEM. 2011 Jul; 96(7):1911-30.          Passed - Valid encounter within last 12 months    Recent Outpatient Visits           2 months ago Primary hypertension   [name] Mecum, Dani Gobble, PA-C   6 months ago Primary hypertension   [name] Mecum, Dani Gobble, PA-C   8 months ago Primary hypertension   [place] Vigg, Avanti, MD   10 months ago Anemia, unspecified type   [place], MD   11 months ago Anemia, unspecified type   [place], MD       Future Appointments             In 3 days Jon Billings, NP [facility], [place]

## 2022-09-20 NOTE — Telephone Encounter (Signed)
Requested medication (s) are due for refill today: yes  Requested medication (s) are on the active medication list: yes  Last refill:  06/24/22 #12/0  Future visit scheduled: yes  Notes to clinic:  Unable to refill per protocol, cannot delegate.    Requested Prescriptions  Pending Prescriptions Disp Refills   Vitamin D, Ergocalciferol, (DRISDOL) 1.25 MG (50000 UNIT) CAPS capsule [Pharmacy Med Name: VITAMIN D2 1.25MG (50,000 UNIT)] 12 capsule 0    Sig: TAKE 1 CAPSULE 1 TIME A WEEK     Endocrinology:  Vitamins - Vitamin D Supplementation 2 Failed - 09/20/2022  9:45 AM      Failed - Manual Review: Route requests for 50,000 IU strength to the provider      Passed - Ca in normal range and within 360 days    Calcium  Date Value Ref Range Status  04/04/2022 9.1 8.9 - 10.3 mg/dL Final   Calcium, Total  Date Value Ref Range Status  01/10/2014 8.5 8.5 - 10.1 mg/dL Final   Calcium, Ion  Date Value Ref Range Status  11/23/2014 1.19 1.12 - 1.23 mmol/L Final         Passed - Vitamin D in normal range and within 360 days    Vit D, 25-Hydroxy  Date Value Ref Range Status  06/24/2022 45.2 30.0 - 100.0 ng/mL Final    Comment:    Vitamin D deficiency has been defined by the Institute of Medicine and an Endocrine Society practice guideline as a level of serum 25-OH vitamin D less than 20 ng/mL (1,2). The Endocrine Society went on to further define vitamin D insufficiency as a level between 21 and 29 ng/mL (2). 1. IOM (Institute of Medicine). 2010. Dietary reference    intakes for calcium and D. [place]: The    [employer]. 2. Holick MF, Binkley [place], Bischoff-Ferrari HA, et al.    Evaluation, treatment, and prevention of vitamin D    deficiency: an Endocrine Society clinical practice    guideline. JCEM. 2011 Jul; 96(7):1911-30.          Passed - Valid encounter within last 12 months    Recent Outpatient Visits           2 months ago Primary hypertension    [name] Mecum, [name], PA-C   6 months ago Primary hypertension   [name] Mecum, [name], PA-C   8 months ago Primary hypertension   [name] Vigg, Avanti, MD   10 months ago Anemia, unspecified type   [place] Vigg, Avanti, MD   11 months ago Anemia, unspecified type   [facility] Vigg, Avanti, MD       Future Appointments             In 3 days [name], NP [name]            Signed Prescriptions Disp Refills   gabapentin (NEURONTIN) 300 MG capsule 270 capsule 0    Sig: Take 1 capsule (300 mg total) by mouth 3 (three) times daily.     Neurology: Anticonvulsants - gabapentin Passed - 09/20/2022  9:45 AM      Passed - Cr in normal range and within 360 days    Creatinine  Date Value Ref Range Status  01/10/2014 0.82 0.60 - 1.30 mg/dL Final   Creatinine, Ser  Date Value Ref Range Status  04/04/2022 0.96 0.61 - 1.24 mg/dL Final  Passed - Completed PHQ-2 or PHQ-9 in the last 360 days      Passed - Valid encounter within last 12 months    Recent Outpatient Visits           2 months ago Primary hypertension   [name] Mecum, Dani Gobble, PA-C   6 months ago Primary hypertension   [name] Mecum, Dani Gobble, PA-C   8 months ago Primary hypertension   [place] Vigg, Avanti, MD   10 months ago Anemia, unspecified type   [place] Vigg, Avanti, MD   11 months ago Anemia, unspecified type   [place], MD       Future Appointments             In 3 days Jon Billings, NP [name]             rosuvastatin (CRESTOR) 20 MG tablet 90 tablet 0    Sig: Take 1 tablet (20 mg total) by mouth daily.     Cardiovascular:  Antilipid - Statins 2 Failed - 09/20/2022  9:45 AM      Failed - Lipid Panel in normal range within the last 12 months     Cholesterol, Total  Date Value Ref Range Status  11/18/2021 122 100 - 199 mg/dL Final   LDL Chol Calc (NIH)  Date Value Ref Range Status  11/18/2021 52 0 - 99 mg/dL Final   HDL  Date Value Ref Range Status  11/18/2021 38 (L) >39 mg/dL Final   Triglycerides  Date Value Ref Range Status  11/18/2021 198 (H) 0 - 149 mg/dL Final         Passed - Cr in normal range and within 360 days    Creatinine  Date Value Ref Range Status  01/10/2014 0.82 0.60 - 1.30 mg/dL Final   Creatinine, Ser  Date Value Ref Range Status  04/04/2022 0.96 0.61 - 1.24 mg/dL Final         Passed - Patient is not pregnant      Passed - Valid encounter within last 12 months    Recent Outpatient Visits           2 months ago Primary hypertension   [name] Mecum, Dani Gobble, PA-C   6 months ago Primary hypertension   [name] Mecum, Dani Gobble, PA-C   8 months ago Primary hypertension   [name] Vigg, Avanti, MD   10 months ago Anemia, unspecified type   [facility] Vigg, Avanti, MD   11 months ago Anemia, unspecified type   [place] Vigg, Avanti, MD       Future Appointments             In 3 days Jon Billings, NP [facility], [place]

## 2022-09-21 NOTE — Telephone Encounter (Signed)
Requested medication (s) are due for refill today: yes  Requested medication (s) are on the active medication list: yes  Last refill:  06/24/22 #12 0 refills  Future visit scheduled: yes in 2 days   Notes to clinic:  manuel review . Do you want to refill Rx?     Requested Prescriptions  Pending Prescriptions Disp Refills   Vitamin D, Ergocalciferol, (DRISDOL) 1.25 MG (50000 UNIT) CAPS capsule [Pharmacy Med Name: VITAMIN D2 1.25MG (50,000 UNIT)] 12 capsule 0    Sig: TAKE 1 CAPSULE 1TIME A WEEK     Endocrinology:  Vitamins - Vitamin D Supplementation 2 Failed - 09/20/2022  4:43 PM      Failed - Manual Review: Route requests for 50,000 IU strength to the provider      Passed - Ca in normal range and within 360 days    Calcium  Date Value Ref Range Status  04/04/2022 9.1 8.9 - 10.3 mg/dL Final   Calcium, Total  Date Value Ref Range Status  01/10/2014 8.5 8.5 - 10.1 mg/dL Final   Calcium, Ion  Date Value Ref Range Status  11/23/2014 1.19 1.12 - 1.23 mmol/L Final         Passed - Vitamin D in normal range and within 360 days    Vit D, 25-Hydroxy  Date Value Ref Range Status  06/24/2022 45.2 30.0 - 100.0 ng/mL Final    Comment:    Vitamin D deficiency has been defined by the Institute of Medicine and an Endocrine Society practice guideline as a level of serum 25-OH vitamin D less than 20 ng/mL (1,2). The Endocrine Society went on to further define vitamin D insufficiency as a level between 21 and 29 ng/mL (2). 1. IOM (Institute of Medicine). 2010. Dietary reference    intakes for calcium and D. [place]: The    [employer]. 2. Holick MF, Binkley [place], Bischoff-Ferrari HA, et al.    Evaluation, treatment, and prevention of vitamin D    deficiency: an Endocrine Society clinical practice    guideline. JCEM. 2011 Jul; 96(7):1911-30.          Passed - Valid encounter within last 12 months    Recent Outpatient Visits           2 months ago Primary  hypertension   [name] Mecum, [name], PA-C   6 months ago Primary hypertension   [name] Mecum, [name], PA-C   8 months ago Primary hypertension   [name] Vigg, Avanti, MD   10 months ago Anemia, unspecified type   [facility] Vigg, Avanti, MD   11 months ago Anemia, unspecified type   [name] Vigg, Avanti, MD       Future Appointments             In 2 days [name], NP [facility]

## 2022-09-22 NOTE — Progress Notes (Unsigned)
There were no vitals taken for this visit.   Subjective:    Patient ID: Shane Preston, male    DOB: 1961-03-28, 61 y.o.   MRN: [id number]  HPI: Shane Preston is a 61 y.o. male presenting on 09/23/2022 for comprehensive medical examination. Current medical complaints include:{Blank single:19197::"none","***"}  He currently lives with: Interim Problems from his last visit: {Blank single:19197::"yes","no"}  HYPERTENSION / HYPERLIPIDEMIA Satisfied with current treatment? {Blank single:19197::"yes","no"} Duration of hypertension: {Blank single:19197::"chronic","months","years"} BP monitoring frequency: {Blank single:19197::"not checking","rarely","daily","weekly","monthly","a few times a day","a few times a week","a few times a month"} BP range:  BP medication side effects: {Blank single:19197::"yes","no"} Past BP meds: {Blank multiple:19196::"none","amlodipine","amlodipine/benazepril","atenolol","benazepril","benazepril/HCTZ","bisoprolol (bystolic)","carvedilol","chlorthalidone","clonidine","diltiazem","exforge HCT","HCTZ","irbesartan (avapro)","labetalol","lisinopril","lisinopril-HCTZ","losartan (cozaar)","methyldopa","nifedipine","olmesartan (benicar)","olmesartan-HCTZ","quinapril","ramipril","spironalactone","tekturna","valsartan","valsartan-HCTZ","verapamil"} Duration of hyperlipidemia: {Blank single:19197::"chronic","months","years"} Cholesterol medication side effects: {Blank single:19197::"yes","no"} Cholesterol supplements: {Blank multiple:19196::"none","fish oil","niacin","red yeast rice"} Past cholesterol medications: {Blank multiple:19196::"none","atorvastain (lipitor)","lovastatin (mevacor)","pravastatin (pravachol)","rosuvastatin (crestor)","simvastatin (zocor)","vytorin","fenofibrate (tricor)","gemfibrozil","ezetimide (zetia)","niaspan","lovaza"} Medication compliance: {Blank single:19197::"excellent compliance","good compliance","fair compliance","poor  compliance"} Aspirin: {Blank single:19197::"yes","no"} Recent stressors: {Blank single:19197::"yes","no"} Recurrent headaches: {Blank single:19197::"yes","no"} Visual changes: {Blank single:19197::"yes","no"} Palpitations: {Blank single:19197::"yes","no"} Dyspnea: {Blank single:19197::"yes","no"} Chest pain: {Blank single:19197::"yes","no"} Lower extremity edema: {Blank single:19197::"yes","no"} Dizzy/lightheaded: {Blank single:19197::"yes","no"}  DIABETES Hypoglycemic episodes:{Blank single:19197::"yes","no"} Polydipsia/polyuria: {Blank single:19197::"yes","no"} Visual disturbance: {Blank single:19197::"yes","no"} Chest pain: {Blank single:19197::"yes","no"} Paresthesias: {Blank single:19197::"yes","no"} Glucose Monitoring: {Blank single:19197::"yes","no"}  Accucheck frequency: {Blank single:19197::"Not Checking","Daily","BID","TID"}  Fasting glucose:  Post prandial:  Evening:  Before meals: Taking Insulin?: {Blank single:19197::"yes","no"}  Long acting insulin:  Short acting insulin: Blood Pressure Monitoring: {Blank single:19197::"not checking","rarely","daily","weekly","monthly","a few times a day","a few times a week","a few times a month"} Retinal Examination: {Blank single:19197::"Up to Date","Not up to Date"} Foot Exam: {Blank single:19197::"Up to Date","Not up to Date"} Diabetic Education: {Blank single:19197::"Completed","Not Completed"} Pneumovax: {Blank single:19197::"Up to Date","Not up to Date","unknown"} Influenza: {Blank single:19197::"Up to Date","Not up to Date","unknown"} Aspirin: {Blank single:19197::"yes","no"}  ANEMIA Anemia status: {Blank single:19197::"controlled","uncontrolled","better","worse","exacerbated","stable"} Etiology of anemia: Duration of anemia treatment:  Compliance with treatment: {Blank single:19197::"excellent compliance","good compliance","fair compliance","poor compliance"} Iron supplementation side effects: {Blank  single:19197::"yes","no"} Severity of anemia: {Blank single:19197::"mild","moderate","severe"} Fatigue: {Blank single:19197::"yes","no"} Decreased exercise tolerance: {Blank single:19197::"yes","no"}  Dyspnea on exertion: {Blank single:19197::"yes","no"} Palpitations: {Blank single:19197::"yes","no"} Bleeding: {Blank single:19197::"yes","no"} Pica: {Blank single:19197::"yes","no"}  HYPOTHYROIDISM Thyroid control status:{Blank single:19197::"controlled","uncontrolled","better","worse","exacerbated","stable"} Satisfied with current treatment? {Blank single:19197::"yes","no"} Medication side effects: {Blank single:19197::"yes","no"} Medication compliance: {Blank single:19197::"excellent compliance","good compliance","fair compliance","poor compliance"} Etiology of hypothyroidism:  Recent dose adjustment:{Blank single:19197::"yes","no"} Fatigue: {Blank single:19197::"yes","no"} Cold intolerance: {Blank single:19197::"yes","no"} Heat intolerance: {Blank single:19197::"yes","no"} Weight gain: {Blank single:19197::"yes","no"} Weight loss: {Blank single:19197::"yes","no"} Constipation: {Blank single:19197::"yes","no"} Diarrhea/loose stools: {Blank single:19197::"yes","no"} Palpitations: {Blank single:19197::"yes","no"} Lower extremity edema: {Blank single:19197::"yes","no"} Anxiety/depressed mood: {Blank single:19197::"yes","no"}  Functional Status Survey:    FALL RISK:    06/24/2022   10:49 AM 03/15/2022   10:19 AM 11/18/2021   10:41 AM 09/01/2021    8:20 AM 07/21/2021   10:22 AM  Fall Risk   Falls in the past year? 0 0 0 0 0  Number falls in past yr: 0 0 0 0 0  Injury with Fall? 0 0 0 0 0  Risk for fall due to : No Fall Risks No Fall Risks No Fall Risks  No Fall Risks  Follow up Falls evaluation completed Falls evaluation completed Falls evaluation completed Falls evaluation completed Falls evaluation completed    Depression Screen    06/24/2022   10:49 AM 03/15/2022   10:19 AM  11/18/2021   10:42 AM 09/01/2021    8:32 AM 07/21/2021   10:22 AM  Depression screen PHQ 2/9  Decreased Interest 0 0 0 0 0  Down, Depressed, Hopeless 0 0 0 0 0  PHQ - 2 Score 0 0 0 0 0  Altered sleeping 0 0 0    Tired, decreased energy 0 0 0    Change in appetite 0 0 0    Feeling bad or failure about yourself  0 0 0    Trouble concentrating 0 0 0    Moving slowly or fidgety/restless 0 0 0    Suicidal thoughts 0 0 0    PHQ-9 Score 0 0 0    Difficult doing work/chores Not difficult at all Not difficult at all Not difficult at all      Advanced Directives <no information>  Past Medical History:  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   Lumbar stenosis    Neuropathy    Peripheral vascular disease (HCC)    TIA (transient ischemic attack)    Tinea pedis     Surgical History:  Past Surgical History:  Procedure Laterality Date   BIOPSY N/A 04/20/2022   Procedure: BIOPSY;  Surgeon: Toney Reil, MD;  Location: [facility];  Service: Endoscopy;  Laterality: N/A;   CARDIAC CATHETERIZATION     [facility]    COLONOSCOPY WITH PROPOFOL N/A 10/30/2021   Procedure: COLONOSCOPY WITH PROPOFOL;  Surgeon: Toney Reil, MD;  Location: [facility];  Service: Gastroenterology;  Laterality: N/A;   COLONOSCOPY WITH PROPOFOL N/A 04/20/2022   Procedure: COLONOSCOPY WITH PROPOFOL;  Surgeon: Toney Reil, MD;  Location: [facility];  Service: Endoscopy;  Laterality: N/A;   ESOPHAGOGASTRODUODENOSCOPY (EGD) WITH PROPOFOL N/A 04/20/2022   Procedure: ESOPHAGOGASTRODUODENOSCOPY (EGD) WITH PROPOFOL;  Surgeon: Toney Reil, MD;  Location: [facility];  Service: Endoscopy;  Laterality: N/A;   EYE SURGERY     HERNIA REPAIR  10/11/2005   lumbar decompression surgery   10/11/2008    Medications:  Current Outpatient Medications on File Prior to Visit  Medication Sig   ACCU-CHEK AVIVA PLUS test strip USE 1 THREE TIMES A DAY   Accu-Chek Softclix Lancets lancets USE 1 LANCET THREE TIMES A DAY   carvedilol (COREG) 25 MG tablet Take 1 tablet (25 mg total) by mouth 2 (two) times daily.   cyanocobalamin (VITAMIN B12) 1000 MCG tablet Take 1,000 mcg by mouth daily.   ferrous sulfate 324 (65 Fe) MG TBEC Take 325 mg by mouth in the morning and at bedtime. (Patient not taking: Reported on 07/28/2022)   gabapentin (NEURONTIN) 300 MG capsule Take 1 capsule (300 mg total) by mouth 3 (three) times daily.   JARDIANCE 25 MG TABS tablet Take 1 tablet (25 mg total) by mouth daily.   levothyroxine (SYNTHROID) 25 MCG tablet Take 1 tablet (25 mcg total) by mouth every morning.   lisinopril-hydrochlorothiazide (ZESTORETIC) 20-12.5 MG tablet Take 1 tablet by mouth daily.   Magnesium Oxide -Mg Supplement 250 MG TABS Take 1 tablet by mouth daily. (Patient not taking: Reported on 07/28/2022)   metFORMIN (GLUCOPHAGE) 1000 MG tablet Take 1 tablet (1,000 mg total) by mouth 2 (two) times daily.   nitroGLYCERIN (NITROSTAT) 0.4 MG SL tablet Place 0.4 mg under the tongue every 5 (five) minutes as needed for chest pain. (Patient not taking: Reported on 07/28/2022)   omeprazole (PRILOSEC) 20 MG capsule Take 1 capsule (20 mg total) by mouth 2 (two) times daily before  a meal for 14 days.   rosuvastatin (CRESTOR) 20 MG tablet Take 1 tablet (20 mg total) by mouth daily.   Semaglutide, 1 MG/DOSE, (OZEMPIC, 1 MG/DOSE,) 4 MG/3ML SOPN Inject 1 mg into the muscle once a week.   Vitamin D, Ergocalciferol, (DRISDOL) 1.25 MG (50000 UNIT) CAPS capsule TAKE 1 CAPSULE 1 TIME A WEEK   No current facility-administered medications on file prior to visit.    Allergies:  No Known Allergies  Social History:  Social History   Socioeconomic History   Marital status: Married    Spouse name: Not on file    Number of children: Not on file   Years of education: Not on file   Highest education level: Not on file  Occupational History   Not on file  Tobacco Use   Smoking status: Never   Smokeless tobacco: Never  Vaping Use   Vaping Use: Never used  Substance and Sexual Activity   Alcohol use: Not Currently   Drug use: Not Currently   Sexual activity: Not Currently  Other Topics Concern   Not on file  Social History Narrative   ** Merged History Encounter **       Social Determinants of Health   Financial Resource Strain: Low Risk  (09/01/2021)   Overall Financial Resource Strain (CARDIA)    Difficulty of Paying Living Expenses: Not hard at all  Food Insecurity: No Food Insecurity (09/01/2021)   Hunger Vital Sign    Worried About Running Out of Food in the Last Year: Never true    Ran Out of Food in the Last Year: Never true  Transportation Needs: No Transportation Needs (09/01/2021)   PRAPARE - Administrator, Civil Service (Medical): No    Lack of Transportation (Non-Medical): No  Physical Activity: Inactive (09/01/2021)   Exercise Vital Sign    Days of Exercise per Week: 0 days    Minutes of Exercise per Session: 0 min  Stress: No Stress Concern Present (09/01/2021)   [employer] of Occupational Health - Occupational Stress Questionnaire    Feeling of Stress : Not at all  Social Connections: Moderately Isolated (09/01/2021)   Social Connection and Isolation Panel [NHANES]    Frequency of Communication with Friends and Family: More than three times a week    Frequency of Social Gatherings with Friends and Family: More than three times a week    Attends Religious Services: Never    Database administrator or Organizations: No    Attends Banker Meetings: Never    Marital Status: Married  Catering manager Violence: Not At Risk (09/01/2021)   Humiliation, Afraid, Rape, and Kick questionnaire    Fear of Current or Ex-Partner: No    Emotionally  Abused: No    Physically Abused: No    Sexually Abused: No   Social History   Tobacco Use  Smoking Status Never  Smokeless Tobacco Never   Social History   Substance and Sexual Activity  Alcohol Use Not Currently    Family History:  Family History  Family history unknown: Yes    Past medical history, surgical history, medications, allergies, family history and social history reviewed with patient today and changes made to appropriate areas of the chart.   ROS All other ROS negative except what is listed above and in the HPI.      Objective:    There were no vitals taken for this visit.  Wt Readings from Last 3 Encounters:  09/14/22 192 lb 9.6 oz (87.4 kg)  07/28/22 192 lb 8 oz (87.3 kg)  07/15/22 194 lb (88 kg)    No results found.  Physical Exam      No data to display          Cognitive Testing - 6-CIT  Correct? Score   What year is it? {YES NO:22349} {Numbers; 0-4:31231} Yes = 0    No = 4  What month is it? {YES NO:22349} {Numbers; 0-4:31231} Yes = 0    No = 3  Remember:     Floyde Parkins, [address], [place]     What time is it? {YES NO:22349} {Numbers; 0-4:31231} Yes = 0    No = 3  Count backwards from 20 to 1 {YES NO:22349} {Numbers; 0-4:31231} Correct = 0    1 error = 2   More than 1 error = 4  Say the months of the year in reverse. {YES NO:22349} {Numbers; 0-4:31231} Correct = 0    1 error = 2   More than 1 error = 4  What address did I ask you to remember? {YES NO:22349} {NUMBERS; 0-10:5044} Correct = 0  1 error = 2    2 error = 4    3 error = 6    4 error = 8    All wrong = 10       TOTAL SCORE  {Numbers; 4-23:53614}/43   Interpretation:  {Desc; normal/abnormal:11317::"Normal"}  Normal (0-7) Abnormal (8-28)    Results for orders placed or performed in visit on 08/31/22  ECHOCARDIOGRAM COMPLETE  Result Value Ref Range   AR max vel 4.07 cm2   AV Peak grad 5.2 mmHg   Ao pk vel 1.14 m/s   S' Lateral 3.90 cm   Area-P 1/2 3.23 cm2   AV Area  VTI 3.99 cm2   AV Mean grad 3.0 mmHg   Single Plane A4C EF 61.6 %   Single Plane A2C EF 53.4 %   Calc EF 58.5 %   AV Area mean vel 3.75 cm2      Assessment & Plan:   Problem List Items Addressed This Visit       Cardiovascular and Mediastinum   HTN (hypertension) - Primary (Chronic)     Endocrine   Acquired hypothyroidism   Peripheral sensory neuropathy due to type 2 diabetes mellitus (HCC)   Diabetes mellitus without complication (HCC)     Other   Obesity- BMI 38 (Chronic)   Iron deficiency anemia (Chronic)   Mixed hyperlipidemia     Preventative Services:  Health Risk Assessment and Personalized Prevention Plan: Bone Mass Measurements: CVD Screening:  Colon Cancer Screening:  Depression Screening:  Diabetes Screening:  Glaucoma Screening:  Hepatitis B vaccine: Hepatitis C screening:  HIV Screening: Flu Vaccine: Lung cancer Screening: Obesity Screening:  Pneumonia Vaccines (2): STI Screening: PSA screening:  Discussed aspirin prophylaxis for myocardial infarction prevention and decision was {Blank single:19197::"it was not indicated","made to continue ASA","made to start ASA","made to stop ASA","that we recommended ASA, and patient refused"}  LABORATORY TESTING:  Health maintenance labs ordered today as discussed above.   The natural history of prostate cancer and ongoing controversy regarding screening and potential treatment outcomes of prostate cancer has been discussed with the patient. The meaning of a false positive PSA and a false negative PSA has been discussed. He indicates understanding of the limitations of this screening test and wishes *** to proceed with screening PSA testing.   IMMUNIZATIONS:   -  Tdap: Tetanus vaccination status reviewed: {tetanus status:315746}. - Influenza: {Blank single:19197::"Up to date","Administered today","Postponed to flu season","Refused","Given elsewhere"} - Pneumovax: {Blank single:19197::"Up to date","Administered  today","Not applicable","Refused","Given elsewhere"} - Prevnar: {Blank single:19197::"Up to date","Administered today","Not applicable","Refused","Given elsewhere"} - Zostavax vaccine: {Blank single:19197::"Up to date","Administered today","Not applicable","Refused","Given elsewhere"}  SCREENING: - Colonoscopy: {Blank single:19197::"Up to date","Ordered today","Not applicable","Refused","Done elsewhere"}  Discussed with patient purpose of the colonoscopy is to detect colon cancer at curable precancerous or early stages   - AAA Screening: {Blank single:19197::"Up to date","Ordered today","Not applicable","Refused","Done elsewhere"}  -Hearing Test: {Blank single:19197::"Up to date","Ordered today","Not applicable","Refused","Done elsewhere"}  -Spirometry: {Blank single:19197::"Up to date","Ordered today","Not applicable","Refused","Done elsewhere"}   PATIENT COUNSELING:    Sexuality: Discussed sexually transmitted diseases, partner selection, use of condoms, avoidance of unintended pregnancy  and contraceptive alternatives.   Advised to avoid cigarette smoking.  I discussed with the patient that most people either abstain from alcohol or drink within safe limits (<=14/week and <=4 drinks/occasion for males, <=7/weeks and <= 3 drinks/occasion for females) and that the risk for alcohol disorders and other health effects rises proportionally with the number of drinks per week and how often a drinker exceeds daily limits.  Discussed cessation/primary prevention of drug use and availability of treatment for abuse.   Diet: Encouraged to adjust caloric intake to maintain  or achieve ideal body weight, to reduce intake of dietary saturated fat and total fat, to limit sodium intake by avoiding high sodium foods and not adding table salt, and to maintain adequate dietary potassium and calcium preferably from fresh fruits, vegetables, and low-fat dairy products.    stressed the importance of regular  exercise  Injury prevention: Discussed safety belts, safety helmets, smoke detector, smoking near bedding or upholstery.   Dental health: Discussed importance of regular tooth brushing, flossing, and dental visits.   Follow up plan: NEXT PREVENTATIVE PHYSICAL DUE IN 1 YEAR. No follow-ups on file.

## 2022-09-23 ENCOUNTER — Encounter: Payer: Self-pay | Admitting: Nurse Practitioner

## 2022-09-23 ENCOUNTER — Ambulatory Visit (INDEPENDENT_AMBULATORY_CARE_PROVIDER_SITE_OTHER): Payer: Medicare HMO | Admitting: Nurse Practitioner

## 2022-09-23 VITALS — BP 136/72 | HR 66 | Temp 98.6°F | Wt 193.5 lb

## 2022-09-23 DIAGNOSIS — E119 Type 2 diabetes mellitus without complications: Secondary | ICD-10-CM

## 2022-09-23 DIAGNOSIS — Z7189 Other specified counseling: Secondary | ICD-10-CM | POA: Diagnosis not present

## 2022-09-23 DIAGNOSIS — E039 Hypothyroidism, unspecified: Secondary | ICD-10-CM | POA: Diagnosis not present

## 2022-09-23 DIAGNOSIS — E782 Mixed hyperlipidemia: Secondary | ICD-10-CM | POA: Diagnosis not present

## 2022-09-23 DIAGNOSIS — Z8639 Personal history of other endocrine, nutritional and metabolic disease: Secondary | ICD-10-CM

## 2022-09-23 DIAGNOSIS — Z114 Encounter for screening for human immunodeficiency virus [HIV]: Secondary | ICD-10-CM | POA: Diagnosis not present

## 2022-09-23 DIAGNOSIS — E6609 Other obesity due to excess calories: Secondary | ICD-10-CM

## 2022-09-23 DIAGNOSIS — D509 Iron deficiency anemia, unspecified: Secondary | ICD-10-CM

## 2022-09-23 DIAGNOSIS — I10 Essential (primary) hypertension: Secondary | ICD-10-CM | POA: Diagnosis not present

## 2022-09-23 DIAGNOSIS — E1142 Type 2 diabetes mellitus with diabetic polyneuropathy: Secondary | ICD-10-CM

## 2022-09-23 DIAGNOSIS — Z1159 Encounter for screening for other viral diseases: Secondary | ICD-10-CM

## 2022-09-23 DIAGNOSIS — Z0000 Encounter for general adult medical examination without abnormal findings: Secondary | ICD-10-CM

## 2022-09-23 DIAGNOSIS — Z6838 Body mass index (BMI) 38.0-38.9, adult: Secondary | ICD-10-CM

## 2022-09-23 LAB — MICROALBUMIN, URINE WAIVED
Creatinine, Urine Waived: 10 mg/dL (ref 10–300)
Microalb, Ur Waived: 10 mg/L (ref 0–19)
Microalb/Creat Ratio: <30 mg/g (ref <30)

## 2022-09-23 MED ORDER — OZEMPIC (1 MG/DOSE) 4 MG/3ML ~~LOC~~ SOPN: 1.0000 mg | PEN_INJECTOR | SUBCUTANEOUS | 2 refills | Status: DC

## 2022-09-23 MED ORDER — VITAMIN D (ERGOCALCIFEROL) 1.25 MG (50000 UNIT) PO CAPS: ORAL_CAPSULE | ORAL | 0 refills | Status: DC

## 2022-09-23 NOTE — Assessment & Plan Note (Signed)
Chronic, historic condition.  Currently taking Jardiance 25 mg PO QD, Metformin 1000 mg PO BID and Semaglutide 1 mg/ dose weekly. Refills sent today.  Previous A1c was 7.1 in September 2023- will recheck today Results to dictate further management Continue current regimen pending results Follow up in 3 months for monitoring

## 2022-09-23 NOTE — Assessment & Plan Note (Signed)
Chronic.  Controlled.  Continue with current medication regimen of rosuvastatin 20mg  daily.  Labs ordered today.  Return to clinic in 6 months for reevaluation.  Call sooner if concerns arise.

## 2022-09-23 NOTE — Assessment & Plan Note (Signed)
Chronic.  Controlled.  Continue with current medication regimen.  Labs ordered today.  Return to clinic in 6 months for reevaluation.  Call sooner if concerns arise.  ? ?

## 2022-09-23 NOTE — Assessment & Plan Note (Signed)
Chronic, historic condition, appears stable and well controlled on current medication regimen - Carvedilol 25 mg PO QD, Zestoretic 20-12.5 mg PO QD. Labs ordered today.  He appears to be tolerating this well  BP results at home continue to remain in goal  Continue current regimen  Follow up in 6 months for monitoring

## 2022-09-23 NOTE — Assessment & Plan Note (Signed)
Chronic, stable. TSH within normal limits. Continue current levothyroxine dose.

## 2022-09-24 LAB — CBC WITH DIFFERENTIAL/PLATELET
Basophils Absolute: 0 10*3/uL (ref 0.0–0.2)
Basos: 0 %
EOS (ABSOLUTE): 0.2 10*3/uL (ref 0.0–0.4)
Eos: 2 %
Hematocrit: 41.2 % (ref 37.5–51.0)
Hemoglobin: 13.5 g/dL (ref 13.0–17.7)
Immature Grans (Abs): 0 10*3/uL (ref 0.0–0.1)
Immature Granulocytes: 0 %
Lymphocytes Absolute: 1.6 10*3/uL (ref 0.7–3.1)
Lymphs: 23 %
MCH: 29 pg (ref 26.6–33.0)
MCHC: 32.8 g/dL (ref 31.5–35.7)
MCV: 89 fL (ref 79–97)
Monocytes Absolute: 0.4 10*3/uL (ref 0.1–0.9)
Monocytes: 5 %
Neutrophils Absolute: 5 10*3/uL (ref 1.4–7.0)
Neutrophils: 70 %
Platelets: 324 10*3/uL (ref 150–450)
RBC: 4.65 x10E6/uL (ref 4.14–5.80)
RDW: 14.5 % (ref 11.6–15.4)
WBC: 7.3 10*3/uL (ref 3.4–10.8)

## 2022-09-24 LAB — LIPID PANEL
Chol/HDL Ratio: 3 ratio (ref 0.0–5.0)
Cholesterol, Total: 115 mg/dL (ref 100–199)
HDL: 38 mg/dL — ABNORMAL LOW (ref >39)
LDL Chol Calc (NIH): 49 mg/dL (ref 0–99)
Triglycerides: 166 mg/dL — ABNORMAL HIGH (ref 0–149)
VLDL Cholesterol Cal: 28 mg/dL (ref 5–40)

## 2022-09-24 LAB — COMPREHENSIVE METABOLIC PANEL
ALT: 20 IU/L (ref 0–44)
AST: 17 IU/L (ref 0–40)
Albumin/Globulin Ratio: 1.7 (ref 1.2–2.2)
Albumin: 4.5 g/dL (ref 3.9–4.9)
Alkaline Phosphatase: 70 IU/L (ref 44–121)
BUN/Creatinine Ratio: 12 (ref 10–24)
BUN: 10 mg/dL (ref 8–27)
Bilirubin Total: 0.3 mg/dL (ref 0.0–1.2)
CO2: 22 mmol/L (ref 20–29)
Calcium: 9.2 mg/dL (ref 8.6–10.2)
Chloride: 103 mmol/L (ref 96–106)
Creatinine, Ser: 0.83 mg/dL (ref 0.76–1.27)
Globulin, Total: 2.6 g/dL (ref 1.5–4.5)
Glucose: 124 mg/dL — ABNORMAL HIGH (ref 70–99)
Potassium: 4.4 mmol/L (ref 3.5–5.2)
Sodium: 138 mmol/L (ref 134–144)
Total Protein: 7.1 g/dL (ref 6.0–8.5)
eGFR: 99 mL/min/{1.73_m2} (ref >59)

## 2022-09-24 LAB — T4, FREE: Free T4: 1.24 ng/dL (ref 0.82–1.77)

## 2022-09-24 LAB — HEPATITIS C ANTIBODY: Hep C Virus Ab: NONREACTIVE

## 2022-09-24 LAB — HIV ANTIBODY (ROUTINE TESTING W REFLEX): HIV Screen 4th Generation wRfx: NONREACTIVE

## 2022-09-24 LAB — HEMOGLOBIN A1C
Est. average glucose Bld gHb Est-mCnc: 174 mg/dL
Hgb A1c MFr Bld: 7.7 % — ABNORMAL HIGH (ref 4.8–5.6)

## 2022-09-24 LAB — TSH: TSH: 3.09 u[IU]/mL (ref 0.450–4.500)

## 2022-09-24 NOTE — Progress Notes (Signed)
Please let patient know that his lab work shows that his A1c increased from 7.1 to 7.7.  No medication changes at this time.  Please make sure to decrease carbohydrate intake such as sodas, sweet tea, and sweets.  Cholesterol is well controlled. No other concerns at this time.  Follow up as discussed.

## 2022-10-20 ENCOUNTER — Other Ambulatory Visit: Payer: Self-pay | Admitting: Family Medicine

## 2022-10-20 NOTE — Telephone Encounter (Signed)
Requested Prescriptions  Pending Prescriptions Disp Refills   levothyroxine (SYNTHROID) 25 MCG tablet [Pharmacy Med Name: LEVOTHYROXINE 25 MCG TABLET] 90 tablet 0    Sig: Take 1 tablet (25 mcg total) by mouth every morning.     Endocrinology:  Hypothyroid Agents Passed - 10/20/2022 11:57 AM      Passed - TSH in normal range and within 360 days    TSH  Date Value Ref Range Status  09/23/2022 3.090 0.450 - 4.500 uIU/mL Final         Passed - Valid encounter within last 12 months    Recent Outpatient Visits           3 weeks ago Encounter for annual wellness exam in Medicare patient   [place], NP   3 months ago Primary hypertension   [name] M[place], Dani Gobble, PA-C   7 months ago Primary hypertension   [name] Mecum, Erin E, PA-C   9 months ago Primary hypertension   [place] V[place], Avanti, MD   11 months ago Anemia, unspecified type   [place], MD       Future Appointments             In 5 months Jon Billings, NP [facility], [place]

## 2022-11-19 ENCOUNTER — Other Ambulatory Visit: Payer: Self-pay | Admitting: Nurse Practitioner

## 2022-11-19 DIAGNOSIS — E119 Type 2 diabetes mellitus without complications: Secondary | ICD-10-CM

## 2022-11-19 NOTE — Telephone Encounter (Signed)
Rx- gabapentin- 09/20/22 #270- too soon Requested Prescriptions  Pending Prescriptions Disp Refills   ACCU-CHEK AVIVA PLUS test strip [Pharmacy Med Name: [place] TEST STRP] 100 strip 0    Sig: USE [place] A DAY     Endocrinology: Diabetes - Testing Supplies Passed - 11/19/2022 11:10 AM      Passed - Valid encounter within last 12 months    Recent Outpatient Visits           1 month ago Encounter for annual wellness exam in Medicare patient   [place], NP   4 months ago Primary hypertension   [place] [name] Mecum, Dani Gobble, PA-C   8 months ago Primary hypertension   [place], Dani Gobble, PA-C   10 months ago Primary hypertension   [place] Vigg, Avanti, MD   1 year ago Anemia, unspecified type   [place], MD       Future Appointments             In 4 months Jon Billings, NP [place]            Refused Prescriptions Disp Refills   gabapentin (NEURONTIN) 300 MG capsule [Pharmacy Med Name: GABAPENTIN 300 MG CAPSULE] 90 capsule 0    Sig: Take 1 capsule (300 mg total) by mouth 3 (three) times daily.     Neurology: Anticonvulsants - gabapentin Passed - 11/19/2022 11:10 AM      Passed - Cr in normal range and within 360 days    Creatinine  Date Value Ref Range Status  01/10/2014 0.82 0.60 - 1.30 mg/dL Final   Creatinine, Ser  Date Value Ref Range Status  09/23/2022 0.83 0.76 - 1.27 mg/dL Final         Passed - Completed PHQ-2 or PHQ-9 in the last 360 days      Passed - Valid encounter within last 12 months    Recent Outpatient Visits           1 month ago Encounter for annual wellness exam in Medicare patient   [place], Karen, NP   4 months ago Primary hypertension   [place],  Dani Gobble, PA-C   8 months ago Primary hypertension   [place], Dani Gobble, PA-C   10 months ago Primary hypertension   [place] Vigg, Avanti, MD   1 year ago Anemia, unspecified type   [place] Charlynne Cousins, MD       Future Appointments             In 4 months Jon Billings, NP [place]

## 2022-11-27 ENCOUNTER — Other Ambulatory Visit: Payer: Self-pay | Admitting: Nurse Practitioner

## 2022-11-27 DIAGNOSIS — E119 Type 2 diabetes mellitus without complications: Secondary | ICD-10-CM

## 2022-11-29 NOTE — Telephone Encounter (Signed)
Requested Prescriptions  Refused Prescriptions Disp Refills   gabapentin (NEURONTIN) 300 MG capsule [Pharmacy Med Name: GABAPENTIN 300 MG CAPSULE] 90 capsule 0    Sig: Take 1 capsule (300 mg total) by mouth 3 (three) times daily.     Neurology: Anticonvulsants - gabapentin Passed - 11/27/2022  9:24 AM      Passed - Cr in normal range and within 360 days    Creatinine  Date Value Ref Range Status  01/10/2014 0.82 0.60 - 1.30 mg/dL Final   Creatinine, Ser  Date Value Ref Range Status  09/23/2022 0.83 0.76 - 1.27 mg/dL Final         Passed - Completed PHQ-2 or PHQ-9 in the last 360 days      Passed - Valid encounter within last 12 months    Recent Outpatient Visits           2 months ago Encounter for annual wellness exam in Medicare patient   [place], NP   5 months ago Primary hypertension   [place], Dani Gobble, PA-C   8 months ago Primary hypertension   [place], Dani Gobble, PA-C   11 months ago Primary hypertension   [place] Vigg, Avanti, MD   1 year ago Anemia, unspecified type   [place] Charlynne Cousins, MD       Future Appointments             In 3 months Jon Billings, NP [place]

## 2022-12-18 ENCOUNTER — Other Ambulatory Visit: Payer: Self-pay | Admitting: Physician Assistant

## 2022-12-18 ENCOUNTER — Other Ambulatory Visit: Payer: Self-pay | Admitting: Nurse Practitioner

## 2022-12-18 DIAGNOSIS — I10 Essential (primary) hypertension: Secondary | ICD-10-CM

## 2022-12-18 DIAGNOSIS — E119 Type 2 diabetes mellitus without complications: Secondary | ICD-10-CM

## 2022-12-20 NOTE — Telephone Encounter (Signed)
Requested medication (s) are due for refill today: yes  Requested medication (s) are on the active medication list: yes  Last refill:  09/23/22 3 ML 2 RF  Future visit scheduled: yes  Notes to clinic:  overdue lab work   Requested Prescriptions  Pending Prescriptions Disp Refills   OZEMPIC, 1 MG/DOSE, 4 MG/3ML SOPN [Pharmacy Med Name: OZEMPIC 1 MG DOSE PEN] 3 mL 0    Sig: Inject 1 mg into the muscle once a week.     Endocrinology:  Diabetes - GLP-1 Receptor Agonists - semaglutide Failed - 12/18/2022  9:22 AM      Failed - HBA1C in normal range and within 180 days    HB A1C (BAYER DCA - WAIVED)  Date Value Ref Range Status  12/30/2021 6.2 (H) 4.8 - 5.6 % Final    Comment:             Prediabetes: 5.7 - 6.4          Diabetes: >6.4          Glycemic control for adults with diabetes: <7.0    Hgb A1c MFr Bld  Date Value Ref Range Status  09/23/2022 7.7 (H) 4.8 - 5.6 % Final    Comment:             Prediabetes: 5.7 - 6.4          Diabetes: >6.4          Glycemic control for adults with diabetes: <7.0          Passed - Cr in normal range and within 360 days    Creatinine  Date Value Ref Range Status  01/10/2014 0.82 0.60 - 1.30 mg/dL Final   Creatinine, Ser  Date Value Ref Range Status  09/23/2022 0.83 0.76 - 1.27 mg/dL Final         Passed - Valid encounter within last 6 months    Recent Outpatient Visits           2 months ago Encounter for annual wellness exam in Medicare patient   [place], NP   5 months ago Primary hypertension   [place] [name] Mecum, Dani Gobble, PA-C   9 months ago Primary hypertension   [place] Dani Gobble, PA-C   11 months ago Primary hypertension   [place] Vigg, Avanti, MD   1 year ago Anemia, unspecified type   [place] Vigg, Loman [place], MD       Future Appointments             In 3  months Jon Billings, NP [place], [facility]

## 2022-12-20 NOTE — Telephone Encounter (Signed)
Requested Prescriptions  Pending Prescriptions Disp Refills   lisinopril-hydrochlorothiazide (ZESTORETIC) 20-12.5 MG tablet [Pharmacy Med Name: LISINOPRIL-HCTZ 20-12.5 MG TAB] 90 tablet 0    Sig: Take 1 tablet by mouth daily.     Cardiovascular:  ACEI + Diuretic Combos Passed - 12/18/2022  9:21 AM      Passed - Na in normal range and within 180 days    Sodium  Date Value Ref Range Status  09/23/2022 138 134 - 144 mmol/L Final  01/10/2014 134 (L) 136 - 145 mmol/L Final         Passed - K in normal range and within 180 days    Potassium  Date Value Ref Range Status  09/23/2022 4.4 3.5 - 5.2 mmol/L Final  01/10/2014 3.5 3.5 - 5.1 mmol/L Final         Passed - Cr in normal range and within 180 days    Creatinine  Date Value Ref Range Status  01/10/2014 0.82 0.60 - 1.30 mg/dL Final   Creatinine, Ser  Date Value Ref Range Status  09/23/2022 0.83 0.76 - 1.27 mg/dL Final         Passed - eGFR is 30 or above and within 180 days    EGFR (African American)  Date Value Ref Range Status  01/10/2014 >60  Final   GFR calc Af Amer  Date Value Ref Range Status  12/09/2019 >60 >60 mL/min Final   EGFR (Non-African Amer.)  Date Value Ref Range Status  01/10/2014 >60  Final    Comment:    eGFR values <106m/min/1.73 m2 may be an indication of chronic kidney disease (CKD). Calculated eGFR is useful in patients with stable renal function. The eGFR calculation will not be reliable in acutely ill patients when serum creatinine is changing rapidly. It is not useful in  patients on dialysis. The eGFR calculation may not be applicable to patients at the low and high extremes of body sizes, pregnant women, and vegetarians.    GFR, Estimated  Date Value Ref Range Status  04/04/2022 >60 >60 mL/min Final    Comment:    (NOTE) Calculated using the CKD-EPI Creatinine Equation (2021)    eGFR  Date Value Ref Range Status  09/23/2022 99 >59 mL/min/1.73 Final         Passed - Patient is not  pregnant      Passed - Last BP in normal range    BP Readings from Last 1 Encounters:  09/23/22 136/72         Passed - Valid encounter within last 6 months    Recent Outpatient Visits           2 months ago Encounter for annual wellness exam in [place] patient   [place]   5 months ago Primary hypertension   [place] [name] Mecum, EDani Gobble PA-C   9 months ago Primary hypertension   [place] Gob[place] PA-C   11 months ago Primary hypertension   [place]   1 year ago Anemia, unspecified type   [place]       Future Appointments             In 3 months [place] Bill[place] NP [place] [facility]

## 2022-12-29 ENCOUNTER — Other Ambulatory Visit: Payer: Self-pay | Admitting: Nurse Practitioner

## 2022-12-29 DIAGNOSIS — D649 Anemia, unspecified: Secondary | ICD-10-CM

## 2022-12-29 DIAGNOSIS — E119 Type 2 diabetes mellitus without complications: Secondary | ICD-10-CM

## 2022-12-29 NOTE — Telephone Encounter (Signed)
Requested Prescriptions  Pending Prescriptions Disp Refills   rosuvastatin (CRESTOR) 20 MG tablet [Pharmacy Med Name: ROSUVASTATIN CALCIUM 20 MG TAB] 90 tablet 1    Sig: Take 1 tablet (20 mg total) by mouth daily.     Cardiovascular:  Antilipid - Statins 2 Failed - 12/29/2022  9:21 AM      Failed - Lipid Panel in normal range within the last 12 months    Cholesterol, Total  Date Value Ref Range Status  09/23/2022 115 100 - 199 mg/dL Final   LDL Chol Calc (NIH)  Date Value Ref Range Status  09/23/2022 49 0 - 99 mg/dL Final   HDL  Date Value Ref Range Status  09/23/2022 38 (L) >39 mg/dL Final   Triglycerides  Date Value Ref Range Status  09/23/2022 166 (H) 0 - 149 mg/dL Final         Passed - Cr in normal range and within 360 days    Creatinine  Date Value Ref Range Status  01/10/2014 0.82 0.60 - 1.30 mg/dL Final   Creatinine, Ser  Date Value Ref Range Status  09/23/2022 0.83 0.76 - 1.27 mg/dL Final         Passed - Patient is not pregnant      Passed - Valid encounter within last 12 months    Recent Outpatient Visits           3 months ago Encounter for annual wellness exam in Medicare patient   [place], NP   6 months ago Primary hypertension   [place] [name] Mecum, Dani Gobble, PA-C   9 months ago Primary hypertension   [place], Dani Gobble, PA-C   12 months ago Primary hypertension   [place] Vigg, Avanti, MD   1 year ago Anemia, unspecified type   [place] Vigg, Loman [place], MD       Future Appointments             In 2 months Jon Billings, NP [place], [facility]

## 2023-01-18 ENCOUNTER — Other Ambulatory Visit: Payer: Self-pay | Admitting: Nurse Practitioner

## 2023-01-18 ENCOUNTER — Other Ambulatory Visit: Payer: Self-pay | Admitting: Physician Assistant

## 2023-01-18 DIAGNOSIS — E119 Type 2 diabetes mellitus without complications: Secondary | ICD-10-CM

## 2023-01-18 MED ORDER — METFORMIN HCL 1000 MG PO TABS: 1000.0000 mg | ORAL_TABLET | Freq: Two times a day (BID) | ORAL | 1 refills | Status: DC

## 2023-01-18 MED ORDER — OZEMPIC (1 MG/DOSE) 4 MG/3ML ~~LOC~~ SOPN: 1.0000 mg | PEN_INJECTOR | SUBCUTANEOUS | 1 refills | Status: DC

## 2023-01-18 NOTE — Telephone Encounter (Signed)
Pt came to the office stating that he is out of his Rx's Ozempic and metformin and would like to see if it can be called in today so that he does not miss any dosage.  Pt would like to be called once it has been called in.

## 2023-01-18 NOTE — Telephone Encounter (Signed)
Called and notified patient that medications have been sent in for him.

## 2023-01-19 NOTE — Telephone Encounter (Signed)
Unable to refill per protocol, Rx request is too soon. Last refill 01/18/23, duplicate request.  Requested Prescriptions  Pending Prescriptions Disp Refills   metFORMIN (GLUCOPHAGE) 1000 MG tablet [Pharmacy Med Name: METFORMIN HCL 1,000 MG TABLET] 60 tablet 0    Sig: Take 1 tablet (1,000 mg total) by mouth 2 (two) times daily.     Endocrinology:  Diabetes - Biguanides Passed - 01/18/2023 10:40 AM      Passed - Cr in normal range and within 360 days    Creatinine  Date Value Ref Range Status  01/10/2014 0.82 0.60 - 1.30 mg/dL Final   Creatinine, Ser  Date Value Ref Range Status  09/23/2022 0.83 0.76 - 1.27 mg/dL Final         Passed - HBA1C is between 0 and 7.9 and within 180 days    HB A1C (BAYER DCA - WAIVED)  Date Value Ref Range Status  12/30/2021 6.2 (H) 4.8 - 5.6 % Final    Comment:             Prediabetes: 5.7 - 6.4          Diabetes: >6.4          Glycemic control for adults with diabetes: <7.0    Hgb A1c MFr Bld  Date Value Ref Range Status  09/23/2022 7.7 (H) 4.8 - 5.6 % Final    Comment:             Prediabetes: 5.7 - 6.4          Diabetes: >6.4          Glycemic control for adults with diabetes: <7.0          Passed - eGFR in normal range and within 360 days    EGFR (African American)  Date Value Ref Range Status  01/10/2014 >60  Final   GFR calc Af Amer  Date Value Ref Range Status  12/09/2019 >60 >60 mL/min Final   EGFR (Non-African Amer.)  Date Value Ref Range Status  01/10/2014 >60  Final    Comment:    eGFR values <51mL/min/1.73 m2 may be an indication of chronic kidney disease (CKD). Calculated eGFR is useful in patients with stable renal function. The eGFR calculation will not be reliable in acutely ill patients when serum creatinine is changing rapidly. It is not useful in  patients on dialysis. The eGFR calculation may not be applicable to patients at the low and high extremes of body sizes, pregnant women, and vegetarians.    GFR,  Estimated  Date Value Ref Range Status  04/04/2022 >60 >60 mL/min Final    Comment:    (NOTE) Calculated using the CKD-EPI Creatinine Equation (2021)    eGFR  Date Value Ref Range Status  09/23/2022 99 >59 mL/min/1.73 Final         Passed - B12 Level in normal range and within 720 days    Vitamin B-12  Date Value Ref Range Status  10/14/2021 412 232 - 1,245 pg/mL Final         Passed - Valid encounter within last 6 months    Recent Outpatient Visits           3 months ago Encounter for annual wellness exam in Medicare patient   [place] [facility] [name], NP   6 months ago Primary hypertension   [place] Mecum, [name], PA-C   10 months ago Primary hypertension    [address]  [facility] Mecum, [name], PA-C   1 year ago Primary hypertension   [place] [name] Vigg, Avanti, MD   1 year ago Anemia, unspecified type   [place] [facility] [place] Vigg, Avanti, MD       Future Appointments             In 2 months [name], NP [place] [name], [facility]            Passed - CBC within normal limits and completed in the last 12 months    WBC  Date Value Ref Range Status  09/23/2022 7.3 3.4 - 10.8 x10E3/uL Final  07/28/2022 7.6 4.0 - 10.5 K/uL Final   RBC  Date Value Ref Range Status  09/23/2022 4.65 4.14 - 5.80 x10E6/uL Final  07/28/2022 5.28 4.22 - 5.81 MIL/uL Final   Hemoglobin  Date Value Ref Range Status  09/23/2022 13.5 13.0 - 17.7 g/dL Final   Hematocrit  Date Value Ref Range Status  09/23/2022 41.2 37.5 - 51.0 % Final   MCHC  Date Value Ref Range Status  09/23/2022 32.8 31.5 - 35.7 g/dL Final  67/61/9509 32.6 30.0 - 36.0 g/dL Final   [facility]  Date Value Ref Range Status  09/23/2022 29.0 26.6 - 33.0 pg Final  07/28/2022 28.2 26.0 - 34.0 pg Final   MCV  Date Value Ref Range Status  09/23/2022 89 79 - 97 fL Final  01/10/2014 86 80  - 100 fL Final   No results found for: "PLTCOUNTKUC", "LABPLAT", "POCPLA" RDW  Date Value Ref Range Status  09/23/2022 14.5 11.6 - 15.4 % Final  01/10/2014 12.8 11.5 - 14.5 % Final

## 2023-01-19 NOTE — Telephone Encounter (Signed)
Unable to refill per protocol, Rx request is too soon. Last refill 01/18/23, duplicate request.  Requested Prescriptions  Pending Prescriptions Disp Refills   OZEMPIC, 1 MG/DOSE, 4 MG/3ML SOPN [Pharmacy Med Name: OZEMPIC 1 MG DOSE PEN] 3 mL 0    Sig: Inject 1 mg into the muscle once a week.     Endocrinology:  Diabetes - GLP-1 Receptor Agonists - semaglutide Failed - 01/18/2023 10:41 AM      Failed - HBA1C in normal range and within 180 days    HB A1C (BAYER DCA - WAIVED)  Date Value Ref Range Status  12/30/2021 6.2 (H) 4.8 - 5.6 % Final    Comment:             Prediabetes: 5.7 - 6.4          Diabetes: >6.4          Glycemic control for adults with diabetes: <7.0    Hgb A1c MFr Bld  Date Value Ref Range Status  09/23/2022 7.7 (H) 4.8 - 5.6 % Final    Comment:             Prediabetes: 5.7 - 6.4          Diabetes: >6.4          Glycemic control for adults with diabetes: <7.0          Passed - Cr in normal range and within 360 days    Creatinine  Date Value Ref Range Status  01/10/2014 0.82 0.60 - 1.30 mg/dL Final   Creatinine, Ser  Date Value Ref Range Status  09/23/2022 0.83 0.76 - 1.27 mg/dL Final         Passed - Valid encounter within last 6 months    Recent Outpatient Visits           3 months ago Encounter for annual wellness exam in Medicare patient   [place] [facility] [place] [name], NP   6 months ago Primary hypertension   [place] [address] [facility] Mecum, [name], PA-C   10 months ago Primary hypertension   [place] [name] Mecum, [name], PA-C   1 year ago Primary hypertension   [place] [name] Vigg, Avanti, MD   1 year ago Anemia, unspecified type   [place] [name] Vigg, Roma Schanz, MD       Future Appointments             In 2 months [name], NP [place] [facility], [facility]

## 2023-02-01 ENCOUNTER — Inpatient Hospital Stay: Payer: Medicare HMO | Attending: Oncology

## 2023-02-01 DIAGNOSIS — D500 Iron deficiency anemia secondary to blood loss (chronic): Secondary | ICD-10-CM | POA: Diagnosis not present

## 2023-02-01 DIAGNOSIS — K625 Hemorrhage of anus and rectum: Secondary | ICD-10-CM | POA: Insufficient documentation

## 2023-02-01 DIAGNOSIS — K626 Ulcer of anus and rectum: Secondary | ICD-10-CM | POA: Insufficient documentation

## 2023-02-01 LAB — CBC WITH DIFFERENTIAL/PLATELET
Abs Immature Granulocytes: 0.02 10*3/uL (ref 0.00–0.07)
Basophils Absolute: 0 10*3/uL (ref 0.0–0.1)
Basophils Relative: 0 %
Eosinophils Absolute: 0.1 10*3/uL (ref 0.0–0.5)
Eosinophils Relative: 2 %
HCT: 42.4 % (ref 39.0–52.0)
Hemoglobin: 14.3 g/dL (ref 13.0–17.0)
Immature Granulocytes: 0 %
Lymphocytes Relative: 30 %
Lymphs Abs: 1.8 10*3/uL (ref 0.7–4.0)
MCH: 29.9 pg (ref 26.0–34.0)
MCHC: 33.7 g/dL (ref 30.0–36.0)
MCV: 88.5 fL (ref 80.0–100.0)
Monocytes Absolute: 0.4 10*3/uL (ref 0.1–1.0)
Monocytes Relative: 7 %
Neutro Abs: 3.6 10*3/uL (ref 1.7–7.7)
Neutrophils Relative %: 61 %
Platelets: 291 10*3/uL (ref 150–400)
RBC: 4.79 MIL/uL (ref 4.22–5.81)
RDW: 12.4 % (ref 11.5–15.5)
WBC: 6 10*3/uL (ref 4.0–10.5)
nRBC: 0 % (ref 0.0–0.2)

## 2023-02-01 LAB — IRON AND TIBC
Iron: 101 ug/dL (ref 45–182)
Saturation Ratios: 24 % (ref 17.9–39.5)
TIBC: 414 ug/dL (ref 250–450)
UIBC: 313 ug/dL

## 2023-02-01 LAB — FERRITIN: Ferritin: 32 ng/mL (ref 24–336)

## 2023-02-02 ENCOUNTER — Inpatient Hospital Stay: Payer: Medicare HMO | Admitting: Oncology

## 2023-02-02 ENCOUNTER — Encounter: Payer: Self-pay | Admitting: Oncology

## 2023-02-02 VITALS — BP 108/67 | HR 64 | Temp 97.1°F | Resp 18 | Wt 193.1 lb

## 2023-02-02 DIAGNOSIS — D509 Iron deficiency anemia, unspecified: Secondary | ICD-10-CM | POA: Diagnosis not present

## 2023-02-02 DIAGNOSIS — D500 Iron deficiency anemia secondary to blood loss (chronic): Secondary | ICD-10-CM | POA: Diagnosis not present

## 2023-02-02 DIAGNOSIS — K625 Hemorrhage of anus and rectum: Secondary | ICD-10-CM | POA: Diagnosis not present

## 2023-02-02 DIAGNOSIS — K626 Ulcer of anus and rectum: Secondary | ICD-10-CM | POA: Diagnosis not present

## 2023-02-02 NOTE — Progress Notes (Signed)
Pt here for follow up. No new concerns voiced.   

## 2023-02-02 NOTE — Progress Notes (Signed)
Hematology/Oncology Progress note [phone] [phone]         Patient Care Team: [name], NP as PCP - General (Nurse Practitioner) Leanna Sato, MD (Family Medicine) Rickard Patience, MD as Consulting Physician (Hematology and Oncology) Toney Reil, MD as Consulting Physician (Gastroenterology) Iran Ouch, MD as Consulting Physician (Cardiology)  ASSESSMENT & PLAN:   Iron deficiency anemia Iron deficiency anemia secondary to chronic GI blood loss. Status post IV Venofer treatments Labs reviewed and discussed patient Hemoglobin has normalized.  Iron panel has been stable.  Lab Results  Component Value Date   IRON 101 02/01/2023   TIBC 414 02/01/2023   IRONPCTSAT 24 02/01/2023   FERRITIN 32 02/01/2023     No need for IV venfoer or oral iron supplementation. Spanish Teacher, music utilized for this encounter.   Patient is discharged from my clinic. I recommend patient to continue follow up with primary care physician. Patient may re-establish care in the future if clinically indicated. All questions were answered.  Rickard Patience, MD, PhD [facility] Hematology Oncology 02/02/2023   CHIEF COMPLAINTS/REASON FOR VISIT:  Follow-up for iron deficiency anemia HISTORY OF PRESENTING ILLNESS:   Shane Preston is a  62 y.o.  male with PMH listed below was seen in consultation at the request of  [name], NP  for evaluation of anemia 10/13/2021, hemoglobin 10.4, MCV 80. 10/14/2021, iron panel showed iron saturation 7, ferritin 8, TIBC 417. 10/21/2021, patient had a CBC done which showed a hemoglobin of 9.8, MCV 81. Review previous lab results.  Anemia is new onset, since October 2022. Patient reports painless rectal bleeding.  Has been referred to establish care with gastroenterology Dr. Allegra Lai and has a colonoscopy scheduled.  10/30/2021, patient had colonoscopy done by Dr. Allegra Lai colonoscopy showed poor preparation of  the colon.  Solitary ulcer in the distal rectum.  Patient was recommended to follow-up in 6 months with 2-day prep. 04/20/22 repeat colonoscopy  INTERVAL HISTORY Shane Preston Shane Preston is a 62 y.o. male who has above history reviewed by me today presents for follow up visit for Iron deficiency anemia Today patient reports feeling well.   He is not on any iron supplementation.  Denies hematochezia, hematuria, hematemesis, epistaxis, black tarry stool or easy bruising.    Review of Systems  Constitutional:  Negative for appetite change, chills, fatigue, fever and unexpected weight change.  HENT:   Negative for hearing loss and voice change.   Eyes:  Negative for eye problems and icterus.  Respiratory:  Negative for chest tightness, cough and shortness of breath.   Cardiovascular:  Negative for chest pain and leg swelling.  Gastrointestinal:  Negative for abdominal distention, abdominal pain and blood in stool.  Endocrine: Negative for hot flashes.  Genitourinary:  Negative for difficulty urinating, dysuria and frequency.   Musculoskeletal:  Negative for arthralgias.  Skin:  Negative for itching and rash.  Neurological:  Negative for light-headedness and numbness.  Hematological:  Negative for adenopathy. Does not bruise/bleed easily.  Psychiatric/Behavioral:  Negative for confusion.     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   Lumbar stenosis    Neuropathy    Peripheral vascular disease    TIA (transient ischemic attack)    Tinea pedis  SURGICAL HISTORY: Past Surgical History:  Procedure Laterality Date   BIOPSY N/A 04/20/2022   Procedure: BIOPSY;  Surgeon: Toney Reil, MD;  Location[facility];  Service: Endoscopy;  Laterality: N/A;   CARDIAC  CATHETERIZATION     [facility]    COLONOSCOPY WITH PROPOFOL N/A 10/30/2021   Procedure: COLONOSCOPY WITH PROPOFOL;  Surgeon: Toney Reil, MD;  Location: [facility] END[facility];  Service: Gastroenterology;  Laterality: N/A;   COLONOSCOPY WITH PROPOFOL N/A 04/20/2022   Procedure: COLONOSCOPY WITH PROPOFOL;  Surgeon: Toney Reil, MD;  Location: [facility];  Service: Endoscopy;  Laterality: N/A;   ESOPHAGOGASTRODUODENOSCOPY (EGD) WITH PROPOFOL N/A 04/20/2022   Procedure: ESOPHAGOGASTRODUODENOSCOPY (EGD) WITH PROPOFOL;  Surgeon: Toney Reil, MD;  Location: [facility];  Service: Endoscopy;  Laterality: N/A;   EYE SURGERY     HERNIA REPAIR  10/11/2005   lumbar decompression surgery  10/11/2008    SOCIAL HISTORY: Social History   Socioeconomic History   Marital status: Married    Spouse name: Not on file   Number of children: Not on file   Years of education: Not on file   Highest education level: Not on file  Occupational History   Not on file  Tobacco Use   Smoking status: Never   Smokeless tobacco: Never  Vaping Use   Vaping Use: Never used  Substance and Sexual Activity   Alcohol use: Not Currently   Drug use: Not Currently   Sexual activity: Not Currently  Other Topics Concern   Not on file  Social History Narrative   ** Merged History Encounter **       Social Determinants of Health   Financial Resource Strain: Low Risk  (09/01/2021)   Overall Financial Resource Strain (CARDIA)    Difficulty of Paying Living Expenses: Not hard at all  Food Insecurity: No Food Insecurity (09/01/2021)   Hunger Vital Sign    Worried About Running Out of Food in the Last Year: Never true    Ran Out of Food in the Last Year: Never true  Transportation Needs: No Transportation Needs (09/01/2021)   PRAPARE - Administrator, Civil Service (Medical): No    Lack of Transportation (Non-Medical): No  Physical Activity: Inactive (09/01/2021)   Exercise  Vital Sign    Days of Exercise per Week: 0 days    Minutes of Exercise per Session: 0 min  Stress: No Stress Concern Present (09/01/2021)   [employer] of Occupational Health - Occupational Stress Questionnaire    Feeling of Stress : Not at all  Social Connections: Moderately Isolated (09/01/2021)   Social Connection and Isolation Panel [NHANES]    Frequency of Communication with Friends and Family: More than three times a week    Frequency of Social Gatherings with Friends and Family: More than three times a week    Attends Religious Services: Never    Database administrator or Organizations: No    Attends Banker Meetings: Never    Marital Status: Married  Catering manager Violence: Not At Risk (09/01/2021)   Humiliation, Afraid, Rape, and Kick questionnaire    Fear of Current or Ex-Partner: No    Emotionally Abused: No    Physically Abused: No    Sexually Abused: No    FAMILY HISTORY: Family History  Family history unknown: Yes    ALLERGIES:  has No Known Allergies.  MEDICATIONS:  Current Outpatient Medications  Medication Sig Dispense Refill   ACCU-CHEK  AVIVA PLUS test strip USE 1 THREE TIMES A DAY 100 strip 0   Accu-Chek Softclix Lancets lancets USE 1 LANCET THREE TIMES A DAY 100 each 1   aspirin EC 81 MG tablet Take 81 mg by mouth daily. Swallow whole.     carvedilol (COREG) 25 MG tablet Take 1 tablet (25 mg total) by mouth 2 (two) times daily. 180 tablet 1   cyanocobalamin (VITAMIN B12) 1000 MCG tablet Take 1,000 mcg by mouth daily.     gabapentin (NEURONTIN) 300 MG capsule Take 1 capsule (300 mg total) by mouth 3 (three) times daily. 270 capsule 0   JARDIANCE 25 MG TABS tablet Take 1 tablet (25 mg total) by mouth daily. 90 tablet 1   levothyroxine (SYNTHROID) 25 MCG tablet Take 1 tablet (25 mcg total) by mouth every morning. 90 tablet 3   lisinopril-hydrochlorothiazide (ZESTORETIC) 20-12.5 MG tablet Take 1 tablet by mouth daily. 90 tablet 0    Magnesium Oxide -Mg Supplement 250 MG TABS Take 1 tablet by mouth daily.     metFORMIN (GLUCOPHAGE) 1000 MG tablet Take 1 tablet (1,000 mg total) by mouth 2 (two) times daily. 180 tablet 1   omeprazole (PRILOSEC) 20 MG capsule Take 1 capsule (20 mg total) by mouth 2 (two) times daily before a meal for 14 days. 28 capsule 0   rosuvastatin (CRESTOR) 20 MG tablet Take 1 tablet (20 mg total) by mouth daily. 90 tablet 1   Semaglutide, 1 MG/DOSE, (OZEMPIC, 1 MG/DOSE,) 4 MG/3ML SOPN Inject 1 mg into the muscle once a week. 9 mL 1   Vitamin D, Ergocalciferol, (DRISDOL) 1.25 MG (50000 UNIT) CAPS capsule TAKE 1 CAPSULE 1 TIME A WEEK 12 capsule 0   No current facility-administered medications for this visit.     PHYSICAL EXAMINATION: ECOG PERFORMANCE STATUS: 1 - Symptomatic but completely ambulatory Vitals:   02/02/23 0949  BP: 108/67  Pulse: 64  Resp: 18  Temp: (!) 97.1 F (36.2 C)   Filed Weights   02/02/23 0949  Weight: 193 lb 1.6 oz (87.6 kg)    Physical Exam Constitutional:      General: He is not in acute distress. HENT:     Head: Normocephalic and atraumatic.  Eyes:     General: No scleral icterus. Cardiovascular:     Rate and Rhythm: Normal rate and regular rhythm.     Heart sounds: Normal heart sounds.  Pulmonary:     Effort: Pulmonary effort is normal. No respiratory distress.     Breath sounds: No wheezing.  Abdominal:     General: Bowel sounds are normal. There is no distension.     Palpations: Abdomen is soft.  Musculoskeletal:        General: No deformity. Normal range of motion.     Cervical back: Normal range of motion and neck supple.  Skin:    General: Skin is warm and dry.     Findings: No erythema or rash.  Neurological:     Mental Status: He is alert and oriented to person, place, and time. Mental status is at baseline.     Cranial Nerves: No cranial nerve deficit.     Coordination: Coordination normal.  Psychiatric:        Mood and Affect: Mood normal.      LABORATORY DATA:  I have reviewed the data as listed    Latest Ref Rng & Units 02/01/2023    9:23 AM 09/23/2022    9:27 AM 07/28/2022  1:54 PM  CBC  WBC 4.0 - 10.5 K/uL 6.0  7.3  7.6   Hemoglobin 13.0 - 17.0 g/dL 16.1  09.6  04.5   Hematocrit 39.0 - 52.0 % 42.4  41.2  46.0   Platelets 150 - 400 K/uL 291  324  295       Latest Ref Rng & Units 09/23/2022    9:27 AM 04/04/2022    7:04 AM 11/18/2021   11:05 AM  CMP  Glucose 70 - 99 mg/dL 409  811  93   BUN 8 - 27 mg/dL 10  11  12    Creatinine 0.76 - 1.27 mg/dL 9.14  7.82  9.56   Sodium 134 - 144 mmol/L 138  132  137   Potassium 3.5 - 5.2 mmol/L 4.4  3.7  4.6   Chloride 96 - 106 mmol/L 103  100  100   CO2 20 - 29 mmol/L 22  22  22    Calcium 8.6 - 10.2 mg/dL 9.2  9.1  9.5   Total Protein 6.0 - 8.5 g/dL 7.1  8.2  7.4   Total Bilirubin 0.0 - 1.2 mg/dL 0.3  1.1  0.2   Alkaline Phos 44 - 121 IU/L 70  64  75   AST 0 - 40 IU/L 17  17  14    ALT 0 - 44 IU/L 20  21  16     Lab Results  Component Value Date   IRON 101 02/01/2023   TIBC 414 02/01/2023   FERRITIN 32 02/01/2023       RADIOGRAPHIC STUDIES: I have personally reviewed the radiological images as listed and agreed with the findings in the report. No results found.

## 2023-02-02 NOTE — Assessment & Plan Note (Addendum)
Iron deficiency anemia secondary to chronic GI blood loss. Previously tolerated IV Venofer treatments Labs reviewed and discussed patient Hemoglobin has normalized.  Iron panel has been stable.  Lab Results  Component Value Date   IRON 101 02/01/2023   TIBC 414 02/01/2023   IRONPCTSAT 24 02/01/2023   FERRITIN 32 02/01/2023     No need for IV venfoer or oral iron supplementation.

## 2023-02-16 DIAGNOSIS — Z0100 Encounter for examination of eyes and vision without abnormal findings: Secondary | ICD-10-CM | POA: Diagnosis not present

## 2023-02-16 DIAGNOSIS — H524 Presbyopia: Secondary | ICD-10-CM | POA: Diagnosis not present

## 2023-02-16 LAB — HM DIABETES EYE EXAM

## 2023-03-17 ENCOUNTER — Other Ambulatory Visit: Payer: Self-pay | Admitting: Physician Assistant

## 2023-03-17 ENCOUNTER — Other Ambulatory Visit: Payer: Self-pay | Admitting: Nurse Practitioner

## 2023-03-17 DIAGNOSIS — E119 Type 2 diabetes mellitus without complications: Secondary | ICD-10-CM

## 2023-03-17 NOTE — Telephone Encounter (Signed)
Requested Prescriptions  Pending Prescriptions Disp Refills   gabapentin (NEURONTIN) 300 MG capsule [Pharmacy Med Name: GABAPENTIN 300 MG CAPSULE] 270 capsule 0    Sig: Take 1 capsule (300 mg total) by mouth 3 (three) times daily.     Neurology: Anticonvulsants - gabapentin Passed - 03/17/2023  9:33 AM      Passed - Cr in normal range and within 360 days    Creatinine  Date Value Ref Range Status  01/10/2014 0.82 0.60 - 1.30 mg/dL Final   Creatinine, Ser  Date Value Ref Range Status  09/23/2022 0.83 0.76 - 1.27 mg/dL Final         Passed - Completed PHQ-2 or PHQ-9 in the last 360 days      Passed - Valid encounter within last 12 months    Recent Outpatient Visits           5 months ago Encounter for annual wellness exam in Medicare patient   [place] [facility] [name], NP   8 months ago Primary hypertension   [place] [name] Mecum, [name], PA-C   1 year ago Primary hypertension   [place] [name] Mecum, [name], PA-C   1 year ago Primary hypertension   [place] [name] Vigg, Avanti, MD   1 year ago Anemia, unspecified type   [place] [name] Vigg, Roma Schanz, MD       Future Appointments             In 1 week [name], NP [place] [facility]

## 2023-03-17 NOTE — Telephone Encounter (Signed)
Requested Prescriptions  Pending Prescriptions Disp Refills   JARDIANCE 25 MG TABS tablet [Pharmacy Med Name: JARDIANCE 25 MG TABLET] 90 tablet 0    Sig: Take 1 tablet (25 mg total) by mouth daily.     Endocrinology:  Diabetes - SGLT2 Inhibitors Passed - 03/17/2023  9:33 AM      Passed - Cr in normal range and within 360 days    Creatinine  Date Value Ref Range Status  01/10/2014 0.82 0.60 - 1.30 mg/dL Final   Creatinine, Ser  Date Value Ref Range Status  09/23/2022 0.83 0.76 - 1.27 mg/dL Final         Passed - HBA1C is between 0 and 7.9 and within 180 days    HB A1C (BAYER DCA - WAIVED)  Date Value Ref Range Status  12/30/2021 6.2 (H) 4.8 - 5.6 % Final    Comment:             Prediabetes: 5.7 - 6.4          Diabetes: >6.4          Glycemic control for adults with diabetes: <7.0    Hgb A1c MFr Bld  Date Value Ref Range Status  09/23/2022 7.7 (H) 4.8 - 5.6 % Final    Comment:             Prediabetes: 5.7 - 6.4          Diabetes: >6.4          Glycemic control for adults with diabetes: <7.0          Passed - eGFR in normal range and within 360 days    EGFR (African American)  Date Value Ref Range Status  01/10/2014 >60  Final   GFR calc Af Amer  Date Value Ref Range Status  12/09/2019 >60 >60 mL/min Final   EGFR (Non-African Amer.)  Date Value Ref Range Status  01/10/2014 >60  Final    Comment:    eGFR values <63mL/min/1.73 m2 may be an indication of chronic kidney disease (CKD). Calculated eGFR is useful in patients with stable renal function. The eGFR calculation will not be reliable in acutely ill patients when serum creatinine is changing rapidly. It is not useful in  patients on dialysis. The eGFR calculation may not be applicable to patients at the low and high extremes of body sizes, pregnant women, and vegetarians.    GFR, Estimated  Date Value Ref Range Status  04/04/2022 >60 >60 mL/min Final    Comment:    (NOTE) Calculated using the CKD-EPI  Creatinine Equation (2021)    eGFR  Date Value Ref Range Status  09/23/2022 99 >59 mL/min/1.73 Final         Passed - Valid encounter within last 6 months    Recent Outpatient Visits           5 months ago Encounter for annual wellness exam in Medicare patient   [place] [facility] [name], NP   8 months ago Primary hypertension   [place] [name] Mecum, [name], PA-C   1 year ago Primary hypertension   [place] [name] Mecum, [name], PA-C   1 year ago Primary hypertension   [place] [name] Vigg, Avanti, MD   1 year ago Anemia, unspecified type   [facility] [name], MD       Future Appointments  In 1 week [name], NP [place] [facility], [facility]

## 2023-03-21 ENCOUNTER — Other Ambulatory Visit: Payer: Self-pay | Admitting: Nurse Practitioner

## 2023-03-22 NOTE — Telephone Encounter (Signed)
Requested Prescriptions  Pending Prescriptions Disp Refills   ACCU-CHEK AVIVA PLUS test strip [Pharmacy Med Name: ACCU-CHEK AVIVA PLUS TEST STRP] 300 strip 1    Sig: USE 1 THREE TIMES A DAY     Endocrinology: Diabetes - Testing Supplies Passed - 03/22/2023 10:26 AM      Passed - Valid encounter within last 12 months    Recent Outpatient Visits           6 months ago Encounter for annual wellness exam in Medicare patient   [place] [facility] [name], NP   9 months ago Primary hypertension   [place] [name] Mecum, [name], PA-C   1 year ago Primary hypertension   [place] [name] Mecum, [name], PA-C   1 year ago Primary hypertension   [place] [name] Vigg, Avanti, MD   1 year ago Anemia, unspecified type   [place] [name] Vigg, Avanti, MD       Future Appointments             In 3 days [name], NP [place] [name]             Accu-Chek Softclix Lancets lancets [place] Med Name: ACCU-CHEK SOFTCLIX LANCETS] 300 each 2    Sig: USE 1 LANCET THREE TIMES A DAY     Endocrinology: Diabetes - Testing Supplies Passed - 03/22/2023 10:26 AM      Passed - Valid encounter within last 12 months    Recent Outpatient Visits           6 months ago Encounter for annual wellness exam in Medicare patient   [facility] [name], NP   9 months ago Primary hypertension   [place] [name] Mecum, [name], PA-C   1 year ago Primary hypertension   [place] [name] Mecum, [name], PA-C   1 year ago Primary hypertension   [place] [name] Vigg, Avanti, MD   1 year ago Anemia, unspecified type    [name] Vigg, Avanti, MD       Future Appointments             In 3 days [name], NP  [facility], [facility]

## 2023-03-22 NOTE — Telephone Encounter (Signed)
Patient came in requesting Test strips and Lancets refill to [facility].Sending message to provider [name], NP. Ask patient to allow 48-72 hours. Call patient when complete.

## 2023-03-23 NOTE — Progress Notes (Signed)
Established Patient Office Visit  Name: Shane Preston   MRN: [id number]    DOB: 1960-12-24   Date:03/28/2023  Today's Provider: Jacquelin Hawking, MHS, PA-C Introduced myself to the patient as a PA-C and provided education on APPs in clinical practice.         Subjective  Chief Complaint  Chief Complaint  Patient presents with   Hypertension   Hyperlipidemia   Diabetes    HPI  HYPERTENSION / HYPERLIPIDEMIA Satisfied with current treatment? yes Duration of hypertension: years BP monitoring frequency: a few times a week BP range: 120s-130/60s BP medication side effects: no Past BP meds: carvedilol and lisinopril-HCTZ Duration of hyperlipidemia: chronic Cholesterol medication side effects: no Cholesterol supplements: none Past cholesterol medications: rosuvastatin (crestor) Medication compliance: excellent compliance Aspirin: yes Recent stressors: no Recurrent headaches: no Visual changes: yes- reports floaters  Palpitations: no Dyspnea: no Chest pain: no Lower extremity edema: no Dizzy/lightheaded: no  Diabetes, Type 2 - Last A1c 7.7 in Dec 2023 - Medications: Ozempic 1 mg, Metformin 1000 mg PO BID, Jardiance 25 mg PO QD - Compliance: good compliance  - Checking BG at home: 120-140 sometimes gets up to 150. Checking 3 times per day  - Diet: Sandwiches, eats a lot of cucumbers, burritos sometimes  - Exercise: He is walking for one hour every day  - Eye exam: Discussed importance of annual eye exams - patient is being seen tomorrow at [facility]  - Foot exam: UTD - Microalbumin: UTD - Statin: On Rosuvastatin 20 mg PO QD  - PNA vaccine: NA - Denies symptoms of hypoglycemia, polyuria, polydipsia, numbness extremities, foot ulcers/trauma   HYPOTHYROIDISM Thyroid control status:controlled Satisfied with current treatment? yes Medication side effects: no Medication compliance: excellent compliance Etiology of hypothyroidism:  Recent dose  adjustment:no Fatigue: no Cold intolerance: no Heat intolerance: no Weight gain: no Weight loss: yes- intentional  Constipation: no Diarrhea/loose stools: no Palpitations: no Lower extremity edema: no Anxiety/depressed mood: no   Patient Active Problem List   Diagnosis Date Noted   Gastroesophageal reflux disease 03/24/2023   Grade I hemorrhoids    Iron deficiency anemia 10/28/2021   Rectal bleeding 10/28/2021   Bilateral hearing loss 10/28/2021   Back pain 05/14/2021   Diabetes mellitus without complication (HCC) 05/14/2021   Acquired hypothyroidism 01/15/2019   Mixed hyperlipidemia 02/24/2016   Peripheral sensory neuropathy due to type 2 diabetes mellitus (HCC) 02/24/2016   Chest pain- hx of Nl cors 2013, negative Myoview 4/15 02/06/2014   HTN (hypertension) 02/06/2014   Normal coronary arteries- Feb 2012 02/06/2014   Obesity- BMI 38 02/06/2014    Past Surgical History:  Procedure Laterality Date   BIOPSY N/A 04/20/2022   Procedure: BIOPSY;  Surgeon: Toney Reil, MD;  Location: [facility];  Service: Endoscopy;  Laterality: N/A;   CARDIAC CATHETERIZATION     [facility]    COLONOSCOPY WITH PROPOFOL N/A 10/30/2021   Procedure: COLONOSCOPY WITH PROPOFOL;  Surgeon: Toney Reil, MD;  Location: [facility];  Service: Gastroenterology;  Laterality: N/A;   COLONOSCOPY WITH PROPOFOL N/A 04/20/2022   Procedure: COLONOSCOPY WITH PROPOFOL;  Surgeon: Toney Reil, MD;  Location[facility];  Service: Endoscopy;  Laterality: N/A;   ESOPHAGOGASTRODUODENOSCOPY (EGD) WITH PROPOFOL N/A 04/20/2022   Procedure: ESOPHAGOGASTRODUODENOSCOPY (EGD) WITH PROPOFOL;  Surgeon: Toney Reil, MD;  Location: [facility];  Service: Endoscopy;  Laterality: N/A;   EYE SURGERY     HERNIA REPAIR  10/11/2005   lumbar decompression surgery  10/11/2008    Family History  Family history unknown: Yes    Social History   Tobacco Use   Smoking  status: Never   Smokeless tobacco: Never  Substance Use Topics   Alcohol use: Not Currently     Current Outpatient Medications:    Accu-Chek Softclix Lancets lancets, USE 1 LANCET THREE TIMES A DAY, Disp: 300 each, Rfl: 2   aspirin EC 81 MG tablet, Take 81 mg by mouth daily. Swallow whole., Disp: , Rfl:    cyanocobalamin (VITAMIN B12) 1000 MCG tablet, Take 1,000 mcg by mouth daily., Disp: , Rfl:    gabapentin (NEURONTIN) 300 MG capsule, Take 1 capsule (300 mg total) by mouth 3 (three) times daily., Disp: 270 capsule, Rfl: 0   glucose blood (ACCU-CHEK AVIVA PLUS) test strip, USE 1 THREE TIMES A DAY, Disp: 300 strip, Rfl: 1   JARDIANCE 25 MG TABS tablet, Take 1 tablet (25 mg total) by mouth daily., Disp: 90 tablet, Rfl: 0   levothyroxine (SYNTHROID) 25 MCG tablet, Take 1 tablet (25 mcg total) by mouth every morning., Disp: 90 tablet, Rfl: 3   Magnesium Oxide -Mg Supplement 250 MG TABS, Take 1 tablet by mouth daily., Disp: , Rfl:    metFORMIN (GLUCOPHAGE) 1000 MG tablet, Take 1 tablet (1,000 mg total) by mouth 2 (two) times daily., Disp: 180 tablet, Rfl: 1   rosuvastatin (CRESTOR) 20 MG tablet, Take 1 tablet (20 mg total) by mouth daily., Disp: 90 tablet, Rfl: 1   Semaglutide, 1 MG/DOSE, (OZEMPIC, 1 MG/DOSE,) 4 MG/3ML SOPN, Inject 1 mg into the muscle once a week., Disp: 9 mL, Rfl: 1   carvedilol (COREG) 25 MG tablet, Take 1 tablet (25 mg total) by mouth 2 (two) times daily., Disp: 180 tablet, Rfl: 1   lisinopril-hydrochlorothiazide (ZESTORETIC) 20-12.5 MG tablet, Take 1 tablet by mouth daily., Disp: 90 tablet, Rfl: 0   omeprazole (PRILOSEC) 20 MG capsule, Take 1 capsule (20 mg total) by mouth 2 (two) times daily before a meal for 14 days., Disp: 28 capsule, Rfl: 0   Vitamin D, Ergocalciferol, (DRISDOL) 1.25 MG (50000 UNIT) CAPS capsule, TAKE 1 CAPSULE 1 TIME A WEEK, Disp: 12 capsule, Rfl: 0  No Known Allergies  I personally reviewed active problem list, medication list, allergies, health  maintenance, notes from last encounter, lab results with the patient/caregiver today.   ROS  See HPI for relevant ROS   Objective  Vitals:   03/24/23 0916  BP: 115/74  Pulse: (!) 53  Temp: 97.8 F (36.6 C)  TempSrc: Oral  SpO2: 98%  Weight: 191 lb 3.2 oz (86.7 kg)  Height: 5' 4.02" (1.626 m)    Body mass index is 32.8 kg/m.  Physical Exam Vitals reviewed.  Constitutional:      General: He is awake.     Appearance: Normal appearance. He is well-developed and well-groomed.  HENT:     Head: Normocephalic and atraumatic.  Eyes:     General: Lids are normal. Gaze aligned appropriately.     Extraocular Movements: Extraocular movements intact.     Conjunctiva/sclera: Conjunctivae normal.  Neck:     Thyroid: No thyroid mass, thyromegaly or thyroid tenderness.  Cardiovascular:     Rate and Rhythm: Normal rate and regular rhythm.     Pulses: Normal pulses.     Heart sounds: Normal heart sounds. No murmur heard.    No friction rub. No gallop.  Musculoskeletal:     Cervical back:  Normal range of motion and neck supple.     Right lower leg: No edema.     Left lower leg: No edema.  Lymphadenopathy:     Head:     Right side of head: No submental or submandibular adenopathy.     Left side of head: No submental or submandibular adenopathy.     Cervical:     Right cervical: No superficial or posterior cervical adenopathy.    Left cervical: No superficial or posterior cervical adenopathy.  Neurological:     Mental Status: He is alert.  Psychiatric:        Behavior: Behavior is cooperative.      Recent Results (from the past 2160 hour(s))  Ferritin     Status: None   Collection Time: 02/01/23  9:23 AM  Result Value Ref Range   Ferritin 32 24 - 336 ng/mL    Comment: Performed at [facility], [address] [place], [place] [address]  Iron and TIBC     Status: None   Collection Time: 02/01/23  9:23 AM  Result Value Ref Range   Iron 101 45 - 182 ug/dL    TIBC 119 147 - 829 ug/dL   Saturation Ratios 24 17.9 - 39.5 %   UIBC 313 ug/dL    Comment: Performed at [facility], [address] [place], [place] [address]  CBC with Differential/Platelet     Status: None   Collection Time: 02/01/23  9:23 AM  Result Value Ref Range   WBC 6.0 4.0 - 10.5 K/uL   RBC 4.79 4.22 - 5.81 MIL/uL   Hemoglobin 14.3 13.0 - 17.0 g/dL   HCT 08.6 57.8 - 46.9 %   MCV 88.5 80.0 - 100.0 fL   MCH 29.9 26.0 - 34.0 pg   MCHC 33.7 30.0 - 36.0 g/dL   RDW 62.9 52.8 - 41.3 %   Platelets 291 150 - 400 K/uL   nRBC 0.0 0.0 - 0.2 %   Neutrophils Relative % 61 %   Neutro Abs 3.6 1.7 - 7.7 K/uL   Lymphocytes Relative 30 %   Lymphs Abs 1.8 0.7 - 4.0 K/uL   Monocytes Relative 7 %   Monocytes Absolute 0.4 0.1 - 1.0 K/uL   Eosinophils Relative 2 %   Eosinophils Absolute 0.1 0.0 - 0.5 K/uL   Basophils Relative 0 %   Basophils Absolute 0.0 0.0 - 0.1 K/uL   Immature Granulocytes 0 %   Abs Immature Granulocytes 0.02 0.00 - 0.07 K/uL    Comment: Performed at [facility], [address] [place], [place] [address]  Comp Met (CMET)     Status: Abnormal   Collection Time: 03/24/23  9:49 AM  Result Value Ref Range   Glucose 115 (H) 70 - 99 mg/dL   BUN 13 8 - 27 mg/dL   Creatinine, Ser 0.27 0.76 - 1.27 mg/dL   eGFR 95 >25 DG/UYQ/0.34   BUN/Creatinine Ratio 14 10 - 24   Sodium 140 134 - 144 mmol/L   Potassium 4.3 3.5 - 5.2 mmol/L   Chloride 101 96 - 106 mmol/L   CO2 23 20 - 29 mmol/L   Calcium 9.9 8.6 - 10.2 mg/dL   Total Protein 7.4 6.0 - 8.5 g/dL   Albumin 4.7 3.9 - 4.9 g/dL   Globulin, Total 2.7 1.5 - 4.5 g/dL   Albumin/Globulin Ratio 1.7    Bilirubin Total 0.5 0.0 - 1.2 mg/dL   Alkaline Phosphatase 68 44 - 121  IU/L   AST 20 0 - 40 IU/L   ALT 19 0 - 44 IU/L  CBC w/Diff     Status: None   Collection Time: 03/24/23  9:49 AM  Result Value Ref Range   WBC 5.8 3.4 - 10.8 x10E3/uL   RBC 4.77 4.14 - 5.80 x10E6/uL   Hemoglobin 14.5 13.0 - 17.7 g/dL    Hematocrit 47.8 29.5 - 51.0 %   MCV 93 79 - 97 fL   MCH 30.4 26.6 - 33.0 pg   MCHC 32.8 31.5 - 35.7 g/dL   RDW 62.1 30.8 - 65.7 %   Platelets 275 150 - 450 x10E3/uL   Neutrophils 59 Not Estab. %   Lymphs 30 Not Estab. %   Monocytes 7 Not Estab. %   Eos 3 Not Estab. %   Basos 1 Not Estab. %   Neutrophils Absolute 3.5 1.4 - 7.0 x10E3/uL   Lymphocytes Absolute 1.8 0.7 - 3.1 x10E3/uL   Monocytes Absolute 0.4 0.1 - 0.9 x10E3/uL   EOS (ABSOLUTE) 0.2 0.0 - 0.4 x10E3/uL   Basophils Absolute 0.0 0.0 - 0.2 x10E3/uL   Immature Granulocytes 0 Not Estab. %   Immature Grans (Abs) 0.0 0.0 - 0.1 x10E3/uL  Lipid Profile     Status: None   Collection Time: 03/24/23  9:49 AM  Result Value Ref Range   Cholesterol, Total 120 100 - 199 mg/dL   Triglycerides 846 0 - 149 mg/dL   HDL 45 >96 mg/dL   VLDL Cholesterol Cal 22 5 - 40 mg/dL   LDL Chol Calc (NIH) 53 0 - 99 mg/dL   Chol/HDL Ratio 2.7 0.0 - 5.0 ratio    Comment:                                   T. Chol/HDL Ratio                                             Men  Women                               1/2 Avg.Risk  3.4    3.3                                   Avg.Risk  5.0    4.4                                2X Avg.Risk  9.6    7.1                                3X Avg.Risk 23.4   11.0   HgB A1c     Status: Abnormal   Collection Time: 03/24/23  9:49 AM  Result Value Ref Range   Hgb A1c MFr Bld 7.3 (H) 4.8 - 5.6 %    Comment:          Prediabetes: 5.7 - 6.4          Diabetes: >6.4  Glycemic control for adults with diabetes: <7.0    Est. average glucose Bld gHb Est-mCnc 163 mg/dL  Vitamin D (25 hydroxy)     Status: None   Collection Time: 03/24/23  9:49 AM  Result Value Ref Range   Vit D, 25-Hydroxy 33.4 30.0 - 100.0 ng/mL    Comment: Vitamin D deficiency has been defined by the Institute of Medicine and an Endocrine Society practice guideline as a level of serum 25-OH vitamin D less than 20 ng/mL (1,2). The Endocrine Society went  on to further define vitamin D insufficiency as a level between 21 and 29 ng/mL (2). 1. IOM (Institute of Medicine). 2010. Dietary reference    intakes for calcium and D. [place]: The    [employer]. 2. Holick MF, Binkley Hallett, Bischoff-Ferrari HA, et al.    Evaluation, treatment, and prevention of vitamin D    deficiency: an Endocrine Society clinical practice    guideline. JCEM. 2011 Jul; 96(7):1911-30.   Thyroid Panel With TSH     Status: None   Collection Time: 03/24/23  9:49 AM  Result Value Ref Range   TSH 3.610 0.450 - 4.500 uIU/mL   T4, Total 8.6 4.5 - 12.0 ug/dL   T3 Uptake Ratio 29 24 - 39 %   Free Thyroxine Index 2.5 1.2 - 4.9  Specimen status report     Status: None (Preliminary result)   Collection Time: 03/24/23  9:49 AM  Result Value Ref Range   specimen status report Comment     Comment: Written Authorization Written Authorization      PHQ2/9:    03/24/2023    9:21 AM 09/23/2022    9:11 AM 06/24/2022   10:49 AM 03/15/2022   10:19 AM 11/18/2021   10:42 AM  Depression screen PHQ 2/9  Decreased Interest 0 0 0 0 0  Down, Depressed, Hopeless 0 0 0 0 0  PHQ - 2 Score 0 0 0 0 0  Altered sleeping 0 0 0 0 0  Tired, decreased energy 0 0 0 0 0  Change in appetite 0 0 0 0 0  Feeling bad or failure about yourself  0 0 0 0 0  Trouble concentrating 0 0 0 0 0  Moving slowly or fidgety/restless 0 0 0 0 0  Suicidal thoughts 0 0 0 0 0  PHQ-9 Score 0 0 0 0 0  Difficult doing work/chores Not difficult at all Not difficult at all Not difficult at all Not difficult at all Not difficult at all      Fall Risk:    03/24/2023    9:21 AM 09/23/2022    9:11 AM 06/24/2022   10:49 AM 03/15/2022   10:19 AM 11/18/2021   10:41 AM  Fall Risk   Falls in the past year? 0 0 0 0 0  Number falls in past yr: 0 0 0 0 0  Injury with Fall? 0 0 0 0 0  Risk for fall due to : No Fall Risks No Fall Risks No Fall Risks No Fall Risks No Fall Risks  Follow up Falls evaluation  completed Falls evaluation completed Falls evaluation completed Falls evaluation completed Falls evaluation completed      Functional Status Survey:      Assessment & Plan  Problem List Items Addressed This Visit       Cardiovascular and Mediastinum   HTN (hypertension) (Chronic)    Chronic, historic condition Appears to be well managed on current regimen of Carvedilol  25 mg PO every day,  Lisinopril- hydrochlorothiazide 20-12.5 mg PO every day  He appears to be tolerating current regimen without issues Continue current regimen Follow up in 6 months or sooner if concerns arise      Relevant Medications   carvedilol (COREG) 25 MG tablet   lisinopril-hydrochlorothiazide (ZESTORETIC) 20-12.5 MG tablet   Other Relevant Orders   CBC w/Diff (Completed)     Digestive   Gastroesophageal reflux disease    Chronic, historic condition Appears well managed on current regimen of Omeprazole 20 mg PO every day  Continue current regimen Follow up as needed for persistent or progressing symptoms        Relevant Medications   omeprazole (PRILOSEC) 20 MG capsule     Endocrine   Acquired hypothyroidism    Chronic, historic condition Currently managed with Levothyroxine 25 mcg PO every day and appears to be tolerating well He denies thyroid concerns today  Continue current regimen pending lab results Recheck thyroid panel today Follow up in 6 months or sooner if concerns arise       Relevant Medications   carvedilol (COREG) 25 MG tablet   Other Relevant Orders   Comp Met (CMET) (Completed)   Thyroid Panel With TSH   Peripheral sensory neuropathy due to type 2 diabetes mellitus (HCC)   Relevant Medications   lisinopril-hydrochlorothiazide (ZESTORETIC) 20-12.5 MG tablet   Diabetes mellitus without complication (HCC) - Primary    Chronic, historic condition.  Most recent A1c was 7.7 in Dec 2023 Currently taking Jardiance 25 mg PO QD, Metformin 1000 mg PO BID and Semaglutide 1  mg/ dose IM once per week Recheck A1c today  Results to dictate further management Continue current regimen pending results Follow up in 3 months for monitoring       Relevant Medications   lisinopril-hydrochlorothiazide (ZESTORETIC) 20-12.5 MG tablet   Other Relevant Orders   HgB A1c (Completed)     Other   Mixed hyperlipidemia    Chronic, historic condition Appears stable and well managed with Rosuvastatin 20 mg PO QD  Continue current medications pending lab results Recheck lipid panel today - results to dictate further management Recommend he continues with walking and makes dietary changes to assist with diabetes and cholesterol Follow up in 6 months or sooner if concerns arise         Relevant Medications   carvedilol (COREG) 25 MG tablet   lisinopril-hydrochlorothiazide (ZESTORETIC) 20-12.5 MG tablet   Other Relevant Orders   Lipid Profile (Completed)   Other Visit Diagnoses     H/O vitamin D deficiency       Labs ordered today.  Will make recommendations based on lab results.   Relevant Medications   Vitamin D, Ergocalciferol, (DRISDOL) 1.25 MG (50000 UNIT) CAPS capsule   Other Relevant Orders   Vitamin D (25 hydroxy) (Completed)        Return in about 3 months (around 06/24/2023) for HTN, Diabetes follow up, HLD .   I, Samah Lapiana E Ingram Onnen, PA-C, have reviewed all documentation for this visit. The documentation on 03/28/23 for the exam, diagnosis, procedures, and orders are all accurate and complete.   Jacquelin Hawking, MHS, PA-C [facility]

## 2023-03-24 ENCOUNTER — Encounter: Payer: Self-pay | Admitting: Physician Assistant

## 2023-03-24 ENCOUNTER — Ambulatory Visit (INDEPENDENT_AMBULATORY_CARE_PROVIDER_SITE_OTHER): Payer: Medicare HMO | Admitting: Physician Assistant

## 2023-03-24 VITALS — BP 115/74 | HR 53 | Temp 97.8°F | Ht 64.02 in | Wt 191.2 lb

## 2023-03-24 DIAGNOSIS — Z8639 Personal history of other endocrine, nutritional and metabolic disease: Secondary | ICD-10-CM | POA: Diagnosis not present

## 2023-03-24 DIAGNOSIS — E039 Hypothyroidism, unspecified: Secondary | ICD-10-CM | POA: Diagnosis not present

## 2023-03-24 DIAGNOSIS — E1142 Type 2 diabetes mellitus with diabetic polyneuropathy: Secondary | ICD-10-CM

## 2023-03-24 DIAGNOSIS — E119 Type 2 diabetes mellitus without complications: Secondary | ICD-10-CM

## 2023-03-24 DIAGNOSIS — K219 Gastro-esophageal reflux disease without esophagitis: Secondary | ICD-10-CM

## 2023-03-24 DIAGNOSIS — E782 Mixed hyperlipidemia: Secondary | ICD-10-CM | POA: Diagnosis not present

## 2023-03-24 DIAGNOSIS — I10 Essential (primary) hypertension: Secondary | ICD-10-CM | POA: Diagnosis not present

## 2023-03-24 DIAGNOSIS — Z7984 Long term (current) use of oral hypoglycemic drugs: Secondary | ICD-10-CM | POA: Diagnosis not present

## 2023-03-24 MED ORDER — LISINOPRIL-HYDROCHLOROTHIAZIDE 20-12.5 MG PO TABS
1.0000 | ORAL_TABLET | Freq: Every day | ORAL | 0 refills | Status: DC
Start: 2023-03-24 — End: 2023-06-24

## 2023-03-24 MED ORDER — CARVEDILOL 25 MG PO TABS: 25.0000 mg | ORAL_TABLET | Freq: Two times a day (BID) | ORAL | 1 refills | Status: DC

## 2023-03-24 MED ORDER — OMEPRAZOLE 20 MG PO CPDR
20.0000 mg | DELAYED_RELEASE_CAPSULE | Freq: Two times a day (BID) | ORAL | 0 refills | Status: AC
Start: 2023-03-24 — End: 2023-12-08

## 2023-03-24 MED ORDER — VITAMIN D (ERGOCALCIFEROL) 1.25 MG (50000 UNIT) PO CAPS: ORAL_CAPSULE | ORAL | 0 refills | Status: DC

## 2023-03-24 NOTE — Patient Instructions (Signed)
  Please have your eye doctor's office send the results of your eye exam to [place]. Make sure they know to complete a diabetic eye exam on you to check for diabetic retinopathy.

## 2023-03-25 ENCOUNTER — Ambulatory Visit: Payer: Medicare HMO | Admitting: Nurse Practitioner

## 2023-03-25 LAB — COMPREHENSIVE METABOLIC PANEL
ALT: 19 IU/L (ref 0–44)
AST: 20 IU/L (ref 0–40)
Albumin/Globulin Ratio: 1.7
Albumin: 4.7 g/dL (ref 3.9–4.9)
Alkaline Phosphatase: 68 IU/L (ref 44–121)
BUN/Creatinine Ratio: 14 (ref 10–24)
BUN: 13 mg/dL (ref 8–27)
Bilirubin Total: 0.5 mg/dL (ref 0.0–1.2)
CO2: 23 mmol/L (ref 20–29)
Calcium: 9.9 mg/dL (ref 8.6–10.2)
Chloride: 101 mmol/L (ref 96–106)
Creatinine, Ser: 0.91 mg/dL (ref 0.76–1.27)
Globulin, Total: 2.7 g/dL (ref 1.5–4.5)
Glucose: 115 mg/dL — ABNORMAL HIGH (ref 70–99)
Potassium: 4.3 mmol/L (ref 3.5–5.2)
Sodium: 140 mmol/L (ref 134–144)
Total Protein: 7.4 g/dL (ref 6.0–8.5)
eGFR: 95 mL/min/{1.73_m2} (ref >59)

## 2023-03-25 LAB — HEMOGLOBIN A1C
Est. average glucose Bld gHb Est-mCnc: 163 mg/dL
Hgb A1c MFr Bld: 7.3 % — ABNORMAL HIGH (ref 4.8–5.6)

## 2023-03-25 LAB — LIPID PANEL
Chol/HDL Ratio: 2.7 ratio (ref 0.0–5.0)
Cholesterol, Total: 120 mg/dL (ref 100–199)
HDL: 45 mg/dL (ref >39)
LDL Chol Calc (NIH): 53 mg/dL (ref 0–99)
Triglycerides: 127 mg/dL (ref 0–149)
VLDL Cholesterol Cal: 22 mg/dL (ref 5–40)

## 2023-03-25 LAB — VITAMIN D 25 HYDROXY (VIT D DEFICIENCY, FRACTURES): Vit D, 25-Hydroxy: 33.4 ng/mL (ref 30.0–100.0)

## 2023-03-25 LAB — CBC WITH DIFFERENTIAL/PLATELET
Basophils Absolute: 0 10*3/uL (ref 0.0–0.2)
Basos: 1 %
EOS (ABSOLUTE): 0.2 10*3/uL (ref 0.0–0.4)
Eos: 3 %
Hematocrit: 44.2 % (ref 37.5–51.0)
Hemoglobin: 14.5 g/dL (ref 13.0–17.7)
Immature Grans (Abs): 0 10*3/uL (ref 0.0–0.1)
Immature Granulocytes: 0 %
Lymphocytes Absolute: 1.8 10*3/uL (ref 0.7–3.1)
Lymphs: 30 %
MCH: 30.4 pg (ref 26.6–33.0)
MCHC: 32.8 g/dL (ref 31.5–35.7)
MCV: 93 fL (ref 79–97)
Monocytes Absolute: 0.4 10*3/uL (ref 0.1–0.9)
Monocytes: 7 %
Neutrophils Absolute: 3.5 10*3/uL (ref 1.4–7.0)
Neutrophils: 59 %
Platelets: 275 10*3/uL (ref 150–450)
RBC: 4.77 x10E6/uL (ref 4.14–5.80)
RDW: 13.5 % (ref 11.6–15.4)
WBC: 5.8 10*3/uL (ref 3.4–10.8)

## 2023-03-26 LAB — SPECIMEN STATUS REPORT

## 2023-03-26 LAB — THYROID PANEL WITH TSH: TSH: 3.61 u[IU]/mL (ref 0.450–4.500)

## 2023-03-28 NOTE — Assessment & Plan Note (Signed)
Chronic, historic condition Currently managed with Levothyroxine 25 mcg PO every day and appears to be tolerating well He denies thyroid concerns today  Continue current regimen pending lab results Recheck thyroid panel today Follow up in 6 months or sooner if concerns arise

## 2023-03-28 NOTE — Assessment & Plan Note (Signed)
Chronic, historic condition Appears to be well managed on current regimen of Carvedilol 25 mg PO every day,  Lisinopril- hydrochlorothiazide 20-12.5 mg PO every day  He appears to be tolerating current regimen without issues Continue current regimen Follow up in 6 months or sooner if concerns arise

## 2023-03-28 NOTE — Assessment & Plan Note (Signed)
Chronic, historic condition Appears stable and well managed with Rosuvastatin 20 mg PO QD  Continue current medications pending lab results Recheck lipid panel today - results to dictate further management Recommend he continues with walking and makes dietary changes to assist with diabetes and cholesterol Follow up in 6 months or sooner if concerns arise

## 2023-03-28 NOTE — Assessment & Plan Note (Signed)
Chronic, historic condition.  Most recent A1c was 7.7 in Dec 2023 Currently taking Jardiance 25 mg PO QD, Metformin 1000 mg PO BID and Semaglutide 1 mg/ dose IM once per week Recheck A1c today  Results to dictate further management Continue current regimen pending results Follow up in 3 months for monitoring

## 2023-03-28 NOTE — Assessment & Plan Note (Signed)
Chronic, historic condition Appears well managed on current regimen of Omeprazole 20 mg PO every day  Continue current regimen Follow up as needed for persistent or progressing symptoms

## 2023-03-29 NOTE — Progress Notes (Signed)
Your lab results have returned. Your electrolytes, liver and kidney function are in normal limits and appear to be stable. Your A1c is 7.3.  This is improved from your last result of 7.7.  Please continue with your current management and make sure you are getting plenty of exercise and avoiding excess sugar and carbohydrates. Your thyroid levels appear stable and are in range.  Please continue your current medications. Your blood count is normal and does not show signs of anemia. Your cholesterol looks great and is in goal for someone with diabetes.  Please continue your current medications. Your vitamin D is in normal range but is still a little low.  I would recommend that you continue to supplement and we can recheck this in about 3 months to make sure that it still improving

## 2023-03-31 LAB — THYROID PANEL WITH TSH
Free Thyroxine Index: 2.5 (ref 1.2–4.9)
T3 Uptake Ratio: 29 % (ref 24–39)
T4, Total: 8.6 ug/dL (ref 4.5–12.0)

## 2023-03-31 NOTE — Progress Notes (Signed)
Your thyroid panel was in normal limits. Please continue with your current medication dose.

## 2023-04-06 ENCOUNTER — Other Ambulatory Visit: Payer: Self-pay | Admitting: Nurse Practitioner

## 2023-04-06 DIAGNOSIS — D649 Anemia, unspecified: Secondary | ICD-10-CM

## 2023-04-06 DIAGNOSIS — E119 Type 2 diabetes mellitus without complications: Secondary | ICD-10-CM

## 2023-06-20 ENCOUNTER — Other Ambulatory Visit: Payer: Self-pay | Admitting: Physician Assistant

## 2023-06-20 DIAGNOSIS — Z8639 Personal history of other endocrine, nutritional and metabolic disease: Secondary | ICD-10-CM

## 2023-06-20 DIAGNOSIS — E119 Type 2 diabetes mellitus without complications: Secondary | ICD-10-CM

## 2023-06-21 NOTE — Telephone Encounter (Signed)
Requested Prescriptions  Pending Prescriptions Disp Refills   JARDIANCE 25 MG TABS tablet [Pharmacy Med Name: JARDIANCE 25 MG TABLET] 90 tablet 0    Sig: Take 1 tablet (25 mg total) by mouth daily.     Endocrinology:  Diabetes - SGLT2 Inhibitors Passed - 06/21/2023 10:37 AM      Passed - Cr in normal range and within 360 days    Creatinine  Date Value Ref Range Status  01/10/2014 0.82 0.60 - 1.30 mg/dL Final   Creatinine, Ser  Date Value Ref Range Status  03/24/2023 0.91 0.76 - 1.27 mg/dL Final         Passed - HBA1C is between 0 and 7.9 and within 180 days    HB A1C (BAYER DCA - WAIVED)  Date Value Ref Range Status  12/30/2021 6.2 (H) 4.8 - 5.6 % Final    Comment:             Prediabetes: 5.7 - 6.4          Diabetes: >6.4          Glycemic control for adults with diabetes: <7.0    Hgb A1c MFr Bld  Date Value Ref Range Status  03/24/2023 7.3 (H) 4.8 - 5.6 % Final    Comment:             Prediabetes: 5.7 - 6.4          Diabetes: >6.4          Glycemic control for adults with diabetes: <7.0          Passed - eGFR in normal range and within 360 days    EGFR (African American)  Date Value Ref Range Status  01/10/2014 >60  Final   GFR calc Af Amer  Date Value Ref Range Status  12/09/2019 >60 >60 mL/min Final   EGFR (Non-African Amer.)  Date Value Ref Range Status  01/10/2014 >60  Final    Comment:    eGFR values <51mL/min/1.73 m2 may be an indication of chronic kidney disease (CKD). Calculated eGFR is useful in patients with stable renal function. The eGFR calculation will not be reliable in acutely ill patients when serum creatinine is changing rapidly. It is not useful in  patients on dialysis. The eGFR calculation may not be applicable to patients at the low and high extremes of body sizes, pregnant women, and vegetarians.    GFR, Estimated  Date Value Ref Range Status  04/04/2022 >60 >60 mL/min Final    Comment:    (NOTE) Calculated using the CKD-EPI  Creatinine Equation (2021)    eGFR  Date Value Ref Range Status  03/24/2023 95 >59 mL/min/1.73 Final         Passed - Valid encounter within last 6 months    Recent Outpatient Visits           2 months ago Diabetes mellitus without complication (HCC)   [place] [name] Mecum, [name], PA-C   9 months ago Encounter for annual wellness exam in Medicare patient   [place] [facility] [name], NP   12 months ago Primary hypertension   [place] [name] Mecum, [name], PA-C   1 year ago Primary hypertension   [place] [name] Mecum, [name], PA-C   1 year ago Primary hypertension   [place] [name] Vigg, Avanti, MD       Future Appointments  In 3 days Mecum, [name], PA-C [place] [name]             Vitamin D, Ergocalciferol, 50000 units CAPS [Pharmacy Med Name: VITAMIN D2 1.25MG (50,000 UNIT)] 12 capsule 0    Sig: TAKE 1 CAPSULE 1 TIME A WEEK     Endocrinology:  Vitamins - Vitamin D Supplementation 2 Failed - 06/21/2023 10:37 AM      Failed - Manual Review: Route requests for 50,000 IU strength to the provider      Passed - Ca in normal range and within 360 days    Calcium  Date Value Ref Range Status  03/24/2023 9.9 8.6 - 10.2 mg/dL Final   Calcium, Total  Date Value Ref Range Status  01/10/2014 8.5 8.5 - 10.1 mg/dL Final   Calcium, Ion  Date Value Ref Range Status  11/23/2014 1.19 1.12 - 1.23 mmol/L Final         Passed - Vitamin D in normal range and within 360 days    Vit D, 25-Hydroxy  Date Value Ref Range Status  03/24/2023 33.4 30.0 - 100.0 ng/mL Final    Comment:    Vitamin D deficiency has been defined by the Institute of Medicine and an Endocrine Society practice guideline as a level of serum 25-OH vitamin D less than 20 ng/mL (1,2). The Endocrine Society went on to further define vitamin D insufficiency as a  level between 21 and 29 ng/mL (2). 1. IOM (Institute of Medicine). 2010. Dietary reference    intakes for calcium and D. [place]: The    [employer]. 2. Holick MF, Binkley [place], Bischoff-Ferrari HA, et al.    Evaluation, treatment, and prevention of vitamin D    deficiency: an Endocrine Society clinical practice    guideline. JCEM. 2011 Jul; 96(7):1911-30.          Passed - Valid encounter within last 12 months    Recent Outpatient Visits           2 months ago Diabetes mellitus without complication (HCC)   [place] [name] Mecum, [name], PA-C   9 months ago Encounter for annual wellness exam in Medicare patient   [place] [facility] [name], NP   12 months ago Primary hypertension   [place] [name] Mecum, [name], PA-C   1 year ago Primary hypertension   [place] [name] Mecum, [name], PA-C   1 year ago Primary hypertension   [place] [name] Vigg, Avanti, MD       Future Appointments             In 3 days Mecum, [name], PA-C [place] [facility]

## 2023-06-21 NOTE — Telephone Encounter (Signed)
Patient came in to request refill for Jardiance 25mg  and Vid D. Patient states he is completely out. He has an appt. With Jacquelin Hawking, PA on 06-24-23 @ 10. I am forwarding this to the CMA.

## 2023-06-24 ENCOUNTER — Ambulatory Visit (INDEPENDENT_AMBULATORY_CARE_PROVIDER_SITE_OTHER): Payer: Medicare HMO | Admitting: Physician Assistant

## 2023-06-24 ENCOUNTER — Encounter: Payer: Self-pay | Admitting: Physician Assistant

## 2023-06-24 VITALS — BP 114/68 | HR 57 | Ht 64.0 in | Wt 191.0 lb

## 2023-06-24 DIAGNOSIS — N489 Disorder of penis, unspecified: Secondary | ICD-10-CM | POA: Diagnosis not present

## 2023-06-24 DIAGNOSIS — I10 Essential (primary) hypertension: Secondary | ICD-10-CM

## 2023-06-24 DIAGNOSIS — D649 Anemia, unspecified: Secondary | ICD-10-CM | POA: Diagnosis not present

## 2023-06-24 DIAGNOSIS — E039 Hypothyroidism, unspecified: Secondary | ICD-10-CM | POA: Diagnosis not present

## 2023-06-24 DIAGNOSIS — Z7985 Long-term (current) use of injectable non-insulin antidiabetic drugs: Secondary | ICD-10-CM | POA: Diagnosis not present

## 2023-06-24 DIAGNOSIS — E1140 Type 2 diabetes mellitus with diabetic neuropathy, unspecified: Secondary | ICD-10-CM

## 2023-06-24 DIAGNOSIS — E782 Mixed hyperlipidemia: Secondary | ICD-10-CM | POA: Diagnosis not present

## 2023-06-24 MED ORDER — METFORMIN HCL 1000 MG PO TABS: 1000.0000 mg | ORAL_TABLET | Freq: Two times a day (BID) | ORAL | 1 refills | Status: DC

## 2023-06-24 MED ORDER — GABAPENTIN 300 MG PO CAPS: 300.0000 mg | ORAL_CAPSULE | Freq: Three times a day (TID) | ORAL | 0 refills | Status: DC

## 2023-06-24 MED ORDER — LISINOPRIL-HYDROCHLOROTHIAZIDE 20-12.5 MG PO TABS: 1.0000 | ORAL_TABLET | Freq: Every day | ORAL | 1 refills | Status: DC

## 2023-06-24 MED ORDER — ROSUVASTATIN CALCIUM 20 MG PO TABS: 20.0000 mg | ORAL_TABLET | Freq: Every day | ORAL | 1 refills | Status: DC

## 2023-06-24 MED ORDER — OZEMPIC (1 MG/DOSE) 4 MG/3ML ~~LOC~~ SOPN: 1.0000 mg | PEN_INJECTOR | SUBCUTANEOUS | 1 refills | Status: DC

## 2023-06-24 NOTE — Progress Notes (Signed)
Established Patient Office Visit  Name: Shane Preston   MRN: [id number]    DOB: 18-Jan-1961   Date:06/24/2023  Today's Provider: Jacquelin Hawking, MHS, PA-C Introduced myself to the patient as a PA-C and provided education on APPs in clinical practice.         Subjective  Chief Complaint  Chief Complaint  Patient presents with   Diabetes   Hypertension   Hyperlipidemia   Medication Refill    HPI  Diabetes, Type 2 - Last A1c 7.3 - Medications: Ozempic 1 mg weekly injection, metformin 1000 mg PO BID, Jardiance 25 mg PO every day,  - Compliance: Good  - Checking BG at home: checks at home 3x per day. Fasting is 110s-120s  - Exercise: He is exercising by walking at the park 2 times per week for about 30 minutes each time  - Eye exam: Got his eye exam in [facility] - will request records from [facility]  - Foot exam: completed today  - Microalbumin: UTD - Statin: On statin  - PNA vaccine: NA - Denies symptoms of hypoglycemia, polyuria, polydipsia, numbness extremities, foot ulcers/trauma  HYPERTENSION / HYPERLIPIDEMIA Satisfied with current treatment? yes Duration of hypertension: years BP monitoring frequency: a few times a week BP range: 120s/60s  BP medication side effects: no Past BP meds: carvedilol and lisinopril-HCTZ Duration of hyperlipidemia: years Cholesterol medication side effects: no Cholesterol supplements: none Past cholesterol medications: rosuvastatin (crestor) Medication compliance: good compliance Aspirin: yes Recent stressors: no Recurrent headaches: no Visual changes: no Palpitations: no Dyspnea: no Chest pain: no Lower extremity edema: no Dizzy/lightheaded: no  HYPOTHYROIDISM Thyroid control status:stable Satisfied with current treatment? yes Medication side effects: no Medication compliance: good compliance Etiology of hypothyroidism:  Recent dose adjustment:no Fatigue: no Cold intolerance: no Heat  intolerance: no Weight gain: no Weight loss: yes- has been trying to lose weight  Constipation: no Diarrhea/loose stools: no Palpitations: no Lower extremity edema: no Anxiety/depressed mood: no   Patient Active Problem List   Diagnosis Date Noted   Gastroesophageal reflux disease 03/24/2023   Grade I hemorrhoids    Iron deficiency anemia 10/28/2021   Rectal bleeding 10/28/2021   Bilateral hearing loss 10/28/2021   Back pain 05/14/2021   Type 2 diabetes mellitus with diabetic neuropathy, unspecified (HCC) 05/14/2021   Acquired hypothyroidism 01/15/2019   Mixed hyperlipidemia 02/24/2016   Peripheral sensory neuropathy due to type 2 diabetes mellitus (HCC) 02/24/2016   Chest pain- hx of Nl cors 2013, negative Myoview 4/15 02/06/2014   HTN (hypertension) 02/06/2014   Normal coronary arteries- Feb 2012 02/06/2014   Obesity- BMI 38 02/06/2014    Past Surgical History:  Procedure Laterality Date   BIOPSY N/A 04/20/2022   Procedure: BIOPSY;  Surgeon: Toney Reil, MD;  Location: [facility];  Service: Endoscopy;  Laterality: N/A;   CARDIAC CATHETERIZATION     [facility]    COLONOSCOPY WITH PROPOFOL N/A 10/30/2021   Procedure: COLONOSCOPY WITH PROPOFOL;  Surgeon: Toney Reil, MD;  Location: [facility];  Service: Gastroenterology;  Laterality: N/A;   COLONOSCOPY WITH PROPOFOL N/A 04/20/2022   Procedure: COLONOSCOPY WITH PROPOFOL;  Surgeon: Toney Reil, MD;  Location: [facility];  Service: Endoscopy;  Laterality: N/A;   ESOPHAGOGASTRODUODENOSCOPY (EGD) WITH PROPOFOL N/A 04/20/2022   Procedure: ESOPHAGOGASTRODUODENOSCOPY (EGD) WITH PROPOFOL;  Surgeon: Toney Reil, MD;  Location: [facility];  Service: Endoscopy;  Laterality: N/A;   EYE SURGERY  HERNIA REPAIR  10/11/2005   lumbar decompression surgery  10/11/2008    Family History  Family history unknown: Yes    Social History   Tobacco Use   Smoking status: Never    Smokeless tobacco: Never  Substance Use Topics   Alcohol use: Not Currently     Current Outpatient Medications:    Accu-Chek Softclix Lancets lancets, USE 1 LANCET THREE TIMES A DAY, Disp: 300 each, Rfl: 2   aspirin EC 81 MG tablet, Take 81 mg by mouth daily. Swallow whole., Disp: , Rfl:    carvedilol (COREG) 25 MG tablet, Take 1 tablet (25 mg total) by mouth 2 (two) times daily., Disp: 180 tablet, Rfl: 1   cyanocobalamin (VITAMIN B12) 1000 MCG tablet, Take 1,000 mcg by mouth daily., Disp: , Rfl:    glucose blood (ACCU-CHEK AVIVA PLUS) test strip, USE 1 THREE TIMES A DAY, Disp: 300 strip, Rfl: 1   JARDIANCE 25 MG TABS tablet, Take 1 tablet (25 mg total) by mouth daily., Disp: 90 tablet, Rfl: 0   levothyroxine (SYNTHROID) 25 MCG tablet, Take 1 tablet (25 mcg total) by mouth every morning., Disp: 90 tablet, Rfl: 3   Magnesium Oxide -Mg Supplement 250 MG TABS, Take 1 tablet by mouth daily., Disp: , Rfl:    Vitamin D, Ergocalciferol, 50000 units CAPS, TAKE 1 CAPSULE 1 TIME A WEEK, Disp: 12 capsule, Rfl: 0   gabapentin (NEURONTIN) 300 MG capsule, Take 1 capsule (300 mg total) by mouth 3 (three) times daily., Disp: 270 capsule, Rfl: 0   lisinopril-hydrochlorothiazide (ZESTORETIC) 20-12.5 MG tablet, Take 1 tablet by mouth daily., Disp: 90 tablet, Rfl: 1   metFORMIN (GLUCOPHAGE) 1000 MG tablet, Take 1 tablet (1,000 mg total) by mouth 2 (two) times daily., Disp: 180 tablet, Rfl: 1   omeprazole (PRILOSEC) 20 MG capsule, Take 1 capsule (20 mg total) by mouth 2 (two) times daily before a meal for 14 days., Disp: 28 capsule, Rfl: 0   rosuvastatin (CRESTOR) 20 MG tablet, Take 1 tablet (20 mg total) by mouth daily., Disp: 90 tablet, Rfl: 1   Semaglutide, 1 MG/DOSE, (OZEMPIC, 1 MG/DOSE,) 4 MG/3ML SOPN, Inject 1 mg into the muscle once a week., Disp: 9 mL, Rfl: 1  No Known Allergies  I personally reviewed active problem list, medication list, allergies, health maintenance, notes from last encounter, lab  results with the patient/caregiver today.   ROS  See HPI for relevant ROS   Objective  Vitals:   06/24/23 0952  BP: 114/68  Pulse: (!) 57  SpO2: 97%  Weight: 191 lb (86.6 kg)  Height: 5\' 4"  (1.626 m)    Body mass index is 32.79 kg/m.  Physical Exam Vitals reviewed.  Constitutional:      General: He is awake.     Appearance: Normal appearance. He is well-developed and well-groomed.  HENT:     Head: Normocephalic and atraumatic.  Cardiovascular:     Rate and Rhythm: Normal rate and regular rhythm.     Pulses: Normal pulses.          Radial pulses are 2+ on the right side and 2+ on the left side.       Dorsalis pedis pulses are 2+ on the right side and 2+ on the left side.     Heart sounds: Normal heart sounds. No murmur heard.    No friction rub.  Pulmonary:     Effort: Pulmonary effort is normal.     Breath sounds: Normal breath sounds.  Musculoskeletal:     Cervical back: Normal range of motion.     Right lower leg: No edema.     Left lower leg: No edema.  Neurological:     Mental Status: He is alert.  Psychiatric:        Attention and Perception: Attention and perception normal.        Mood and Affect: Mood and affect normal.        Speech: Speech normal.        Behavior: Behavior normal. Behavior is cooperative.      No results found for this or any previous visit (from the past 2160 hour(s)).   PHQ2/9:    06/24/2023    9:57 AM 03/24/2023    9:21 AM 09/23/2022    9:11 AM 06/24/2022   10:49 AM 03/15/2022   10:19 AM  Depression screen PHQ 2/9  Decreased Interest 0 0 0 0 0  Down, Depressed, Hopeless 0 0 0 0 0  PHQ - 2 Score 0 0 0 0 0  Altered sleeping 0 0 0 0 0  Tired, decreased energy 0 0 0 0 0  Change in appetite 0 0 0 0 0  Feeling bad or failure about yourself  0 0 0 0 0  Trouble concentrating 0 0 0 0 0  Moving slowly or fidgety/restless 0 0 0 0 0  Suicidal thoughts 0 0 0 0 0  PHQ-9 Score 0 0 0 0 0  Difficult doing work/chores Not difficult at  all Not difficult at all Not difficult at all Not difficult at all Not difficult at all      Fall Risk:    06/24/2023    9:57 AM 03/24/2023    9:21 AM 09/23/2022    9:11 AM 06/24/2022   10:49 AM 03/15/2022   10:19 AM  Fall Risk   Falls in the past year? 0 0 0 0 0  Number falls in past yr: 0 0 0 0 0  Injury with Fall? 0 0 0 0 0  Risk for fall due to : No Fall Risks No Fall Risks No Fall Risks No Fall Risks No Fall Risks  Follow up Falls evaluation completed Falls evaluation completed Falls evaluation completed Falls evaluation completed Falls evaluation completed      Functional Status Survey:      Assessment & Plan  Problem List Items Addressed This Visit       Cardiovascular and Mediastinum   HTN (hypertension) (Chronic)    Chronic, historic condition  Appears well managed on current regimen comprised of Carvedilol 25 mg PO every day, Lisinopril- hydrochlorothiazide 20-12.5 mg PO every day  Continue with current regimen Refills provided today  Follow up in 3 months or sooner if concerns arise        Relevant Medications   rosuvastatin (CRESTOR) 20 MG tablet   lisinopril-hydrochlorothiazide (ZESTORETIC) 20-12.5 MG tablet   Other Relevant Orders   Comp Met (CMET)     Endocrine   Acquired hypothyroidism    Chronic, historic condition Most recent thyroid testing was within goal Continue current regimen of Levothyroxine 25 mcg PO every day  He denies concerns today  Recheck thyroid panel in about 3 months Follow up in 3 months or sooner if concerns arise        Type 2 diabetes mellitus with diabetic neuropathy, unspecified (HCC)    Chronic, historic condition Most recent A1c was 7.3 - recheck today. Results to dictate further management  Currently taking Ozempic  1 mg weekly injection, metformin 1000 mg PO BID, Jardiance 25 mg PO every day Checking glucose levels 3 times per day - reports fasting is usually <130 Continue current regimen for now, refills  provided Follow up in 3 months or sooner if concerns arise        Relevant Medications   Semaglutide, 1 MG/DOSE, (OZEMPIC, 1 MG/DOSE,) 4 MG/3ML SOPN   rosuvastatin (CRESTOR) 20 MG tablet   metFORMIN (GLUCOPHAGE) 1000 MG tablet   lisinopril-hydrochlorothiazide (ZESTORETIC) 20-12.5 MG tablet   gabapentin (NEURONTIN) 300 MG capsule   Other Relevant Orders   HgB A1c     Other   Mixed hyperlipidemia    Chronic, historic condition Appears stable and well managed with Rosuvastatin 20 mg PO QD  Continue current medications pending lab results Recheck lipid panel today - results to dictate further management Recommend he continues with walking and makes dietary changes to assist with diabetes and cholesterol Follow up in 3 months or sooner if concerns arise         Relevant Medications   rosuvastatin (CRESTOR) 20 MG tablet   lisinopril-hydrochlorothiazide (ZESTORETIC) 20-12.5 MG tablet   Other Visit Diagnoses     Abnormality of penis    -  Primary Patient reports new concern of penis but was not comfortable with genital exam today He reports color changes but denies pain with urination  Will place referral to Urology per his request as he has tried to schedule an apt with them but needed a referral first  Follow up as needed for persistent or progressing symptoms      Relevant Orders   Ambulatory referral to Urology   Anemia, unspecified type       Relevant Medications   rosuvastatin (CRESTOR) 20 MG tablet        Return in about 3 months (around 09/23/2023) for DM, HLD, HTN.   I, Tacia Hindley E Itha Kroeker, PA-C, have reviewed all documentation for this visit. The documentation on 06/24/23 for the exam, diagnosis, procedures, and orders are all accurate and complete.   Jacquelin Hawking, MHS, PA-C [facility]

## 2023-06-24 NOTE — Assessment & Plan Note (Signed)
Chronic, historic condition Appears stable and well managed with Rosuvastatin 20 mg PO QD  Continue current medications pending lab results Recheck lipid panel today - results to dictate further management Recommend he continues with walking and makes dietary changes to assist with diabetes and cholesterol Follow up in 3 months or sooner if concerns arise

## 2023-06-24 NOTE — Patient Instructions (Signed)
I recommend that you use a drying powder

## 2023-06-24 NOTE — Assessment & Plan Note (Signed)
Chronic, historic condition Most recent thyroid testing was within goal Continue current regimen of Levothyroxine 25 mcg PO every day  He denies concerns today  Recheck thyroid panel in about 3 months Follow up in 3 months or sooner if concerns arise

## 2023-06-24 NOTE — Assessment & Plan Note (Signed)
Chronic, historic condition  Appears well managed on current regimen comprised of Carvedilol 25 mg PO every day, Lisinopril- hydrochlorothiazide 20-12.5 mg PO every day  Continue with current regimen Refills provided today  Follow up in 3 months or sooner if concerns arise

## 2023-06-24 NOTE — Assessment & Plan Note (Signed)
Chronic, historic condition Most recent A1c was 7.3 - recheck today. Results to dictate further management  Currently taking Ozempic 1 mg weekly injection, metformin 1000 mg PO BID, Jardiance 25 mg PO every day Checking glucose levels 3 times per day - reports fasting is usually <130 Continue current regimen for now, refills provided Follow up in 3 months or sooner if concerns arise

## 2023-06-25 LAB — COMPREHENSIVE METABOLIC PANEL
ALT: 18 IU/L (ref 0–44)
AST: 18 IU/L (ref 0–40)
Albumin: 4.5 g/dL (ref 3.9–4.9)
Alkaline Phosphatase: 68 IU/L (ref 44–121)
BUN/Creatinine Ratio: 12 (ref 10–24)
BUN: 9 mg/dL (ref 8–27)
Bilirubin Total: 0.4 mg/dL (ref 0.0–1.2)
CO2: 21 mmol/L (ref 20–29)
Calcium: 9.7 mg/dL (ref 8.6–10.2)
Chloride: 104 mmol/L (ref 96–106)
Creatinine, Ser: 0.76 mg/dL (ref 0.76–1.27)
Globulin, Total: 2.7 g/dL (ref 1.5–4.5)
Glucose: 110 mg/dL — ABNORMAL HIGH (ref 70–99)
Potassium: 4.2 mmol/L (ref 3.5–5.2)
Sodium: 141 mmol/L (ref 134–144)
Total Protein: 7.2 g/dL (ref 6.0–8.5)
eGFR: 101 mL/min/{1.73_m2} (ref >59)

## 2023-06-25 LAB — HEMOGLOBIN A1C
Est. average glucose Bld gHb Est-mCnc: 154 mg/dL
Hgb A1c MFr Bld: 7 % — ABNORMAL HIGH (ref 4.8–5.6)

## 2023-06-28 NOTE — Progress Notes (Signed)
Your labs are back Your A1c was 7.0 which is good- please continue your medications  Your electrolytes, liver and kidney function were overall normal at this time

## 2023-07-19 ENCOUNTER — Other Ambulatory Visit: Payer: Self-pay | Admitting: Nurse Practitioner

## 2023-07-19 NOTE — Telephone Encounter (Signed)
Requested Prescriptions  Pending Prescriptions Disp Refills   levothyroxine (SYNTHROID) 25 MCG tablet [Pharmacy Med Name: LEVOTHYROXINE 25 MCG TABLET] 90 tablet 0    Sig: Take 1 tablet (25 mcg total) by mouth every morning.     Endocrinology:  Hypothyroid Agents Passed - 07/19/2023  9:35 AM      Passed - TSH in normal range and within 360 days    TSH  Date Value Ref Range Status  03/24/2023 3.610 0.450 - 4.500 uIU/mL Final         Passed - Valid encounter within last 12 months    Recent Outpatient Visits           3 weeks ago Abnormality of penis   [place] [name] Mecum, Erin E, PA-C   3 months ago Diabetes mellitus without complication (HCC)   [place] [name] Mecum, [name], PA-C   9 months ago Encounter for annual wellness exam in Medicare patient   [place] [facility] [name], NP   1 year ago Primary hypertension    [place] [name] Mecum, [name], PA-C   1 year ago Primary hypertension   [place] [name] Mecum, [name], PA-C       Future Appointments             In 2 months [name], NP [place] [facility]

## 2023-08-20 ENCOUNTER — Other Ambulatory Visit: Payer: Self-pay | Admitting: Nurse Practitioner

## 2023-08-20 DIAGNOSIS — E119 Type 2 diabetes mellitus without complications: Secondary | ICD-10-CM

## 2023-08-22 NOTE — Telephone Encounter (Signed)
Requested Prescriptions  Pending Prescriptions Disp Refills   JARDIANCE 25 MG TABS tablet [Pharmacy Med Name: JARDIANCE 25 MG TABLET] 90 tablet 0    Sig: Take 1 tablet (25 mg total) by mouth daily.     Endocrinology:  Diabetes - SGLT2 Inhibitors Passed - 08/20/2023  9:42 AM      Passed - Cr in normal range and within 360 days    Creatinine  Date Value Ref Range Status  01/10/2014 0.82 0.60 - 1.30 mg/dL Final   Creatinine, Ser  Date Value Ref Range Status  06/24/2023 0.76 0.76 - 1.27 mg/dL Final         Passed - HBA1C is between 0 and 7.9 and within 180 days    HB A1C (BAYER DCA - WAIVED)  Date Value Ref Range Status  12/30/2021 6.2 (H) 4.8 - 5.6 % Final    Comment:             Prediabetes: 5.7 - 6.4          Diabetes: >6.4          Glycemic control for adults with diabetes: <7.0    Hgb A1c MFr Bld  Date Value Ref Range Status  06/24/2023 7.0 (H) 4.8 - 5.6 % Final    Comment:             Prediabetes: 5.7 - 6.4          Diabetes: >6.4          Glycemic control for adults with diabetes: <7.0          Passed - eGFR in normal range and within 360 days    EGFR (African American)  Date Value Ref Range Status  01/10/2014 >60  Final   GFR calc Af Amer  Date Value Ref Range Status  12/09/2019 >60 >60 mL/min Final   EGFR (Non-African Amer.)  Date Value Ref Range Status  01/10/2014 >60  Final    Comment:    eGFR values <96mL/min/1.73 m2 may be an indication of chronic kidney disease (CKD). Calculated eGFR is useful in patients with stable renal function. The eGFR calculation will not be reliable in acutely ill patients when serum creatinine is changing rapidly. It is not useful in  patients on dialysis. The eGFR calculation may not be applicable to patients at the low and high extremes of body sizes, pregnant women, and vegetarians.    GFR, Estimated  Date Value Ref Range Status  04/04/2022 >60 >60 mL/min Final    Comment:    (NOTE) Calculated using the CKD-EPI  Creatinine Equation (2021)    eGFR  Date Value Ref Range Status  06/24/2023 101 >59 mL/min/1.73 Final         Passed - Valid encounter within last 6 months    Recent Outpatient Visits           1 month ago Abnormality of penis   [place] [name] Mecum, Erin E, PA-C   5 months ago Diabetes mellitus without complication (HCC)   [place] [name] Mecum, [name], PA-C   11 months ago Encounter for annual wellness exam in Medicare patient   [place] [name] [name], NP   1 year ago Primary hypertension   [place] [name] Mecum, [name], PA-C   1 year ago Primary hypertension    [name] Mecum, [name], PA-C       Future Appointments  In 1 week [name], Laurette Schimke, MD [name] [place]   In 1 month [name], NP [facility]

## 2023-08-30 ENCOUNTER — Encounter: Payer: Self-pay | Admitting: Urology

## 2023-08-30 ENCOUNTER — Other Ambulatory Visit: Payer: Self-pay | Admitting: *Deleted

## 2023-08-30 ENCOUNTER — Other Ambulatory Visit
Admission: RE | Admit: 2023-08-30 | Discharge: 2023-08-30 | Disposition: A | Discharge status: Home / Self Care | Payer: Medicare HMO | Attending: Urology | Admitting: Urology

## 2023-08-30 ENCOUNTER — Ambulatory Visit: Payer: Medicare HMO | Admitting: Urology

## 2023-08-30 VITALS — BP 117/66 | HR 80 | Ht 64.0 in | Wt 188.4 lb

## 2023-08-30 DIAGNOSIS — R300 Dysuria: Secondary | ICD-10-CM | POA: Insufficient documentation

## 2023-08-30 DIAGNOSIS — R3912 Poor urinary stream: Secondary | ICD-10-CM | POA: Diagnosis not present

## 2023-08-30 DIAGNOSIS — N489 Disorder of penis, unspecified: Secondary | ICD-10-CM

## 2023-08-30 DIAGNOSIS — N401 Enlarged prostate with lower urinary tract symptoms: Secondary | ICD-10-CM

## 2023-08-30 LAB — URINALYSIS, COMPLETE (UACMP) WITH MICROSCOPIC
Bilirubin Urine: NEGATIVE
Glucose, UA: 500 mg/dL — AB
Ketones, ur: NEGATIVE mg/dL
Leukocytes,Ua: NEGATIVE
Nitrite: NEGATIVE
Protein, ur: NEGATIVE mg/dL
Specific Gravity, Urine: 1.01 (ref 1.005–1.030)
pH: 6 (ref 5.0–8.0)

## 2023-08-30 MED ORDER — TAMSULOSIN HCL 0.4 MG PO CAPS: 0.4000 mg | ORAL_CAPSULE | Freq: Every day | ORAL | 11 refills | Status: DC

## 2023-08-30 NOTE — Progress Notes (Signed)
08/30/23 11:04 AM   Shane Preston [birth year] [id number]  CC: Penile lesion, urinary symptoms, PSA screening  HPI: 62 year old male referred for the above issues.  He has diabetes with a recent hemoglobin A1c of 7.3.  He has had a penile lesion for about 1 year that he had questions about, he was uncomfortable with his PCP examining him and was referred to urology.  He also has about a year of urinary symptoms with weak stream and sensation of incomplete emptying.  He took an over-the-counter supplement that helped initially but he only took that for about a month.  Urinalysis today is benign aside from glucosuria, PVR is mildly elevated at .  Prior CT scan from June 2023 shows no urologic abnormalities and prostate measures 43 g.  PSA was normal at 0.5 from October 2022.  PMH: 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   Lumbar stenosis    Neuropathy    Peripheral vascular disease (HCC)    TIA (transient ischemic attack)    Tinea pedis     Surgical History: Past Surgical History:  Procedure Laterality Date   BIOPSY N/A 04/20/2022   Procedure: BIOPSY;  Surgeon: Toney Reil, MD;  Location: [facility];  Service: Endoscopy;  Laterality: N/A;   CARDIAC CATHETERIZATION     [facility]    COLONOSCOPY WITH PROPOFOL N/A 10/30/2021   Procedure: COLONOSCOPY WITH PROPOFOL;  Surgeon: Toney Reil, MD;  Location: [facility];  Service: Gastroenterology;  Laterality: N/A;   COLONOSCOPY WITH PROPOFOL N/A 04/20/2022   Procedure: COLONOSCOPY WITH PROPOFOL;  Surgeon: Toney Reil, MD;  Location: [facility];  Service: Endoscopy;  Laterality: N/A;   ESOPHAGOGASTRODUODENOSCOPY (EGD) WITH PROPOFOL N/A 04/20/2022   Procedure: ESOPHAGOGASTRODUODENOSCOPY (EGD)  WITH PROPOFOL;  Surgeon: Toney Reil, MD;  Location: [facility];  Service: Endoscopy;  Laterality: N/A;   EYE SURGERY     HERNIA REPAIR  10/11/2005   lumbar decompression surgery  10/11/2008     Family History: Family History  Family history unknown: Yes    Social History:  reports that he has never smoked. He has never used smokeless tobacco. He reports that he does not currently use alcohol. He reports that he does not currently use drugs.  Physical Exam: BP 117/66 (BP Location: Left Arm, Patient Position: Sitting, Cuff Size: Large)   Pulse 80   Ht 5\' 4"  (1.626 m)   Wt 188 lb 6.4 oz (85.5 kg)   BMI 32.34 kg/m    Constitutional:  Alert and oriented, No acute distress. Cardiovascular: No clubbing, cyanosis, or edema. Respiratory: Normal respiratory effort, no increased work of breathing. GI: Abdomen is soft, nontender, nondistended, no abdominal masses GU: Uncircumcised phallus with patent meatus, small brown flat lesion on the right aspect of the shaft under the foreskin, nontender, measures 5 mm  Laboratory Data: Reviewed, see HPI  Pertinent Imaging: I have personally viewed and interpreted the CT scan from June 2023 with no urologic abnormalities, bladder decompressed, no hydronephrosis or nephrolithiasis, prostate measures 43g.  Assessment & Plan:   62 year old male with diabetes here with concerns about urination, as well as a small lesion on the penis.  Reassurance provided regarding the small lesion on the penis that apparently has been stable for over  a year, and appears benign.  In terms of his urinary symptoms, I recommended a trial of Flomax, and risks and benefits were discussed.  We also discussed the relationship between diabetes and glucosuria and urinary frequency.  Trial of Flomax, risk and benefits discussed RTC 8 weeks PVR and symptom check  Legrand Rams, MD 08/30/2023  [facility] [address], [address] [place],  [place] [address] [phone]

## 2023-09-05 ENCOUNTER — Other Ambulatory Visit: Payer: Self-pay

## 2023-09-05 ENCOUNTER — Inpatient Hospital Stay: Payer: Medicare HMO | Attending: Oncology

## 2023-09-05 DIAGNOSIS — D500 Iron deficiency anemia secondary to blood loss (chronic): Secondary | ICD-10-CM | POA: Diagnosis not present

## 2023-09-05 DIAGNOSIS — K625 Hemorrhage of anus and rectum: Secondary | ICD-10-CM | POA: Diagnosis not present

## 2023-09-05 DIAGNOSIS — D509 Iron deficiency anemia, unspecified: Secondary | ICD-10-CM

## 2023-09-05 LAB — IRON AND TIBC
Iron: 96 ug/dL (ref 45–182)
Saturation Ratios: 22 % (ref 17.9–39.5)
TIBC: 430 ug/dL (ref 250–450)
UIBC: 334 ug/dL

## 2023-09-05 LAB — CBC WITH DIFFERENTIAL (CANCER CENTER ONLY)
Abs Immature Granulocytes: 0.01 10*3/uL (ref 0.00–0.07)
Basophils Absolute: 0 10*3/uL (ref 0.0–0.1)
Basophils Relative: 0 %
Eosinophils Absolute: 0.1 10*3/uL (ref 0.0–0.5)
Eosinophils Relative: 1 %
HCT: 43.2 % (ref 39.0–52.0)
Hemoglobin: 14.4 g/dL (ref 13.0–17.0)
Immature Granulocytes: 0 %
Lymphocytes Relative: 29 %
Lymphs Abs: 1.8 10*3/uL (ref 0.7–4.0)
MCH: 29.9 pg (ref 26.0–34.0)
MCHC: 33.3 g/dL (ref 30.0–36.0)
MCV: 89.8 fL (ref 80.0–100.0)
Monocytes Absolute: 0.4 10*3/uL (ref 0.1–1.0)
Monocytes Relative: 6 %
Neutro Abs: 3.9 10*3/uL (ref 1.7–7.7)
Neutrophils Relative %: 64 %
Platelet Count: 276 10*3/uL (ref 150–400)
RBC: 4.81 MIL/uL (ref 4.22–5.81)
RDW: 12.5 % (ref 11.5–15.5)
WBC Count: 6.3 10*3/uL (ref 4.0–10.5)
nRBC: 0 % (ref 0.0–0.2)

## 2023-09-05 LAB — FERRITIN: Ferritin: 36 ng/mL (ref 24–336)

## 2023-09-07 ENCOUNTER — Encounter: Payer: Self-pay | Admitting: Oncology

## 2023-09-07 ENCOUNTER — Inpatient Hospital Stay: Payer: Medicare HMO | Admitting: Oncology

## 2023-09-07 VITALS — BP 102/54 | HR 66 | Temp 98.5°F | Wt 192.0 lb

## 2023-09-07 DIAGNOSIS — D509 Iron deficiency anemia, unspecified: Secondary | ICD-10-CM

## 2023-09-07 DIAGNOSIS — K625 Hemorrhage of anus and rectum: Secondary | ICD-10-CM | POA: Diagnosis not present

## 2023-09-07 DIAGNOSIS — D500 Iron deficiency anemia secondary to blood loss (chronic): Secondary | ICD-10-CM | POA: Diagnosis not present

## 2023-09-07 NOTE — Progress Notes (Signed)
Hematology/Oncology Progress note [phone] [phone]         Patient Care Team: [name], NP as PCP - General (Nurse Practitioner) Leanna Sato, MD (Family Medicine) Rickard Patience, MD as Consulting Physician (Hematology and Oncology) Toney Reil, MD as Consulting Physician (Gastroenterology) Iran Ouch, MD as Consulting Physician (Cardiology)  ASSESSMENT & PLAN:   Iron deficiency anemia Iron deficiency anemia secondary to chronic GI blood loss. Previously tolerated IV Venofer treatments Labs reviewed and discussed patient Hemoglobin has normalized.  Iron panel has been stable.  Lab Results  Component Value Date   IRON 96 09/05/2023   TIBC 430 09/05/2023   IRONPCTSAT 22 09/05/2023   FERRITIN 36 09/05/2023     No need for IV venfoer or oral iron supplementation. Given that he start to have intermittent rectal bleeding again, pending GI evaluation, I recommend him to take otc iron supplementation every other day. Add colace if he gets constipated.   He declines spanish interpretor.  Follow up in 3 months  All questions were answered.  Rickard Patience, MD, PhD [facility] Hematology Oncology 09/07/2023   CHIEF COMPLAINTS/REASON FOR VISIT:  Follow-up for iron deficiency anemia HISTORY OF PRESENTING ILLNESS:   Shane Preston is a  62 y.o.  male with PMH listed below was seen in consultation at the request of  [name], NP  for evaluation of anemia 10/13/2021, hemoglobin 10.4, MCV 80. 10/14/2021, iron panel showed iron saturation 7, ferritin 8, TIBC 417. 10/21/2021, patient had a CBC done which showed a hemoglobin of 9.8, MCV 81. Review previous lab results.  Anemia is new onset, since October 2022. Patient reports painless rectal bleeding.  Has been referred to establish care with gastroenterology Dr. Allegra Lai and has a colonoscopy scheduled.  10/30/2021, patient had colonoscopy done by Dr. Allegra Lai colonoscopy showed poor  preparation of the colon.  Solitary ulcer in the distal rectum.  Patient was recommended to follow-up in 6 months with 2-day prep. 04/20/22 repeat colonoscopy  INTERVAL HISTORY Shane Preston Shane Preston is a 62 y.o. male who has above history reviewed by me today presents for follow up visit for Iron deficiency anemia He has noticed intermittent rectal bleeding again.  He denies any constipation.  He has called GI and will see Dr> Vanga in early Jan.   Review of Systems  Constitutional:  Negative for appetite change, chills, fatigue, fever and unexpected weight change.  HENT:   Negative for hearing loss and voice change.   Eyes:  Negative for eye problems and icterus.  Respiratory:  Negative for chest tightness, cough and shortness of breath.   Cardiovascular:  Negative for chest pain and leg swelling.  Gastrointestinal:  Positive for blood in stool. Negative for abdominal distention, abdominal pain and rectal pain.  Endocrine: Negative for hot flashes.  Genitourinary:  Negative for difficulty urinating, dysuria and frequency.   Musculoskeletal:  Negative for arthralgias.  Skin:  Negative for itching and rash.  Neurological:  Negative for light-headedness and numbness.  Hematological:  Negative for adenopathy. Does not bruise/bleed easily.  Psychiatric/Behavioral:  Negative for confusion.     MEDICAL HISTORY:  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   Lumbar stenosis    Neuropathy    Peripheral vascular disease (HCC)  TIA (transient ischemic attack)    Tinea pedis     SURGICAL HISTORY: Past Surgical History:  Procedure Laterality Date   BIOPSY N/A 04/20/2022   Procedure: BIOPSY;  Surgeon: Toney Reil, MD;  [facility];  Service: Endoscopy;  Laterality: N/A;    CARDIAC CATHETERIZATION     [facility]    COLONOSCOPY WITH PROPOFOL N/A 10/30/2021   Procedure: COLONOSCOPY WITH PROPOFOL;  Surgeon: Toney Reil, MD;  Location: [facility];  Service: Gastroenterology;  Laterality: N/A;   COLONOSCOPY WITH PROPOFOL N/A 04/20/2022   Procedure: COLONOSCOPY WITH PROPOFOL;  Surgeon: Toney Reil, MD;  Location: [facility];  Service: Endoscopy;  Laterality: N/A;   ESOPHAGOGASTRODUODENOSCOPY (EGD) WITH PROPOFOL N/A 04/20/2022   Procedure: ESOPHAGOGASTRODUODENOSCOPY (EGD) WITH PROPOFOL;  Surgeon: Toney Reil, MD;  Location: [facility];  Service: Endoscopy;  Laterality: N/A;   EYE SURGERY     HERNIA REPAIR  10/11/2005   lumbar decompression surgery  10/11/2008    SOCIAL HISTORY: Social History   Socioeconomic History   Marital status: Married    Spouse name: Not on file   Number of children: Not on file   Years of education: Not on file   Highest education level: Not on file  Occupational History   Not on file  Tobacco Use   Smoking status: Never   Smokeless tobacco: Never  Vaping Use   Vaping status: Never Used  Substance and Sexual Activity   Alcohol use: Not Currently   Drug use: Not Currently   Sexual activity: Not Currently  Other Topics Concern   Not on file  Social History Narrative   ** Merged History Encounter **       Social Determinants of Health   Financial Resource Strain: Low Risk  (09/01/2021)   Overall Financial Resource Strain (CARDIA)    Difficulty of Paying Living Expenses: Not hard at all  Food Insecurity: No Food Insecurity (09/01/2021)   Hunger Vital Sign    Worried About Running Out of Food in the Last Year: Never true    Ran Out of Food in the Last Year: Never true  Transportation Needs: No Transportation Needs (09/01/2021)   PRAPARE - Administrator, Civil Service (Medical): No    Lack of Transportation (Non-Medical): No  Physical Activity: Inactive (09/01/2021)    Exercise Vital Sign    Days of Exercise per Week: 0 days    Minutes of Exercise per Session: 0 min  Stress: No Stress Concern Present (09/01/2021)   [employer] of Occupational Health - Occupational Stress Questionnaire    Feeling of Stress : Not at all  Social Connections: Moderately Isolated (09/01/2021)   Social Connection and Isolation Panel [NHANES]    Frequency of Communication with Friends and Family: More than three times a week    Frequency of Social Gatherings with Friends and Family: More than three times a week    Attends Religious Services: Never    Database administrator or Organizations: No    Attends Banker Meetings: Never    Marital Status: Married  Catering manager Violence: Not At Risk (09/01/2021)   Humiliation, Afraid, Rape, and Kick questionnaire    Fear of Current or Ex-Partner: No    Emotionally Abused: No    Physically Abused: No    Sexually Abused: No    FAMILY HISTORY: Family History  Family history unknown: Yes    ALLERGIES:  has No Known Allergies.  MEDICATIONS:  Current Outpatient Medications  Medication Sig Dispense Refill   Accu-Chek Softclix Lancets lancets USE 1 LANCET THREE TIMES A DAY 300 each 2   aspirin EC 81 MG tablet Take 81 mg by mouth daily. Swallow whole.     carvedilol (COREG) 25 MG tablet Take 1 tablet (25 mg total) by mouth 2 (two) times daily. 180 tablet 1   cyanocobalamin (VITAMIN B12) 1000 MCG tablet Take 1,000 mcg by mouth daily.     gabapentin (NEURONTIN) 300 MG capsule Take 1 capsule (300 mg total) by mouth 3 (three) times daily. 270 capsule 0   glucose blood (ACCU-CHEK AVIVA PLUS) test strip USE 1 THREE TIMES A DAY 300 strip 1   JARDIANCE 25 MG TABS tablet Take 1 tablet (25 mg total) by mouth daily. 90 tablet 0   levothyroxine (SYNTHROID) 25 MCG tablet Take 1 tablet (25 mcg total) by mouth every morning. 90 tablet 0   lisinopril-hydrochlorothiazide (ZESTORETIC) 20-12.5 MG tablet Take 1 tablet by mouth  daily. 90 tablet 1   Magnesium Oxide -Mg Supplement 250 MG TABS Take 1 tablet by mouth daily.     metFORMIN (GLUCOPHAGE) 1000 MG tablet Take 1 tablet (1,000 mg total) by mouth 2 (two) times daily. 180 tablet 1   omeprazole (PRILOSEC) 20 MG capsule Take 1 capsule (20 mg total) by mouth 2 (two) times daily before a meal for 14 days. 28 capsule 0   rosuvastatin (CRESTOR) 20 MG tablet Take 1 tablet (20 mg total) by mouth daily. 90 tablet 1   Semaglutide, 1 MG/DOSE, (OZEMPIC, 1 MG/DOSE,) 4 MG/3ML SOPN Inject 1 mg into the muscle once a week. 9 mL 1   tamsulosin (FLOMAX) 0.4 MG CAPS capsule Take 1 capsule (0.4 mg total) by mouth daily. 30 capsule 11   Vitamin D, Ergocalciferol, 50000 units CAPS TAKE 1 CAPSULE 1 TIME A WEEK 12 capsule 0   No current facility-administered medications for this visit.     PHYSICAL EXAMINATION: ECOG PERFORMANCE STATUS: 1 - Symptomatic but completely ambulatory Vitals:   09/07/23 0841  BP: (!) 102/54  Pulse: 66  Temp: 98.5 F (36.9 C)  SpO2: 100%   Filed Weights   09/07/23 0841  Weight: 192 lb (87.1 kg)    Physical Exam Constitutional:      General: He is not in acute distress. HENT:     Head: Normocephalic and atraumatic.  Eyes:     General: No scleral icterus. Cardiovascular:     Rate and Rhythm: Normal rate and regular rhythm.     Heart sounds: Normal heart sounds.  Pulmonary:     Effort: Pulmonary effort is normal. No respiratory distress.     Breath sounds: No wheezing.  Abdominal:     General: Bowel sounds are normal. There is no distension.     Palpations: Abdomen is soft.  Musculoskeletal:        General: No deformity. Normal range of motion.     Cervical back: Normal range of motion and neck supple.  Skin:    General: Skin is warm and dry.     Findings: No erythema or rash.  Neurological:     Mental Status: He is alert and oriented to person, place, and time. Mental status is at baseline.     Cranial Nerves: No cranial nerve deficit.      Coordination: Coordination normal.  Psychiatric:        Mood and Affect: Mood normal.     LABORATORY DATA:  I have  reviewed the data as listed    Latest Ref Rng & Units 09/05/2023   10:01 AM 03/24/2023    9:49 AM 02/01/2023    9:23 AM  CBC  WBC 4.0 - 10.5 K/uL 6.3  5.8  6.0   Hemoglobin 13.0 - 17.0 g/dL 14.7  82.9  56.2   Hematocrit 39.0 - 52.0 % 43.2  44.2  42.4   Platelets 150 - 400 K/uL 276  275  291       Latest Ref Rng & Units 06/24/2023   10:33 AM 03/24/2023    9:49 AM 09/23/2022    9:27 AM  CMP  Glucose 70 - 99 mg/dL 130  865  784   BUN 8 - 27 mg/dL 9  13  10    Creatinine 0.76 - 1.27 mg/dL 6.96  2.95  2.84   Sodium 134 - 144 mmol/L 141  140  138   Potassium 3.5 - 5.2 mmol/L 4.2  4.3  4.4   Chloride 96 - 106 mmol/L 104  101  103   CO2 20 - 29 mmol/L 21  23  22    Calcium 8.6 - 10.2 mg/dL 9.7  9.9  9.2   Total Protein 6.0 - 8.5 g/dL 7.2  7.4  7.1   Total Bilirubin 0.0 - 1.2 mg/dL 0.4  0.5  0.3   Alkaline Phos 44 - 121 IU/L 68  68  70   AST 0 - 40 IU/L 18  20  17    ALT 0 - 44 IU/L 18  19  20     Lab Results  Component Value Date   IRON 96 09/05/2023   TIBC 430 09/05/2023   FERRITIN 36 09/05/2023       RADIOGRAPHIC STUDIES: I have personally reviewed the radiological images as listed and agreed with the findings in the report. No results found.

## 2023-09-07 NOTE — Assessment & Plan Note (Addendum)
Iron deficiency anemia secondary to chronic GI blood loss. Previously tolerated IV Venofer treatments Labs reviewed and discussed patient Hemoglobin has normalized.  Iron panel has been stable.  Lab Results  Component Value Date   IRON 96 09/05/2023   TIBC 430 09/05/2023   IRONPCTSAT 22 09/05/2023   FERRITIN 36 09/05/2023     No need for IV venfoer or oral iron supplementation. Given that he start to have intermittent rectal bleeding again, pending GI evaluation, I recommend him to take otc iron supplementation every other day. Add colace if he gets constipated.

## 2023-09-12 ENCOUNTER — Telehealth: Payer: Self-pay | Admitting: Gastroenterology

## 2023-09-12 DIAGNOSIS — K625 Hemorrhage of anus and rectum: Secondary | ICD-10-CM

## 2023-09-12 MED ORDER — HYDROCORTISONE (PERIANAL) 2.5 % EX CREA
1.0000 | TOPICAL_CREAM | Freq: Two times a day (BID) | CUTANEOUS | 0 refills | Status: DC
Start: 2023-09-12 — End: 2024-09-03

## 2023-09-12 NOTE — Telephone Encounter (Signed)
Patient verbalized understanding of instructions  

## 2023-09-12 NOTE — Telephone Encounter (Signed)
PT requesting call back having the same issue from last visit

## 2023-09-12 NOTE — Telephone Encounter (Signed)
Lets check his CBC and serum ferritin levels Recommend Anusol cream pea-sized per rectum twice daily for 1 week  RV

## 2023-09-12 NOTE — Telephone Encounter (Signed)
Patient has appointment on 11/07/2023. Patient states for the last week he has had Rectal bleeding with every bowel movement that fills up the toilet, Rectal itching. He states he really needs to be seen before January. Informed him we did not have anything but I would see what Dr. Allegra Lai recommended

## 2023-09-12 NOTE — Addendum Note (Signed)
Addended by: Radene Knee L on: 09/12/2023 04:59 PM   Modules accepted: Orders

## 2023-09-20 DIAGNOSIS — K625 Hemorrhage of anus and rectum: Secondary | ICD-10-CM | POA: Diagnosis not present

## 2023-09-21 ENCOUNTER — Telehealth: Payer: Self-pay

## 2023-09-21 LAB — CBC WITH DIFFERENTIAL/PLATELET
Basophils Absolute: 0 10*3/uL (ref 0.0–0.2)
Basos: 1 %
EOS (ABSOLUTE): 0.1 10*3/uL (ref 0.0–0.4)
Eos: 2 %
Hematocrit: 45.1 % (ref 37.5–51.0)
Hemoglobin: 14 g/dL (ref 13.0–17.7)
Immature Grans (Abs): 0 10*3/uL (ref 0.0–0.1)
Immature Granulocytes: 0 %
Lymphocytes Absolute: 1.8 10*3/uL (ref 0.7–3.1)
Lymphs: 29 %
MCH: 30 pg (ref 26.6–33.0)
MCHC: 31 g/dL — ABNORMAL LOW (ref 31.5–35.7)
MCV: 97 fL (ref 79–97)
Monocytes Absolute: 0.4 10*3/uL (ref 0.1–0.9)
Monocytes: 6 %
Neutrophils Absolute: 4 10*3/uL (ref 1.4–7.0)
Neutrophils: 62 %
Platelets: 302 10*3/uL (ref 150–450)
RBC: 4.67 x10E6/uL (ref 4.14–5.80)
RDW: 13.4 % (ref 11.6–15.4)
WBC: 6.4 10*3/uL (ref 3.4–10.8)

## 2023-09-21 LAB — FERRITIN: Ferritin: 44 ng/mL (ref 30–400)

## 2023-09-21 NOTE — Telephone Encounter (Signed)
-----   Message from [facility] sent at 09/21/2023  1:43 PM EST ----- His hemoglobin is normal.  I can see him next available for hemorrhoid banding  RV

## 2023-09-21 NOTE — Telephone Encounter (Signed)
Patient verbalized understanding of results  

## 2023-09-23 ENCOUNTER — Encounter: Payer: Self-pay | Admitting: Nurse Practitioner

## 2023-09-23 ENCOUNTER — Other Ambulatory Visit: Payer: Self-pay | Admitting: Nurse Practitioner

## 2023-09-23 ENCOUNTER — Ambulatory Visit (INDEPENDENT_AMBULATORY_CARE_PROVIDER_SITE_OTHER): Payer: Medicare HMO | Admitting: Nurse Practitioner

## 2023-09-23 VITALS — BP 96/60 | HR 58 | Temp 98.7°F | Ht 61.6 in | Wt 190.6 lb

## 2023-09-23 DIAGNOSIS — E039 Hypothyroidism, unspecified: Secondary | ICD-10-CM

## 2023-09-23 DIAGNOSIS — E559 Vitamin D deficiency, unspecified: Secondary | ICD-10-CM

## 2023-09-23 DIAGNOSIS — E782 Mixed hyperlipidemia: Secondary | ICD-10-CM | POA: Diagnosis not present

## 2023-09-23 DIAGNOSIS — E1140 Type 2 diabetes mellitus with diabetic neuropathy, unspecified: Secondary | ICD-10-CM | POA: Diagnosis not present

## 2023-09-23 DIAGNOSIS — Z7985 Long-term (current) use of injectable non-insulin antidiabetic drugs: Secondary | ICD-10-CM

## 2023-09-23 DIAGNOSIS — D649 Anemia, unspecified: Secondary | ICD-10-CM

## 2023-09-23 DIAGNOSIS — Z23 Encounter for immunization: Secondary | ICD-10-CM | POA: Diagnosis not present

## 2023-09-23 DIAGNOSIS — E1142 Type 2 diabetes mellitus with diabetic polyneuropathy: Secondary | ICD-10-CM

## 2023-09-23 DIAGNOSIS — I10 Essential (primary) hypertension: Secondary | ICD-10-CM

## 2023-09-23 DIAGNOSIS — E6609 Other obesity due to excess calories: Secondary | ICD-10-CM | POA: Diagnosis not present

## 2023-09-23 DIAGNOSIS — D509 Iron deficiency anemia, unspecified: Secondary | ICD-10-CM

## 2023-09-23 DIAGNOSIS — Z8639 Personal history of other endocrine, nutritional and metabolic disease: Secondary | ICD-10-CM

## 2023-09-23 LAB — MICROALBUMIN, URINE WAIVED
Creatinine, Urine Waived: 10 mg/dL (ref 10–300)
Microalb, Ur Waived: 10 mg/L (ref 0–19)

## 2023-09-23 MED ORDER — GABAPENTIN 300 MG PO CAPS: 300.0000 mg | ORAL_CAPSULE | Freq: Three times a day (TID) | ORAL | 1 refills | Status: DC

## 2023-09-23 MED ORDER — METFORMIN HCL 1000 MG PO TABS: 1000.0000 mg | ORAL_TABLET | Freq: Two times a day (BID) | ORAL | 1 refills | Status: DC

## 2023-09-23 MED ORDER — CARVEDILOL 25 MG PO TABS: 25.0000 mg | ORAL_TABLET | Freq: Two times a day (BID) | ORAL | 1 refills | Status: DC

## 2023-09-23 MED ORDER — OZEMPIC (1 MG/DOSE) 4 MG/3ML ~~LOC~~ SOPN: 1.0000 mg | PEN_INJECTOR | SUBCUTANEOUS | 1 refills | Status: DC

## 2023-09-23 MED ORDER — ROSUVASTATIN CALCIUM 20 MG PO TABS: 20.0000 mg | ORAL_TABLET | Freq: Every day | ORAL | 1 refills | Status: DC

## 2023-09-23 MED ORDER — LISINOPRIL-HYDROCHLOROTHIAZIDE 20-12.5 MG PO TABS: 1.0000 | ORAL_TABLET | Freq: Every day | ORAL | 1 refills | Status: DC

## 2023-09-23 NOTE — Assessment & Plan Note (Signed)
Chronic.  Controlled.  Continue with current medication regimen.  Followed by Dr Cathie Hoops.  Endorses fatigue during the day.  Labs ordered today.  Return to clinic in 6 months for reevaluation.  Call sooner if concerns arise.

## 2023-09-23 NOTE — Assessment & Plan Note (Signed)
Chronic, stable. Continue current levothyroxine dose. Labs ordered today. Will make recommendations based on lab results.  Follow up in 6 months.  Call sooner if concerns arise.

## 2023-09-23 NOTE — Progress Notes (Signed)
BP 96/60   Pulse (!) 58   Temp 98.7 F (37.1 C) (Oral)   Ht 5' 1.6" (1.565 m)   Wt 190 lb 9.6 oz (86.5 kg)   SpO2 98%   BMI 35.32 kg/m    Subjective:    Patient ID: Shane Preston, male    DOB: 1961-05-19, 63 y.o.   MRN: [id number]  HPI: Shane Preston is a 62 y.o. male  Chief Complaint  Patient presents with   Diabetes   Hyperlipidemia   Hypertension   HYPERTENSION / HYPERLIPIDEMIA Satisfied with current treatment? yes Duration of hypertension: years BP monitoring frequency: daily BP range: 135/80 BP medication side effects: no Past BP meds: carvdeilol and lisinopril-HCTZ Duration of hyperlipidemia: years Cholesterol medication side effects: no Cholesterol supplements: none Past cholesterol medications: rosuvastatin (crestor) Medication compliance: excellent compliance Aspirin: no Recent stressors: no Recurrent headaches: no Visual changes: no Palpitations: no Dyspnea: no Chest pain: no Lower extremity edema: no Dizzy/lightheaded: no  DIABETES Hypoglycemic episodes:no Polydipsia/polyuria: no Visual disturbance: no Chest pain: no Paresthesias: no Glucose Monitoring: yes  Accucheck frequency: daily  Fasting glucose:127-130  Post prandial:  Evening:  Before meals: Taking Insulin?: no  Long acting insulin:  Short acting insulin: Blood Pressure Monitoring: daily Retinal Examination: Up to Date Foot Exam: Up to Date Diabetic Education: Not Completed Pneumovax: Up to Date Influenza: Up to Date Aspirin: no  ANEMIA Anemia status: controlled Etiology of anemia: Duration of anemia treatment:  Compliance with treatment: excellent compliance Iron supplementation side effects: no Severity of anemia: severe Fatigue: yes Decreased exercise tolerance: no  Dyspnea on exertion: no Palpitations: no Bleeding: no Pica: no  HYPOTHYROIDISM Thyroid control status:controlled Satisfied with current treatment? no Medication side effects:  no Medication compliance: excellent compliance Etiology of hypothyroidism:  Recent dose adjustment:no Fatigue: no Cold intolerance: no Heat intolerance: no Weight gain: no Weight loss: no Constipation: no Diarrhea/loose stools: no Palpitations: no Lower extremity edema: no Anxiety/depressed mood: no  Patient states he falls asleep and is very sleepy during the day.  States he sleeps well at night.  He isn't sure if he is snoring at night.    Relevant past medical, surgical, family and social history reviewed and updated as indicated. Interim medical history since our last visit reviewed. Allergies and medications reviewed and updated.  Review of Systems  Constitutional:  Positive for fatigue. Negative for unexpected weight change.  Eyes:  Negative for visual disturbance.  Respiratory:  Negative for chest tightness and shortness of breath.   Cardiovascular:  Negative for chest pain, palpitations and leg swelling.  Gastrointestinal:  Negative for constipation and diarrhea.  Endocrine: Negative for cold intolerance, heat intolerance, polydipsia and polyuria.  Neurological:  Negative for dizziness, light-headedness, numbness and headaches.  Psychiatric/Behavioral:  Negative for dysphoric mood. The patient is not nervous/anxious.     Per HPI unless specifically indicated above     Objective:    BP 96/60   Pulse (!) 58   Temp 98.7 F (37.1 C) (Oral)   Ht 5' 1.6" (1.565 m)   Wt 190 lb 9.6 oz (86.5 kg)   SpO2 98%   BMI 35.32 kg/m   Wt Readings from Last 3 Encounters:  09/23/23 190 lb 9.6 oz (86.5 kg)  09/07/23 192 lb (87.1 kg)  08/30/23 188 lb 6.4 oz (85.5 kg)    Physical Exam Vitals and nursing note reviewed.  Constitutional:      General: He is not in acute distress.    Appearance: Normal  appearance. He is obese. He is not ill-appearing, toxic-appearing or diaphoretic.  HENT:     Head: Normocephalic.     Right Ear: External ear normal.     Left Ear: External ear  normal.     Nose: Nose normal. No congestion or rhinorrhea.     Mouth/Throat:     Mouth: Mucous membranes are moist.  Eyes:     General:        Right eye: No discharge.        Left eye: No discharge.     Extraocular Movements: Extraocular movements intact.     Conjunctiva/sclera: Conjunctivae normal.     Pupils: Pupils are equal, round, and reactive to light.  Cardiovascular:     Rate and Rhythm: Normal rate and regular rhythm.     Heart sounds: No murmur heard. Pulmonary:     Effort: Pulmonary effort is normal. No respiratory distress.     Breath sounds: Normal breath sounds. No wheezing, rhonchi or rales.  Abdominal:     General: Abdomen is flat. Bowel sounds are normal.  Musculoskeletal:     Cervical back: Normal range of motion and neck supple.  Skin:    General: Skin is warm and dry.     Capillary Refill: Capillary refill takes less than 2 seconds.  Neurological:     General: No focal deficit present.     Mental Status: He is alert and oriented to person, place, and time.  Psychiatric:        Mood and Affect: Mood normal.        Behavior: Behavior normal.        Thought Content: Thought content normal.        Judgment: Judgment normal.     Results for orders placed or performed in visit on 09/12/23  CBC with Differential   Collection Time: 09/20/23  9:54 AM  Result Value Ref Range   WBC 6.4 3.4 - 10.8 x10E3/uL   RBC 4.67 4.14 - 5.80 x10E6/uL   Hemoglobin 14.0 13.0 - 17.7 g/dL   Hematocrit 40.9 81.1 - 51.0 %   MCV 97 79 - 97 fL   MCH 30.0 26.6 - 33.0 pg   MCHC 31.0 (L) 31.5 - 35.7 g/dL   RDW 91.4 78.2 - 95.6 %   Platelets 302 150 - 450 x10E3/uL   Neutrophils 62 Not Estab. %   Lymphs 29 Not Estab. %   Monocytes 6 Not Estab. %   Eos 2 Not Estab. %   Basos 1 Not Estab. %   Neutrophils Absolute 4.0 1.4 - 7.0 x10E3/uL   Lymphocytes Absolute 1.8 0.7 - 3.1 x10E3/uL   Monocytes Absolute 0.4 0.1 - 0.9 x10E3/uL   EOS (ABSOLUTE) 0.1 0.0 - 0.4 x10E3/uL   Basophils  Absolute 0.0 0.0 - 0.2 x10E3/uL   Immature Granulocytes 0 Not Estab. %   Immature Grans (Abs) 0.0 0.0 - 0.1 x10E3/uL  Ferritin   Collection Time: 09/20/23  9:54 AM  Result Value Ref Range   Ferritin 44 30 - 400 ng/mL      Assessment & Plan:   Problem List Items Addressed This Visit       Cardiovascular and Mediastinum   HTN (hypertension) (Chronic)   Chronic, historic condition  Appears well managed on current regimen comprised of Carvedilol 25 mg PO every day, Lisinopril- hydrochlorothiazide 20-12.5 mg PO every day  Continue with current regimen Refills provided today  Follow up in 6 months or sooner if concerns arise  Relevant Medications   carvedilol (COREG) 25 MG tablet   lisinopril-hydrochlorothiazide (ZESTORETIC) 20-12.5 MG tablet   rosuvastatin (CRESTOR) 20 MG tablet   Other Relevant Orders   Comp Met (CMET)     Endocrine   Acquired hypothyroidism - Primary   Chronic, stable. Continue current levothyroxine dose. Labs ordered today. Will make recommendations based on lab results.  Follow up in 6 months.  Call sooner if concerns arise.      Relevant Medications   carvedilol (COREG) 25 MG tablet   Other Relevant Orders   TSH   T4, free   Peripheral sensory neuropathy due to type 2 diabetes mellitus (HCC)   Chronic, historic condition Most recent A1c was 7.3 - recheck today. Results to dictate further management  Currently taking Ozempic 1 mg weekly injection, metformin 1000 mg PO BID, Jardiance 25 mg PO every day Checking glucose levels 3 times per day - reports fasting is usually <130 Continue current regimen for now, refills provided Follow up in 3 months or sooner if concerns arise        Relevant Medications   gabapentin (NEURONTIN) 300 MG capsule   lisinopril-hydrochlorothiazide (ZESTORETIC) 20-12.5 MG tablet   metFORMIN (GLUCOPHAGE) 1000 MG tablet   rosuvastatin (CRESTOR) 20 MG tablet   Semaglutide, 1 MG/DOSE, (OZEMPIC, 1 MG/DOSE,) 4 MG/3ML SOPN    Other Relevant Orders   Comp Met (CMET)   HgB A1c   Microalbumin, Urine Waived     Other   Obesity- BMI 38 (Chronic)   Recommended eating smaller high protein, low fat meals more frequently and exercising 30 mins a day 5 times a week with a goal of 10-15lb weight loss in the next 3 months.       Relevant Medications   metFORMIN (GLUCOPHAGE) 1000 MG tablet   Semaglutide, 1 MG/DOSE, (OZEMPIC, 1 MG/DOSE,) 4 MG/3ML SOPN   Other Relevant Orders   Lipid Profile   Iron deficiency anemia (Chronic)   Chronic.  Controlled.  Continue with current medication regimen.  Followed by Dr Cathie Hoops.  Endorses fatigue during the day.  Labs ordered today.  Return to clinic in 6 months for reevaluation.  Call sooner if concerns arise.       Relevant Orders   CBC w/Diff   Iron, TIBC and Ferritin Panel   Mixed hyperlipidemia   Chronic, controlled. Appears stable and well managed with Rosuvastatin 20 mg PO QD  Continue current medications pending lab results Recheck lipid panel today - results to dictate further management Recommend he continues with walking and makes dietary changes to assist with diabetes and cholesterol Follow up in 6 months or sooner if concerns arise        Relevant Medications   carvedilol (COREG) 25 MG tablet   lisinopril-hydrochlorothiazide (ZESTORETIC) 20-12.5 MG tablet   rosuvastatin (CRESTOR) 20 MG tablet   Other Visit Diagnoses       Need for influenza vaccination       Relevant Orders   Flu vaccine trivalent PF, 6mos and older(Flulaval,Afluria,Fluarix,Fluzone) (Completed)     Anemia, unspecified type       Relevant Medications   rosuvastatin (CRESTOR) 20 MG tablet     Vitamin D deficiency       Relevant Orders   Vitamin D (25 hydroxy)        Follow up plan: Return in about 6 months (around 03/23/2024) for Physical and Fasting labs.

## 2023-09-23 NOTE — Assessment & Plan Note (Signed)
Recommended eating smaller high protein, low fat meals more frequently and exercising 30 mins a day 5 times a week with a goal of 10-15lb weight loss in the next 3 months.  

## 2023-09-23 NOTE — Telephone Encounter (Signed)
Requested medication (s) are due for refill today - unsure  Requested medication (s) are on the active medication list -yes  Future visit scheduled -yes  Last refill: 06/21/23 #12  Notes to clinic: High dose medication- requires provider review   Requested Prescriptions  Pending Prescriptions Disp Refills   Vitamin D, Ergocalciferol, 50000 units CAPS [Pharmacy Med Name: VITAMIN D2 1.25MG (50,000 UNIT)] 12 capsule 0    Sig: TAKE 1 CAPSULE 1 TIME A WEEK     Endocrinology:  Vitamins - Vitamin D Supplementation 2 Failed - 09/23/2023  1:44 PM      Failed - Manual Review: Route requests for 50,000 IU strength to the provider      Passed - Ca in normal range and within 360 days    Calcium  Date Value Ref Range Status  06/24/2023 9.7 8.6 - 10.2 mg/dL Final   Calcium, Total  Date Value Ref Range Status  01/10/2014 8.5 8.5 - 10.1 mg/dL Final   Calcium, Ion  Date Value Ref Range Status  11/23/2014 1.19 1.12 - 1.23 mmol/L Final         Passed - Vitamin D in normal range and within 360 days    Vit D, 25-Hydroxy  Date Value Ref Range Status  03/24/2023 33.4 30.0 - 100.0 ng/mL Final    Comment:    Vitamin D deficiency has been defined by the Institute of Medicine and an Endocrine Society practice guideline as a level of serum 25-OH vitamin D less than 20 ng/mL (1,2). The Endocrine Society went on to further define vitamin D insufficiency as a level between 21 and 29 ng/mL (2). 1. IOM (Institute of Medicine). 2010. Dietary reference    intakes for calcium and D. [place]: The    [employer]. 2. Holick MF, Binkley [place], Bischoff-Ferrari HA, et al.    Evaluation, treatment, and prevention of vitamin D    deficiency: an Endocrine Society clinical practice    guideline. JCEM. 2011 Jul; 96(7):1911-30.          Passed - Valid encounter within last 12 months    Recent Outpatient Visits           Today Acquired hypothyroidism   [facility]  [name], NP   3 months ago Abnormality of penis   [place] [name] Mecum, Erin E, PA-C   6 months ago Diabetes mellitus without complication (HCC)   [place] [name] Mecum, [name], PA-C   1 year ago Encounter for annual wellness exam in Medicare patient   [place] [facility] [name], NP   1 year ago Primary hypertension   [place] [name] Mecum, [name], PA-C       Future Appointments             In 1 month [name], Laurette Schimke, MD [facility] [place]   In 1 month Vanga, Loel Dubonnet, MD [facility] [place] [facility] [place]   In 6 months [name], NP [place] [facility]               Requested Prescriptions  Pending Prescriptions Disp Refills   Vitamin D, Ergocalciferol, 50000 units CAPS [Pharmacy Med Name: VITAMIN D2 1.25MG (50,000 UNIT)] 12 capsule 0    Sig: TAKE 1 CAPSULE 1 TIME A WEEK     Endocrinology:  Vitamins - Vitamin D Supplementation 2 Failed - 09/23/2023  1:44 PM  Failed - Manual Review: Route requests for 50,000 IU strength to the provider      Passed - Ca in normal range and within 360 days    Calcium  Date Value Ref Range Status  06/24/2023 9.7 8.6 - 10.2 mg/dL Final   Calcium, Total  Date Value Ref Range Status  01/10/2014 8.5 8.5 - 10.1 mg/dL Final   Calcium, Ion  Date Value Ref Range Status  11/23/2014 1.19 1.12 - 1.23 mmol/L Final         Passed - Vitamin D in normal range and within 360 days    Vit D, 25-Hydroxy  Date Value Ref Range Status  03/24/2023 33.4 30.0 - 100.0 ng/mL Final    Comment:    Vitamin D deficiency has been defined by the Institute of Medicine and an Endocrine Society practice guideline as a level of serum 25-OH vitamin D less than 20 ng/mL (1,2). The Endocrine Society went on to further define vitamin D insufficiency as a level between 21 and 29 ng/mL (2). 1. IOM  (Institute of Medicine). 2010. Dietary reference    intakes for calcium and D. [place]: The    [employer]. 2. Holick MF, Binkley [place], Bischoff-Ferrari HA, et al.    Evaluation, treatment, and prevention of vitamin D    deficiency: an Endocrine Society clinical practice    guideline. JCEM. 2011 Jul; 96(7):1911-30.          Passed - Valid encounter within last 12 months    Recent Outpatient Visits           Today Acquired hypothyroidism   [facility] [name], NP   3 months ago Abnormality of penis   [place] [name] Mecum, Erin E, PA-C   6 months ago Diabetes mellitus without complication (HCC)   [place] [name] Mecum, [name], PA-C   1 year ago Encounter for annual wellness exam in Medicare patient   [place] [facility] [name], NP   1 year ago Primary hypertension   [place] [name] Mecum, [name], PA-C       Future Appointments             In 1 month [name], Laurette Schimke, MD [facility] [place]   In 1 month Vanga, Loel Dubonnet, MD [facility] [place] [facility] [place]   In 6 months [name], NP  [facility]

## 2023-09-23 NOTE — Assessment & Plan Note (Addendum)
Chronic, historic condition Most recent A1c was 7.3 - recheck today. Results to dictate further management  Currently taking Ozempic 1 mg weekly injection, metformin 1000 mg PO BID, Jardiance 25 mg PO every day Checking glucose levels 3 times per day - reports fasting is usually <130 Continue current regimen for now, refills provided Follow up in 3 months or sooner if concerns arise

## 2023-09-23 NOTE — Assessment & Plan Note (Signed)
Chronic, controlled. Appears stable and well managed with Rosuvastatin 20 mg PO QD  Continue current medications pending lab results Recheck lipid panel today - results to dictate further management Recommend he continues with walking and makes dietary changes to assist with diabetes and cholesterol Follow up in 6 months or sooner if concerns arise

## 2023-09-23 NOTE — Assessment & Plan Note (Signed)
Chronic, historic condition  Appears well managed on current regimen comprised of Carvedilol 25 mg PO every day, Lisinopril- hydrochlorothiazide 20-12.5 mg PO every day  Continue with current regimen Refills provided today  Follow up in 6 months or sooner if concerns arise

## 2023-09-24 LAB — COMPREHENSIVE METABOLIC PANEL
ALT: 18 [IU]/L (ref 0–44)
AST: 18 [IU]/L (ref 0–40)
Albumin: 4.4 g/dL (ref 3.9–4.9)
Alkaline Phosphatase: 66 [IU]/L (ref 44–121)
BUN/Creatinine Ratio: 13 (ref 10–24)
BUN: 11 mg/dL (ref 8–27)
Bilirubin Total: 0.3 mg/dL (ref 0.0–1.2)
CO2: 24 mmol/L (ref 20–29)
Calcium: 9.5 mg/dL (ref 8.6–10.2)
Chloride: 103 mmol/L (ref 96–106)
Creatinine, Ser: 0.82 mg/dL (ref 0.76–1.27)
Globulin, Total: 2.6 g/dL (ref 1.5–4.5)
Glucose: 85 mg/dL (ref 70–99)
Potassium: 4.3 mmol/L (ref 3.5–5.2)
Sodium: 139 mmol/L (ref 134–144)
Total Protein: 7 g/dL (ref 6.0–8.5)
eGFR: 99 mL/min/{1.73_m2} (ref >59)

## 2023-09-24 LAB — IRON,TIBC AND FERRITIN PANEL
Ferritin: 40 ng/mL (ref 30–400)
Iron Saturation: 19 % (ref 15–55)
Iron: 69 ug/dL (ref 38–169)
Total Iron Binding Capacity: 370 ug/dL (ref 250–450)
UIBC: 301 ug/dL (ref 111–343)

## 2023-09-24 LAB — HEMOGLOBIN A1C
Est. average glucose Bld gHb Est-mCnc: 154 mg/dL
Hgb A1c MFr Bld: 7 % — ABNORMAL HIGH (ref 4.8–5.6)

## 2023-09-24 LAB — T4, FREE: Free T4: 1.29 ng/dL (ref 0.82–1.77)

## 2023-09-24 LAB — LIPID PANEL
Chol/HDL Ratio: 2.8 {ratio} (ref 0.0–5.0)
Cholesterol, Total: 113 mg/dL (ref 100–199)
HDL: 41 mg/dL (ref >39)
LDL Chol Calc (NIH): 46 mg/dL (ref 0–99)
Triglycerides: 156 mg/dL — ABNORMAL HIGH (ref 0–149)
VLDL Cholesterol Cal: 26 mg/dL (ref 5–40)

## 2023-09-24 LAB — CBC WITH DIFFERENTIAL/PLATELET
Basophils Absolute: 0 10*3/uL (ref 0.0–0.2)
Basos: 1 %
EOS (ABSOLUTE): 0.2 10*3/uL (ref 0.0–0.4)
Eos: 3 %
Hematocrit: 41 % (ref 37.5–51.0)
Hemoglobin: 13.3 g/dL (ref 13.0–17.7)
Immature Grans (Abs): 0 10*3/uL (ref 0.0–0.1)
Immature Granulocytes: 0 %
Lymphocytes Absolute: 1.7 10*3/uL (ref 0.7–3.1)
Lymphs: 29 %
MCH: 30 pg (ref 26.6–33.0)
MCHC: 32.4 g/dL (ref 31.5–35.7)
MCV: 92 fL (ref 79–97)
Monocytes Absolute: 0.5 10*3/uL (ref 0.1–0.9)
Monocytes: 7 %
Neutrophils Absolute: 3.7 10*3/uL (ref 1.4–7.0)
Neutrophils: 60 %
Platelets: 290 10*3/uL (ref 150–450)
RBC: 4.44 x10E6/uL (ref 4.14–5.80)
RDW: 13 % (ref 11.6–15.4)
WBC: 6.1 10*3/uL (ref 3.4–10.8)

## 2023-09-24 LAB — VITAMIN D 25 HYDROXY (VIT D DEFICIENCY, FRACTURES): Vit D, 25-Hydroxy: 46.3 ng/mL (ref 30.0–100.0)

## 2023-09-24 LAB — TSH: TSH: 3.71 u[IU]/mL (ref 0.450–4.500)

## 2023-10-31 ENCOUNTER — Other Ambulatory Visit: Payer: Self-pay

## 2023-10-31 DIAGNOSIS — N401 Enlarged prostate with lower urinary tract symptoms: Secondary | ICD-10-CM

## 2023-11-01 ENCOUNTER — Ambulatory Visit: Payer: Medicare HMO | Admitting: Urology

## 2023-11-01 VITALS — BP 132/79 | HR 63 | Ht 61.0 in | Wt 187.8 lb

## 2023-11-01 DIAGNOSIS — R3912 Poor urinary stream: Secondary | ICD-10-CM | POA: Diagnosis not present

## 2023-11-01 DIAGNOSIS — N401 Enlarged prostate with lower urinary tract symptoms: Secondary | ICD-10-CM | POA: Diagnosis not present

## 2023-11-01 DIAGNOSIS — Z125 Encounter for screening for malignant neoplasm of prostate: Secondary | ICD-10-CM | POA: Diagnosis not present

## 2023-11-01 LAB — BLADDER SCAN AMB NON-IMAGING

## 2023-11-01 MED ORDER — TAMSULOSIN HCL 0.4 MG PO CAPS: 0.4000 mg | ORAL_CAPSULE | Freq: Every day | ORAL | 11 refills | Status: AC

## 2023-11-01 NOTE — Progress Notes (Signed)
   11/01/2023 9:49 AM   Shane Preston 02-09-1961 [id number]  Reason for visit: Follow up penile lesion, BPH/LUTS, PSA screening  HPI: 63 year old male with diabetes who I originally met in November 2024 for the above issues.  At that time he had a small penile lesion that on exam appeared benign has been stable over 1 year, reassurance was provided.  He denies any changes in the lesion since that time.  He also complained of urinary symptoms with weak stream and a sensation of incomplete emptying, and PVR is mildly elevated at .  Prostate measured 43 g from CT in June 2023.  PSA was normal at 0.5 in October 2022.  He opted for trial of Flomax.  He reports significant improvement in his urinary symptoms since starting the Flomax, and PVR is normalized today to 58ml.  We reviewed options for outlet procedures to get off medication, but he would like to continue with medicine alone at this time.  We also reviewed the correlation between diabetes and glucosuria and urinary symptoms.  Risks and benefits of PSA screening were reviewed.  We reviewed the AUA guidelines that recommend screening every 2 to 4 years.  Will repeat at follow-up in January 2026.  -Flomax refilled, consider outlet procedures in the future if worsening symptoms or desires to get off medications -RTC 1 year PVR, PSA prior   Sondra Come, MD  [facility] [address] [place], [place] [address] [phone]

## 2023-11-01 NOTE — Patient Instructions (Signed)
 Hiperplasia prosttica benigna Benign Prostatic Hyperplasia  La hiperplasia prosttica benigna (HBP) es un aumento del tamao de la prstata que es causado por el proceso normal de envejecimiento. La prstata puede aumentar de tamao a medida que un hombre envejece. Esta afeccin no es causada por Management consultant. La prstata es una glndula del tamao de una nuez que participa en la produccin de semen. Est ubicada frente al recto y debajo de la vejiga. La vejiga almacena la orina. La uretra lleva la orina hacia fuera del cuerpo. Una prstata agrandada puede presionar la uretra. Esto puede dificultar el pasaje de la orina. La acumulacin de orina en la vejiga puede causar una infeccin. La presin y la infeccin pueden provocar daos en la vejiga y una insuficiencia en los riones (renal). Cules son las causas? Esta afeccin es parte del proceso normal de envejecimiento. Sin embargo, no todos los hombres desarrollan problemas por esta afeccin. Si la prstata se agranda lejos de la uretra, el flujo de orina no se obstruir. Si se agranda hacia la uretra y la comprime, habr problemas con el paso de la [place]. Qu incrementa el riesgo? Es ms probable que Copy se desarrolle en los hombres mayores de 50 aos. Cules son los signos o sntomas? Los sntomas de esta afeccin incluyen: Levantarse con frecuencia durante la noche para orinar. Necesidad de Geographical information systems officer con ms frecuencia Administrator. Dificultad para comenzar a eliminar la orina. Disminucin del tamao y de la fuerza del chorro de [place]. Prdida (goteo) luego de Geographical information systems officer. Imposibilidad para orinar. En este caso, es necesario un tratamiento inmediato. Imposibilidad para vaciar la vejiga completamente. Dolor al Beatrix Shipper. Esto es ms comn si tambin hay una infeccin. Infeccin de las vas urinarias (IU). Cmo se diagnostica? Esta afeccin se diagnostica en funcin de los antecedentes mdicos, un examen fsico y los sntomas. Tambin se  le realizarn pruebas, por ejemplo: Un estudio despus de vaciar la vejiga. Este mide la cantidad de orina que queda en la vejiga despus de terminar de Geographical information systems officer. Examen rectal digital. En un examen rectal, el mdico controla la prstata colocando un dedo lubricado y enguantado en el recto para sentir la parte posterior de la prstata. Este examen detecta el tamao de la glndula y si hay algn bulto o crecimiento anormal. Anlisis de orina (urinlisis). Estudio de antgeno prosttico especfico (PSA). Este es un anlisis de sangre que se [place] para Arts administrator de prstata. Una ecografa. En [employer], se utilizan ondas de sonido para producir de Careers information officer una imagen de la prstata. El mdico puede derivarlo a Music therapist en enfermedades del rin y de la prstata (urlogo). Cmo se trata? Una vez que los sntomas comienzan, el mdico controlar la afeccin (vigilancia activa u observacin cautelosa). El tratamiento depender de la gravedad de la afeccin. El tratamiento puede incluir: Observacin y exmenes anuales. Este puede ser el nico tratamiento necesario si la afeccin y los sntomas son leves. Medicamentos para [employer], entre los que se incluyen los siguientes: Medicamentos para IT trainer. Medicamentos para relajar el msculo de la prstata. Kandis Ban, solo [employer] son graves. La ciruga puede incluir lo siguiente: Prostatectoma. En este procedimiento, se extrae el tejido de la prstata completamente, a travs de una incisin [place], con un laparoscopio o con robtica. Reseccin transuretral de la prstata (RTUP). En este procedimiento, se inserta una herramienta a travs de la abertura en la punta del pene (uretra). Se utiliza para cortar tejido del centro interior de la  prstata. Los trozos se retiran a Games developer de la misma abertura del pene. De este modo, se libera la obstruccin. Incisin transuretral (ITUP). En este  procedimiento, se hacen pequeos cortes en la prstata. Esto alivia la presin de la prstata sobre la uretra. Termoterapia transuretral con microondas (TTUM). En este procedimiento, se utilizan microondas para Facilities manager. El calor destruye y extirpa una pequea cantidad de tejido prosttico. Ablacin transuretral con aguja (ATUA). En este procedimiento, se utiliza la radiofrecuencia para destruir y extirpar una pequea cantidad de tejido prosttico. Coagulacin intersticial con lser (CIL). En este procedimiento, se utiliza un lser para destruir y extirpar una pequea cantidad de tejido prosttico. Electrovaporizacin transuretral (EVTU). En este procedimiento, se utilizan electrodos para destruir y extirpar una pequea cantidad de tejido prosttico. Liberacin uretral prosttica. En este procedimiento, se inserta un implante para ejercer presin en los lbulos de la prstata, en direccin contraria a la uretra. Siga estas instrucciones en su casa: Use los medicamentos de venta libre y los recetados solamente como se lo haya indicado el mdico. Controle si hay cambios en los sntomas. Hable con su mdico antes de hacer cualquier cambio. Evite beber grandes cantidades de lquido antes de irse a la cama o de salir de su casa. Evite o reduzca la cantidad de cafena o alcohol que consume. Tmese tiempo para orinar. Concurra a todas las visitas de seguimiento. Esto es importante. Comunquese con un mdico si: Siente dolor en la espalda sin explicacin. Los sntomas no mejoran con Scientist, research (medical). Los [employer] causan efectos secundarios. La orina se vuelve muy oscura o tiene mal olor. Siente que la parte inferior del abdomen est distendida y tiene problemas para Geographical information systems officer. Solicite ayuda de inmediato si: Tiene fiebre o escalofros. De repente no [employer]. Se siente aturdido o [employer], o se [place]. Observa gran cantidad de sangre o cogulos en la orina. Sus problemas urinarios se  vuelven difciles de Chief Operating Officer. Siente dolor moderado a intenso en la espalda o en la fosa lumbar. La fosa lumbar es el costado del cuerpo entre las costillas y la cadera. Estos sntomas pueden Customer service manager. Solicite ayuda de inmediato. Llame al 911. No espere a ver si los sntomas desaparecen. No conduzca por sus propios medios [employer] hospital. Resumen La hiperplasia prosttica benigna (HBP) es un aumento del tamao de la prstata que es causado por el proceso normal de envejecimiento. No es causada por Management consultant. Una prstata agrandada puede presionar la uretra. Esto puede dificultar el paso de la [place]. Es ms probable que Copy se desarrolle en los hombres mayores de 50 aos. Obtenga atencin de inmediato si de repente no puede orinar. Esta informacin no tiene Theme park manager el consejo del mdico. Asegrese de hacerle al mdico cualquier pregunta que tenga. Document Revised: 05/05/2021 Document Reviewed: 05/05/2021 Elsevier Patient Education  2024 [employer].

## 2023-11-07 ENCOUNTER — Ambulatory Visit: Payer: Medicare HMO | Admitting: Gastroenterology

## 2023-11-07 ENCOUNTER — Encounter: Payer: Self-pay | Admitting: Gastroenterology

## 2023-11-07 VITALS — BP 110/70 | HR 60 | Temp 98.0°F | Ht 61.0 in | Wt 188.4 lb

## 2023-11-07 DIAGNOSIS — Z09 Encounter for follow-up examination after completed treatment for conditions other than malignant neoplasm: Secondary | ICD-10-CM | POA: Diagnosis not present

## 2023-11-07 DIAGNOSIS — D500 Iron deficiency anemia secondary to blood loss (chronic): Secondary | ICD-10-CM

## 2023-11-07 DIAGNOSIS — K640 First degree hemorrhoids: Secondary | ICD-10-CM

## 2023-11-07 DIAGNOSIS — Z8719 Personal history of other diseases of the digestive system: Secondary | ICD-10-CM | POA: Diagnosis not present

## 2023-11-07 DIAGNOSIS — K625 Hemorrhage of anus and rectum: Secondary | ICD-10-CM

## 2023-11-07 NOTE — Progress Notes (Unsigned)
[name], MD [address]  [address]  [place], [place] [address]  Main: [phone]  Fax: [phone]    Gastroenterology Consultation  Referring Provider:     [name], NP Primary Care Physician:  [name], NP Primary Gastroenterologist:  Dr. [name] Reason for Consultation:     Rectal bleeding        HPI:   Shane Preston is a 63 y.o. male referred by Dr. [name], NP  for consultation & management of rectal bleeding.  Patient was given an urgent appointment secondary to painless rectal bleeding that started on Friday last week.  He reports that the bright red blood has been filling the toilet bowl.  He denies any constipation or diarrhea.  He spends about a minute to use the bathroom.  Patient denies any rectal pain.  He was seen by PCP yesterday, hemoglobin was 10.4, dropped from 12.8 since 07/21/2021.  Patient denies any abdominal pain, bloating.  He reports that he had similar episode about 4 or 5 years ago, underwent colonoscopy in [place] and was reportedly normal.  Patient does not work, he denies any prolonged sitting or standing.  He denies lifting weights.  He is not on any blood thinners  Follow-up visit 04/06/2022 Patient went to the ER on 6/25 secondary to generalized abdominal pain, diarrhea followed by bright red blood per rectum.  Patient reports that he woke up Sunday with the symptoms and rectal bleeding.  He reports that the abdominal pain and diarrhea have significantly improved.  He is currently having loose stools.  He had 3 loose bowel movements yesterday.  He does report abdominal bloating.  His last episode of rectal bleeding was yesterday and the quantity is less compared to initial episode.  He underwent colonoscopy in 1/23, was inadequate prep, found to have internal hemorrhoids.  He underwent ligation of right posterior hemorrhoid 6 months ago.  In the ER, his hemoglobin was normal, underwent CT abdomen  and pelvis with contrast which revealed possible colitis.  Patient had another episode of large bloody bowel movement in the ER and recommendation was made for admission.  However, patient did not want to be admitted because he has an appointment to see me today.  NSAIDs: None  Antiplts/Anticoagulants/Anti thrombotics: None  GI Procedures: Colonoscopy about 4 to 5 years ago, reportedly normal Colonoscopy 10/30/2021 - Preparation of the colon was poor. - Stool in the entire examined colon. - A single (solitary) ulcer in the distal rectum. - Non-bleeding external and internal hemorrhoids. - No specimens collected.    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   Lumbar stenosis    Neuropathy    Peripheral vascular disease (HCC)    TIA (transient ischemic attack)    Tinea pedis     Past Surgical History:  Procedure Laterality Date   BIOPSY N/A 04/20/2022   Procedure: BIOPSY;  Surgeon: Toney Reil, MD;  Location: [facility];  Service: Endoscopy;  Laterality: N/A;   CARDIAC CATHETERIZATION     [facility]    COLONOSCOPY WITH PROPOFOL N/A 10/30/2021   Procedure: COLONOSCOPY WITH PROPOFOL;  Surgeon: Toney Reil, MD;  Location: [facility];  Service: Gastroenterology;  Laterality: N/A;   COLONOSCOPY WITH PROPOFOL N/A 04/20/2022  Procedure: COLONOSCOPY WITH PROPOFOL;  Surgeon: Toney Reil, MD;  Location: [facility];  Service: Endoscopy;  Laterality: N/A;   ESOPHAGOGASTRODUODENOSCOPY (EGD) WITH PROPOFOL N/A 04/20/2022   Procedure: ESOPHAGOGASTRODUODENOSCOPY (EGD) WITH PROPOFOL;  Surgeon: Toney Reil, MD;  Location: [facility];  Service: Endoscopy;  Laterality: N/A;   EYE SURGERY     HERNIA REPAIR  10/11/2005   lumbar decompression surgery   10/11/2008    Current Outpatient Medications:    Accu-Chek Softclix Lancets lancets, USE 1 LANCET THREE TIMES A DAY, Disp: 300 each, Rfl: 2   aspirin EC 81 MG tablet, Take 81 mg by mouth daily. Swallow whole., Disp: , Rfl:    carvedilol (COREG) 25 MG tablet, Take 1 tablet (25 mg total) by mouth 2 (two) times daily., Disp: 180 tablet, Rfl: 1   cyanocobalamin (VITAMIN B12) 1000 MCG tablet, Take 1,000 mcg by mouth daily., Disp: , Rfl:    gabapentin (NEURONTIN) 300 MG capsule, Take 1 capsule (300 mg total) by mouth 3 (three) times daily., Disp: 270 capsule, Rfl: 1   glucose blood (ACCU-CHEK AVIVA PLUS) test strip, USE 1 THREE TIMES A DAY, Disp: 300 strip, Rfl: 1   hydrocortisone (ANUSOL-HC) 2.5 % rectal cream, Place 1 Application rectally 2 (two) times daily., Disp: 30 g, Rfl: 0   JARDIANCE 25 MG TABS tablet, Take 1 tablet (25 mg total) by mouth daily., Disp: 90 tablet, Rfl: 0   levothyroxine (SYNTHROID) 25 MCG tablet, Take 1 tablet (25 mcg total) by mouth every morning., Disp: 90 tablet, Rfl: 0   lisinopril-hydrochlorothiazide (ZESTORETIC) 20-12.5 MG tablet, Take 1 tablet by mouth daily., Disp: 90 tablet, Rfl: 1   Magnesium Oxide -Mg Supplement 250 MG TABS, Take 1 tablet by mouth daily., Disp: , Rfl:    metFORMIN (GLUCOPHAGE) 1000 MG tablet, Take 1 tablet (1,000 mg total) by mouth 2 (two) times daily., Disp: 180 tablet, Rfl: 1   rosuvastatin (CRESTOR) 20 MG tablet, Take 1 tablet (20 mg total) by mouth daily., Disp: 90 tablet, Rfl: 1   Semaglutide, 1 MG/DOSE, (OZEMPIC, 1 MG/DOSE,) 4 MG/3ML SOPN, Inject 1 mg into the muscle once a week., Disp: 9 mL, Rfl: 1   tamsulosin (FLOMAX) 0.4 MG CAPS capsule, Take 1 capsule (0.4 mg total) by mouth daily., Disp: 30 capsule, Rfl: 11   Vitamin D, Ergocalciferol, 50000 units CAPS, TAKE 1 CAPSULE 1 TIME A WEEK, Disp: 12 capsule, Rfl: 0   omeprazole (PRILOSEC) 20 MG capsule, Take 1 capsule (20 mg total) by mouth 2 (two) times daily before a meal for 14 days., Disp: 28  capsule, Rfl: 0  Family History  Family history unknown: Yes     Social History   Tobacco Use   Smoking status: Never   Smokeless tobacco: Never  Vaping Use   Vaping status: Never Used  Substance Use Topics   Alcohol use: Not Currently   Drug use: Not Currently    Allergies as of 11/07/2023   (No Known Allergies)    Review of Systems:    All systems reviewed and negative except where noted in HPI.   Physical Exam:  BP 110/70 (BP Location: Left Arm, Patient Position: Sitting, Cuff Size: Normal)   Pulse 60   Temp 98 F (36.7 C) (Oral)   Ht 5\' 1"  (1.549 m)   Wt 188 lb 6 oz (85.4 kg)   BMI 35.59 kg/m  No LMP for male patient.  General:   Alert,  Well-developed, well-nourished, pleasant and cooperative in NAD  Head:  Normocephalic and atraumatic. Eyes:  Sclera clear, no icterus.   Conjunctiva pink. Ears:  Normal auditory acuity. Nose:  No deformity, discharge, or lesions. Mouth:  No deformity or lesions,oropharynx pink & moist. Neck:  Supple; no masses or thyromegaly. Lungs:  Respirations even and unlabored.  Clear throughout to auscultation.   No wheezes, crackles, or rhonchi. No acute distress. Heart:  Regular rate and rhythm; no murmurs, clicks, rubs, or gallops. Abdomen:  Normal bowel sounds. Soft, non-tender and non-distended without masses, hepatosplenomegaly or hernias noted.  No guarding or rebound tenderness.   Rectal: Normal perianal exam, anoscopy revealed prolapsed internal hemorrhoids, scant amount of old blood in the anal canal.  Nontender digital rectal exam Msk:  Symmetrical without gross deformities. Good, equal movement & strength bilaterally. Pulses:  Normal pulses noted. Extremities:  No clubbing or edema.  No cyanosis. Neurologic:  Alert and oriented x3;  grossly normal neurologically. Skin:  Intact without significant lesions or rashes. No jaundice. Psych:  Alert and cooperative. Normal mood and affect.  Imaging Studies: None  Assessment and  Plan:   Shane Preston is a 63 y.o. Hispanic male with metabolic syndrome, iron deficiency anemia secondary to chronic blood loss likely from intermittent bleeding from hemorrhoids seen in for follow-up of rectal bleeding and acute diarrheal episode.  Overall, but improving  Rectal bleeding Proceed with hemorrhoid ligation today  consent obtained  Recommend upper endoscopy and colonoscopy for chronic iron deficiency anemia, abdominal bloating, loose stools   Follow up in 2 weeks for repeat banding   [name], MD

## 2023-11-10 ENCOUNTER — Encounter: Payer: Self-pay | Admitting: Gastroenterology

## 2023-11-15 ENCOUNTER — Ambulatory Visit: Payer: Medicare HMO

## 2023-11-15 VITALS — BP 112/70 | Ht 61.5 in | Wt 186.8 lb

## 2023-11-15 DIAGNOSIS — Z59819 Housing instability, housed unspecified: Secondary | ICD-10-CM

## 2023-11-15 DIAGNOSIS — Z0000 Encounter for general adult medical examination without abnormal findings: Secondary | ICD-10-CM

## 2023-11-15 DIAGNOSIS — Z5987 Material hardship due to limited financial resources, not elsewhere classified: Secondary | ICD-10-CM | POA: Diagnosis not present

## 2023-11-15 DIAGNOSIS — Z5912 Inadequate housing utilities: Secondary | ICD-10-CM | POA: Diagnosis not present

## 2023-11-15 DIAGNOSIS — Z5941 Food insecurity: Secondary | ICD-10-CM

## 2023-11-15 NOTE — Patient Instructions (Addendum)
 Mr. Shane Preston , Thank you for taking time to come for your Medicare Wellness Visit. I appreciate your ongoing commitment to your health goals. Please review the following plan we discussed and let me know if I can assist you in the future.   Referrals/Orders/Follow-Ups/Clinician Recommendations: Get the 2nd shingles vaccine after 11/24/23. I have placed a referral to community care resources to see if they can help you with food and utilities.  This is a list of the screening recommended for you and due dates:  Health Maintenance  Topic Date Due   Pneumococcal Vaccination (1 of 2 - PCV) Never done   Eye exam for diabetics  Never done   COVID-19 Vaccine (3 - 2024-25 season) 06/12/2023   Zoster (Shingles) Vaccine (2 of 2) 11/18/2023   Hemoglobin A1C  03/23/2024   Complete foot exam   06/23/2024   Yearly kidney function blood test for diabetes  09/22/2024   Yearly kidney health urinalysis for diabetes  09/22/2024   Medicare Annual Wellness Visit  11/14/2024   Colon Cancer Screening  04/20/2032   DTaP/Tdap/Td vaccine (2 - Td or Tdap) 06/24/2032   Flu Shot  Completed   Hepatitis C Screening  Completed   HIV Screening  Completed   HPV Vaccine  Aged Out    Advanced directives: (Provided) Advance directive discussed with you today. I have provided a copy for you to complete at home and have notarized. Once this is complete, please bring a copy in to our office so we can scan it into your chart.   Next Medicare Annual Wellness Visit scheduled for next year: Yes, 11/20/24 @ 10:40am ( in person visit)

## 2023-11-15 NOTE — Progress Notes (Signed)
 Subjective:   Shane Preston is a 63 y.o. male who presents for Medicare Annual/Subsequent preventive examination.  Visit Complete: In person   Cardiac Risk Factors include: advanced age (>70men, >65 women);male gender;hypertension;diabetes mellitus;dyslipidemia;obesity (BMI >30kg/m2)     Objective:    Today's Vitals   11/15/23 1013 11/15/23 1014  BP: 112/70   Weight: 186 lb 12.8 oz (84.7 kg)   Height: 5' 1.5 (1.562 m)   PainSc:  8    Body mass index is 34.72 kg/m.     11/15/2023   10:34 AM 09/07/2023    8:37 AM 02/02/2023    9:45 AM 04/20/2022    9:58 AM 04/04/2022    6:59 AM 01/26/2022    1:24 PM 10/30/2021    8:33 AM  Advanced Directives  Does Patient Have a Medical Advance Directive? No No No No No No No  Would patient like information on creating a medical advance directive? Yes (MAU/Ambulatory/Procedural Areas - Information given)   No - Patient declined No - Patient declined No - Patient declined     Current Medications (verified) Outpatient Encounter Medications as of 11/15/2023  Medication Sig   Accu-Chek Softclix Lancets lancets USE 1 LANCET THREE TIMES A DAY   aspirin EC 81 MG tablet Take 81 mg by mouth daily. Swallow whole.   carvedilol  (COREG ) 25 MG tablet Take 1 tablet (25 mg total) by mouth 2 (two) times daily.   cyanocobalamin (VITAMIN B12) 1000 MCG tablet Take 1,000 mcg by mouth daily.   gabapentin  (NEURONTIN ) 300 MG capsule Take 1 capsule (300 mg total) by mouth 3 (three) times daily.   glucose blood (ACCU-CHEK AVIVA PLUS) test strip USE 1 THREE TIMES A DAY   hydrocortisone  (ANUSOL -HC) 2.5 % rectal cream Place 1 Application rectally 2 (two) times daily.   JARDIANCE  25 MG TABS tablet Take 1 tablet (25 mg total) by mouth daily.   levothyroxine  (SYNTHROID ) 25 MCG tablet Take 1 tablet (25 mcg total) by mouth every morning.   lisinopril -hydrochlorothiazide  (ZESTORETIC ) 20-12.5 MG tablet Take 1 tablet by mouth daily.   Magnesium Oxide -Mg Supplement  250 MG TABS Take 1 tablet by mouth daily.   metFORMIN  (GLUCOPHAGE ) 1000 MG tablet Take 1 tablet (1,000 mg total) by mouth 2 (two) times daily.   omeprazole  (PRILOSEC) 20 MG capsule Take 1 capsule (20 mg total) by mouth 2 (two) times daily before a meal for 14 days.   rosuvastatin  (CRESTOR ) 20 MG tablet Take 1 tablet (20 mg total) by mouth daily.   Semaglutide , 1 MG/DOSE, (OZEMPIC , 1 MG/DOSE,) 4 MG/3ML SOPN Inject 1 mg into the muscle once a week.   tamsulosin  (FLOMAX ) 0.4 MG CAPS capsule Take 1 capsule (0.4 mg total) by mouth daily.   Vitamin D , Ergocalciferol , 50000 units CAPS TAKE 1 CAPSULE 1 TIME A WEEK   No facility-administered encounter medications on file as of 11/15/2023.    Allergies (verified) Patient has no known allergies.   History: Past Medical History:  Diagnosis Date   Anemia    Coronary artery disease    Diabetes mellitus without complication (HCC)    Hemorrhoids    Hyperlipidemia    Hypertension    IDDM (insulin  dependent diabetes mellitus) 02/06/2014   Lumbar stenosis    Neuropathy    Peripheral vascular disease (HCC)    TIA (transient ischemic attack)    Tinea pedis    Past Surgical History:  Procedure Laterality Date   BIOPSY N/A 04/20/2022   Procedure: BIOPSY;  Surgeon: Unk,  Corinn Skiff, MD;  Location: [facility];  Service: Endoscopy;  Laterality: N/A;   CARDIAC CATHETERIZATION     [facility]    COLONOSCOPY WITH PROPOFOL  N/A 10/30/2021   Procedure: COLONOSCOPY WITH PROPOFOL ;  Surgeon: Unk Corinn Skiff, MD;  Location: [facility] END[facility];  Service: Gastroenterology;  Laterality: N/A;   COLONOSCOPY WITH PROPOFOL  N/A 04/20/2022   Procedure: COLONOSCOPY WITH PROPOFOL ;  Surgeon: Unk Corinn Skiff, MD;  Location: [facility];  Service: Endoscopy;  Laterality: N/A;   ESOPHAGOGASTRODUODENOSCOPY (EGD) WITH PROPOFOL  N/A 04/20/2022   Procedure: ESOPHAGOGASTRODUODENOSCOPY (EGD) WITH PROPOFOL ;  Surgeon: Unk Corinn Skiff, MD;  Location: [facility];  Service: Endoscopy;  Laterality: N/A;   EYE SURGERY     HERNIA REPAIR  10/11/2005   lumbar decompression surgery  10/11/2008   Family History  Problem Relation Age of Onset   Other Mother        unknown medical history   Other Father 55       auto accident   Social History   Socioeconomic History   Marital status: Married    Spouse name: Dominga   Number of children: 3   Years of education: Not on file   Highest education level: Not on file  Occupational History   Occupation: disability  Tobacco Use   Smoking status: Never   Smokeless tobacco: Never  Vaping Use   Vaping status: Never Used  Substance and Sexual Activity   Alcohol use: Not Currently   Drug use: Not Currently   Sexual activity: Not Currently  Other Topics Concern   Not on file  Social History Narrative   ** Merged History Encounter **       Social Drivers of Health   Financial Resource Strain: Medium Risk (11/15/2023)   Overall Financial Resource Strain (CARDIA)    Difficulty of Paying Living Expenses: Somewhat hard  Food Insecurity: Food Insecurity Present (11/15/2023)   Hunger Vital Sign    Worried About Running Out of Food in the Last Year: Sometimes true    Ran Out of Food in the Last Year: Sometimes true  Transportation Needs: No Transportation Needs (11/15/2023)   PRAPARE - Administrator, Civil Service (Medical): No    Lack of Transportation (Non-Medical): No  Physical Activity: Sufficiently Active (11/15/2023)   Exercise Vital Sign    Days of Exercise per Week: 3 days    Minutes of Exercise per Session: 90 min  Stress: No Stress Concern Present (11/15/2023)   [employer] of Occupational Health - Occupational Stress Questionnaire    Feeling of Stress : Not at all  Social Connections: Moderately Isolated (11/15/2023)   Social Connection and Isolation Panel [NHANES]    Frequency of Communication with Friends and Family: More than three times a week    Frequency of  Social Gatherings with Friends and Family: More than three times a week    Attends Religious Services: Never    Database Administrator or Organizations: No    Attends Engineer, Structural: Never    Marital Status: Married    Tobacco Counseling Counseling given: Not Answered   Clinical Intake:  Pre-visit preparation completed: Yes  Pain : 0-10 Pain Score: 8  Pain Type: Chronic pain Pain Location: Back Pain Descriptors / Indicators: Aching     BMI - recorded: 34.72 Nutritional Status: BMI > 30  Obese Nutritional Risks: None Diabetes: Yes CBG done?: No (FBS 105 per patient) Did pt. bring in CBG monitor from  home?: No  How often do you need to have someone help you when you read instructions, pamphlets, or other written materials from your doctor or pharmacy?: 1 - Never  Interpreter Needed?: No  Information entered by :: Vina Ned, CMA   Activities of Daily Living    11/15/2023   10:17 AM  In your present state of health, do you have any difficulty performing the following activities:  Hearing? 1  Comment wears hearing aid right ear only  Vision? 0  Difficulty concentrating or making decisions? 0  Walking or climbing stairs? 1  Comment sometimes due to chronic back pain  Dressing or bathing? 0  Doing errands, shopping? 0  Preparing Food and eating ? N  Using the Toilet? N  In the past six months, have you accidently leaked urine? Y  Comment takes medication  Do you have problems with loss of bowel control? N  Managing your Medications? N  Managing your Finances? N  Housekeeping or managing your Housekeeping? N    Patient Care Team: Melvin Pao, NP as PCP - General (Nurse Practitioner) Buren Rock HERO, MD (Family Medicine) Babara Call, MD as Consulting Physician (Hematology and Oncology) Unk Corinn Skiff, MD as Consulting Physician (Gastroenterology) Darron Deatrice LABOR, MD as Consulting Physician (Cardiology)  Indicate any recent Medical  Services you may have received from other than [facility] providers in the past year (date may be approximate).     Assessment:   This is a routine wellness examination for [facility].  Hearing/Vision screen Hearing Screening - Comments:: Wears hearing aid in right ear Vision Screening - Comments:: Gets eye exams in [facility] [employer], last exam 02/2023. Called office and requested results.   Goals Addressed             This Visit's Progress    Patient Stated       Continue to exercise and lose weight      Depression Screen    11/15/2023   10:25 AM 09/23/2023   10:55 AM 06/24/2023    9:57 AM 03/24/2023    9:21 AM 09/23/2022    9:11 AM 06/24/2022   10:49 AM 03/15/2022   10:19 AM  PHQ 2/9 Scores  PHQ - 2 Score 0 0 0 0 0 0 0  PHQ- 9 Score 0 3 0 0 0 0 0    Fall Risk    11/15/2023   10:39 AM 09/23/2023   10:55 AM 06/24/2023    9:57 AM 03/24/2023    9:21 AM 09/23/2022    9:11 AM  Fall Risk   Falls in the past year? 0 0 0 0 0  Number falls in past yr: 0 0 0 0 0  Injury with Fall? 0 0 0 0 0  Risk for fall due to : No Fall Risks No Fall Risks No Fall Risks No Fall Risks No Fall Risks  Follow up Falls prevention discussed Falls evaluation completed Falls evaluation completed Falls evaluation completed Falls evaluation completed    MEDICARE RISK AT HOME: Medicare Risk at Home Any stairs in or around the home?: Yes If so, are there any without handrails?: No Home free of loose throw rugs in walkways, pet beds, electrical cords, etc?: Yes Adequate lighting in your home to reduce risk of falls?: Yes Life alert?: No Use of a cane, walker or w/c?: No Grab bars in the bathroom?: No Shower chair or bench in shower?: No Elevated toilet seat or a handicapped toilet?: No  TIMED UP AND GO:  Was the test performed?  Yes  Length of time to ambulate 10 feet: 4 sec Gait steady and fast without use of assistive device    Cognitive Function:        11/15/2023   10:40 AM   6CIT Screen  What Year? 0 points  What month? 0 points  What time? 0 points  Count back from 20 0 points  Months in reverse 0 points  Repeat phrase 6 points  Total Score 6 points    Immunizations Immunization History  Administered Date(s) Administered   Influenza, Seasonal, Injecte, Preservative Fre 09/23/2023   Influenza,inj,Quad PF,6+ Mos 07/21/2021   Influenza-Unspecified 07/11/2022   PFIZER(Purple Top)SARS-COV-2 Vaccination 02/09/2020, 03/03/2020   Tdap 06/24/2022   Zoster Recombinant(Shingrix) 09/23/2023    TDAP status: Up to date  Flu Vaccine status: Up to date  Pneumococcal vaccine status: Due, Education has been provided regarding the importance of this vaccine. Advised may receive this vaccine at local pharmacy or Health Dept. Aware to provide a copy of the vaccination record if obtained from local pharmacy or Health Dept. Verbalized acceptance and understanding.  Covid-19 vaccine status: Information provided on how to obtain vaccines.   Qualifies for Shingles Vaccine? Yes   Zostavax completed No   Shingrix Completed?: No.    Education has been provided regarding the importance of this vaccine. Patient has been advised to call insurance company to determine out of pocket expense if they have not yet received this vaccine. Advised may also receive vaccine at local pharmacy or Health Dept. Verbalized acceptance and understanding. Needs 2nd vaccine after 11/24/23  Screening Tests Health Maintenance  Topic Date Due   Pneumococcal Vaccine 106-87 Years old (1 of 2 - PCV) Never done   OPHTHALMOLOGY EXAM  Never done   Colonoscopy  04/21/2023   COVID-19 Vaccine (3 - 2024-25 season) 06/12/2023   Zoster Vaccines- Shingrix (2 of 2) 11/18/2023   HEMOGLOBIN A1C  03/23/2024   FOOT EXAM  06/23/2024   Diabetic kidney evaluation - eGFR measurement  09/22/2024   Diabetic kidney evaluation - Urine ACR  09/22/2024   Medicare Annual Wellness (AWV)  11/14/2024   DTaP/Tdap/Td (2 - Td  or Tdap) 06/24/2032   INFLUENZA VACCINE  Completed   Hepatitis C Screening  Completed   HIV Screening  Completed   HPV VACCINES  Aged Out    Health Maintenance  Health Maintenance Due  Topic Date Due   Pneumococcal Vaccine 69-67 Years old (1 of 2 - PCV) Never done   OPHTHALMOLOGY EXAM  Never done   Colonoscopy  04/21/2023   COVID-19 Vaccine (3 - 2024-25 season) 06/12/2023    Colorectal cancer screening: Type of screening: Colonoscopy. Completed 04/20/12. Repeat every 10 years.  Lung Cancer Screening: (Low Dose CT Chest recommended if Age 38-80 years, 20 pack-year currently smoking OR have quit w/in 15years.) does not qualify.   Lung Cancer Screening Referral: n/a  Additional Screening:  Hepatitis C Screening: does not qualify; Completed 09/23/22  Vision Screening: Recommended annual ophthalmology exams for early detection of glaucoma and other disorders of the eye. Is the patient up to date with their annual eye exam?  Yes , last exam 02/2023. Called and requested records from [facility] in [place] Who is the provider or what is the name of the office in which the patient attends annual eye exams? Dr. Kathie @ [facility] If pt is not established with a provider, would they like  to be referred to a provider to establish care? No .   Dental Screening: Recommended annual dental exams for proper oral hygiene  Diabetic Foot Exam: Diabetic Foot Exam: Completed 06/24/23  Community Resource Referral / Chronic Care Management: CRR required this visit?  Yes   CCM required this visit?  No     Plan:     I have personally reviewed and noted the following in the patient's chart:   Medical and social history Use of alcohol, tobacco or illicit drugs  Current medications and supplements including opioid prescriptions. Patient is not currently taking opioid prescriptions. Functional ability and status Nutritional status Physical activity Advanced  directives List of other physicians Hospitalizations, surgeries, and ER visits in previous 12 months Vitals Screenings to include cognitive, depression, and falls Referrals and appointments  In addition, I have reviewed and discussed with patient certain preventive protocols, quality metrics, and best practice recommendations. A written personalized care plan for preventive services as well as general preventive health recommendations were provided to patient.     Vina Ned, CMA   11/15/2023   After Visit Summary: (In Person-Printed) AVS printed and given to the patient  Nurse Notes:  6 CIT Score - 6 Needs pneumonia vaccine Needs 2nd shingles vaccine ~ 11/24/23 Declined DM & Nutrition education Placed VBCI referral for food, housing and utilities insecurity.

## 2023-11-16 ENCOUNTER — Telehealth: Payer: Self-pay | Admitting: *Deleted

## 2023-11-16 NOTE — Progress Notes (Signed)
 Complex Care Management Note  Care Guide Note 11/16/2023 Name: Leoncio Hansen MRN: [id number] DOB: 1960-12-25  Aloha Neldon Gallop is a 63 y.o. year old male who sees Melvin Pao, NP for primary care. I reached out to Harl Baltazar Jacobo by phone today to offer complex care management services.  Mr. Baltazar Jacobo was given information about Complex Care Management services today including:   The Complex Care Management services include support from the care team which includes your Nurse Care Manager, Clinical Social Worker, or Pharmacist.  The Complex Care Management team is here to help remove barriers to the health concerns and goals most important to you. Complex Care Management services are voluntary, and the patient may decline or stop services at any time by request to their care team member.   Complex Care Management Consent Status: Patient agreed to services and verbal consent obtained.   Follow up plan:  Telephone appointment with complex care management team member scheduled for:  11/29/2023  Encounter Outcome:  Patient Scheduled  Thedford Franks, CMA, Care Guide [facility]  [facility], [facility] Guide Direct Dial: [phone]  Fax: [phone] Website: [email or web address]

## 2023-11-25 ENCOUNTER — Other Ambulatory Visit: Payer: Self-pay | Admitting: Nurse Practitioner

## 2023-11-25 DIAGNOSIS — E119 Type 2 diabetes mellitus without complications: Secondary | ICD-10-CM

## 2023-11-25 NOTE — Telephone Encounter (Signed)
Requested Prescriptions  Pending Prescriptions Disp Refills   JARDIANCE 25 MG TABS tablet [Pharmacy Med Name: JARDIANCE 25 MG TABLET] 90 tablet 0    Sig: Take 1 tablet (25 mg total) by mouth daily.     Endocrinology:  Diabetes - SGLT2 Inhibitors Passed - 11/25/2023 12:24 PM      Passed - Cr in normal range and within 360 days    Creatinine  Date Value Ref Range Status  01/10/2014 0.82 0.60 - 1.30 mg/dL Final   Creatinine, Ser  Date Value Ref Range Status  09/23/2023 0.82 0.76 - 1.27 mg/dL Final         Passed - HBA1C is between 0 and 7.9 and within 180 days    HB A1C (BAYER DCA - WAIVED)  Date Value Ref Range Status  12/30/2021 6.2 (H) 4.8 - 5.6 % Final    Comment:             Prediabetes: 5.7 - 6.4          Diabetes: >6.4          Glycemic control for adults with diabetes: <7.0    Hgb A1c MFr Bld  Date Value Ref Range Status  09/23/2023 7.0 (H) 4.8 - 5.6 % Final    Comment:             Prediabetes: 5.7 - 6.4          Diabetes: >6.4          Glycemic control for adults with diabetes: <7.0          Passed - eGFR in normal range and within 360 days    EGFR (African American)  Date Value Ref Range Status  01/10/2014 >60  Final   GFR calc Af Amer  Date Value Ref Range Status  12/09/2019 >60 >60 mL/min Final   EGFR (Non-African Amer.)  Date Value Ref Range Status  01/10/2014 >60  Final    Comment:    eGFR values <26mL/min/1.73 m2 may be an indication of chronic kidney disease (CKD). Calculated eGFR is useful in patients with stable renal function. The eGFR calculation will not be reliable in acutely ill patients when serum creatinine is changing rapidly. It is not useful in  patients on dialysis. The eGFR calculation may not be applicable to patients at the low and high extremes of body sizes, pregnant women, and vegetarians.    GFR, Estimated  Date Value Ref Range Status  04/04/2022 >60 >60 mL/min Final    Comment:    (NOTE) Calculated using the CKD-EPI  Creatinine Equation (2021)    eGFR  Date Value Ref Range Status  09/23/2023 99 >59 mL/min/1.73 Final         Passed - Valid encounter within last 6 months    Recent Outpatient Visits           2 months ago Acquired hypothyroidism   [place] [facility] [name], NP   5 months ago Abnormality of penis   [place] [name] Mecum, Erin E, PA-C   8 months ago Diabetes mellitus without complication (HCC)   [place] [name] Mecum, [name], PA-C   1 year ago Encounter for annual wellness exam in Medicare patient   [place] [facility] [name], NP   1 year ago Primary hypertension   [place] [name] Mecum, [name], PA-C       Future Appointments  In 4 months [name], NP [facility]   In 11 months [name], Laurette Schimke, MD [facility] [place]

## 2023-11-29 ENCOUNTER — Ambulatory Visit: Payer: Self-pay

## 2023-11-29 NOTE — Patient Instructions (Signed)
 Visit Information  Thank you for taking time to visit with me today. Please don't hesitate to contact me if I can be of assistance to you.   Following are the goals we discussed today:  Patient to apply for Medicaid and Foodstamps. Patient to contact additional food banks for assistance.   If you are experiencing a Mental Health or Behavioral Health Crisis or need someone to talk to, please call 911  Patient verbalizes understanding of instructions and care plan provided today and agrees to view in MyChart. Active MyChart status and patient understanding of how to access instructions and care plan via MyChart confirmed with patient.     No further follow up required: Patient does not request a follow up visit.  Lysle Morales, BSW [place]  [facility], [facility] Acute Care Social Worker Direct Dial: [phone]  Fax: [phone] Website: [email or web address]

## 2023-11-29 NOTE — Patient Outreach (Signed)
  Care Coordination   Initial Visit Note   11/29/2023 Name: Shane Preston MRN: [id number] DOB: Jul 22, 1961  Shane Preston is a 63 y.o. year old male who sees [name], NP for primary care. I spoke with  Shane Preston by phone today.  What matters to the patients health and wellness today?  Patient needs additional resources for food.    Goals Addressed             This Visit's Progress    Care Coordination Activities       Interventions Today    Flowsheet Row Most Recent Value  Chronic Disease   Chronic disease during today's visit Diabetes, Hypertension (HTN)  General Interventions   General Interventions Discussed/Reviewed General Interventions Discussed, General Interventions Reviewed, [employer] and wife both receive SSA.Pt also receives extra help from [facility].Wifes check pays medicare premium and very little is left.They use food banks but its not enough.SW provides additional food banks,refers to DSS for Medicaid & Foodstamps.]  Education Interventions   Education Provided Provided Education  [SW educated patient on Medicare and Medicaid. SW explained there is a type of Medicaid that will pay the Medicare premium to allow more funds for daily expenses.]              SDOH assessments and interventions completed:  Yes  SDOH Interventions Today    Flowsheet Row Most Recent Value  SDOH Interventions   Food Insecurity Interventions Other (Comment)  [Uses food banks and [employer] extra assistance, family helps]  Housing Interventions Other (Comment)  [Patient receives SSA to afford rent]  Transportation Interventions Intervention Not Indicated  Utilities Interventions Intervention Not Indicated  [Patient uses SSA to pay for water and lights]        Care Coordination Interventions:  Yes, provided   Follow up plan: No further intervention required.   Encounter Outcome:  Patient Visit Completed

## 2023-12-05 ENCOUNTER — Inpatient Hospital Stay: Payer: Medicare HMO | Attending: Oncology

## 2023-12-05 DIAGNOSIS — D509 Iron deficiency anemia, unspecified: Secondary | ICD-10-CM

## 2023-12-05 DIAGNOSIS — D500 Iron deficiency anemia secondary to blood loss (chronic): Secondary | ICD-10-CM | POA: Diagnosis not present

## 2023-12-05 DIAGNOSIS — Z79899 Other long term (current) drug therapy: Secondary | ICD-10-CM | POA: Diagnosis not present

## 2023-12-05 LAB — CBC WITH DIFFERENTIAL (CANCER CENTER ONLY)
Abs Immature Granulocytes: 0.06 10*3/uL (ref 0.00–0.07)
Basophils Absolute: 0 10*3/uL (ref 0.0–0.1)
Basophils Relative: 1 %
Eosinophils Absolute: 0.1 10*3/uL (ref 0.0–0.5)
Eosinophils Relative: 2 %
HCT: 45.5 % (ref 39.0–52.0)
Hemoglobin: 15.4 g/dL (ref 13.0–17.0)
Immature Granulocytes: 1 %
Lymphocytes Relative: 32 %
Lymphs Abs: 2 10*3/uL (ref 0.7–4.0)
MCH: 30.1 pg (ref 26.0–34.0)
MCHC: 33.8 g/dL (ref 30.0–36.0)
MCV: 88.9 fL (ref 80.0–100.0)
Monocytes Absolute: 0.4 10*3/uL (ref 0.1–1.0)
Monocytes Relative: 6 %
Neutro Abs: 3.7 10*3/uL (ref 1.7–7.7)
Neutrophils Relative %: 58 %
Platelet Count: 285 10*3/uL (ref 150–400)
RBC: 5.12 MIL/uL (ref 4.22–5.81)
RDW: 12.4 % (ref 11.5–15.5)
WBC Count: 6.2 10*3/uL (ref 4.0–10.5)
nRBC: 0 % (ref 0.0–0.2)

## 2023-12-05 LAB — IRON AND TIBC
Iron: 93 ug/dL (ref 45–182)
Saturation Ratios: 21 % (ref 17.9–39.5)
TIBC: 440 ug/dL (ref 250–450)
UIBC: 347 ug/dL

## 2023-12-05 LAB — FERRITIN: Ferritin: 23 ng/mL — ABNORMAL LOW (ref 24–336)

## 2023-12-08 ENCOUNTER — Inpatient Hospital Stay (HOSPITAL_BASED_OUTPATIENT_CLINIC_OR_DEPARTMENT_OTHER): Payer: Medicare HMO | Admitting: Oncology

## 2023-12-08 ENCOUNTER — Encounter: Payer: Self-pay | Admitting: Oncology

## 2023-12-08 ENCOUNTER — Inpatient Hospital Stay: Payer: Medicare HMO

## 2023-12-08 VITALS — BP 108/71 | HR 64 | Temp 97.8°F | Resp 18 | Wt 182.9 lb

## 2023-12-08 DIAGNOSIS — D509 Iron deficiency anemia, unspecified: Secondary | ICD-10-CM | POA: Diagnosis not present

## 2023-12-08 DIAGNOSIS — D500 Iron deficiency anemia secondary to blood loss (chronic): Secondary | ICD-10-CM | POA: Diagnosis not present

## 2023-12-08 DIAGNOSIS — Z79899 Other long term (current) drug therapy: Secondary | ICD-10-CM | POA: Diagnosis not present

## 2023-12-08 NOTE — Progress Notes (Signed)
 Pt here for follow up. No new concerns voiced.

## 2023-12-08 NOTE — Progress Notes (Signed)
 Hematology/Oncology Progress note [phone] [phone]         Patient Care Team: [name], NP as PCP - General (Nurse Practitioner) Rickard Patience, MD as Consulting Physician (Hematology and Oncology) Toney Reil, MD as Consulting Physician (Gastroenterology) Iran Ouch, MD as Consulting Physician (Cardiology) [facility], Dr. Sharia Reeve (Optometry)  ASSESSMENT & PLAN:   Iron deficiency anemia Iron deficiency anemia secondary to chronic GI blood loss. Previously tolerated IV Venofer treatments Labs reviewed and discussed patient  Lab Results  Component Value Date   IRON 93 12/05/2023   TIBC 440 12/05/2023   IRONPCTSAT 21 12/05/2023   FERRITIN 23 (L) 12/05/2023    Hemoglobin has normalized No need for IV venofer, slightly decreased ferritin.  Continue otc iron supplementation daily for 2 weeks. Then stop.    spanish interpretor presents for entire encounter.  Patient is discharged from my clinic. I recommend patient to continue follow up with primary care physician. Patient may re-establish care in the future if clinically indicated.   Rickard Patience, MD, PhD [facility] Hematology Oncology 12/08/2023   CHIEF COMPLAINTS/REASON FOR VISIT:  Follow-up for iron deficiency anemia HISTORY OF PRESENTING ILLNESS:   Shane Preston is a  63 y.o.  male with PMH listed below was seen in consultation at the request of  [name], NP  for evaluation of anemia 10/13/2021, hemoglobin 10.4, MCV 80. 10/14/2021, iron panel showed iron saturation 7, ferritin 8, TIBC 417. 10/21/2021, patient had a CBC done which showed a hemoglobin of 9.8, MCV 81. Review previous lab results.  Anemia is new onset, since October 2022. Patient reports painless rectal bleeding.  Has been referred to establish care with gastroenterology Dr. Allegra Lai and has a colonoscopy scheduled.  10/30/2021, patient had colonoscopy done by Dr. Allegra Lai colonoscopy  showed poor preparation of the colon.  Solitary ulcer in the distal rectum.  Patient was recommended to follow-up in 6 months with 2-day prep. 04/20/22 repeat colonoscopy  INTERVAL HISTORY Shane Preston Colonel Bald is a 63 y.o. male who has above history reviewed by me today presents for follow up visit for Iron deficiency anemia S/p internal hemorrhoids dispose ligation x 3 No rectal bleeding again.  He denies any constipation.    Review of Systems  Constitutional:  Negative for appetite change, chills, fatigue, fever and unexpected weight change.  HENT:   Negative for hearing loss and voice change.   Eyes:  Negative for eye problems and icterus.  Respiratory:  Negative for chest tightness, cough and shortness of breath.   Cardiovascular:  Negative for chest pain and leg swelling.  Gastrointestinal:  Negative for abdominal distention, abdominal pain, blood in stool and rectal pain.  Endocrine: Negative for hot flashes.  Genitourinary:  Negative for difficulty urinating, dysuria and frequency.   Musculoskeletal:  Negative for arthralgias.  Skin:  Negative for itching and rash.  Neurological:  Negative for light-headedness and numbness.  Hematological:  Negative for adenopathy. Does not bruise/bleed easily.  Psychiatric/Behavioral:  Negative for confusion.     MEDICAL HISTORY:  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   Lumbar stenosis    Neuropathy    Peripheral vascular disease (HCC)    TIA (transient ischemic attack)    Tinea pedis     SURGICAL  HISTORY: Past Surgical History:  Procedure Laterality Date   BIOPSY N/A 04/20/2022   Procedure: BIOPSY;  Surgeon: Toney Reil, MD;  Location[facility];  Service: Endoscopy;  Laterality: N/A;   CARDIAC CATHETERIZATION     [facility]    COLONOSCOPY WITH PROPOFOL N/A 10/30/2021   Procedure: COLONOSCOPY WITH PROPOFOL;  Surgeon: Toney Reil, MD;  Location: [facility] END[facility]COPY;  Service: Gastroenterology;  Laterality: N/A;   COLONOSCOPY WITH PROPOFOL N/A 04/20/2022   Procedure: COLONOSCOPY WITH PROPOFOL;  Surgeon: Toney Reil, MD;  Location: [facility];  Service: Endoscopy;  Laterality: N/A;   ESOPHAGOGASTRODUODENOSCOPY (EGD) WITH PROPOFOL N/A 04/20/2022   Procedure: ESOPHAGOGASTRODUODENOSCOPY (EGD) WITH PROPOFOL;  Surgeon: Toney Reil, MD;  Location: [facility];  Service: Endoscopy;  Laterality: N/A;   EYE SURGERY     HERNIA REPAIR  10/11/2005   lumbar decompression surgery  10/11/2008    SOCIAL HISTORY: Social History   Socioeconomic History   Marital status: Married    Spouse name: Dominga   Number of children: 3   Years of education: Not on file   Highest education level: Not on file  Occupational History   Occupation: disability  Tobacco Use   Smoking status: Never   Smokeless tobacco: Never  Vaping Use   Vaping status: Never Used  Substance and Sexual Activity   Alcohol use: Not Currently   Drug use: Not Currently   Sexual activity: Not Currently  Other Topics Concern   Not on file  Social History Narrative   ** Merged History Encounter **       Social Drivers of Health   Financial Resource Strain: Medium Risk (11/15/2023)   Overall Financial Resource Strain (CARDIA)    Difficulty of Paying Living Expenses: Somewhat hard  Food Insecurity: Food Insecurity Present (11/29/2023)   Hunger Vital Sign    Worried About Running Out of Food in the Last Year: Sometimes true    Ran Out of Food in the Last Year: Sometimes true  Transportation Needs: No Transportation Needs (11/29/2023)   PRAPARE - Administrator, Civil Service (Medical): No    Lack of Transportation (Non-Medical): No  Physical Activity: Sufficiently Active (11/15/2023)   Exercise Vital Sign     Days of Exercise per Week: 3 days    Minutes of Exercise per Session: 90 min  Stress: No Stress Concern Present (11/15/2023)   [employer] of Occupational Health - Occupational Stress Questionnaire    Feeling of Stress : Not at all  Social Connections: Moderately Isolated (11/15/2023)   Social Connection and Isolation Panel [NHANES]    Frequency of Communication with Friends and Family: More than three times a week    Frequency of Social Gatherings with Friends and Family: More than three times a week    Attends Religious Services: Never    Database administrator or Organizations: No    Attends Banker Meetings: Never    Marital Status: Married  Catering manager Violence: Not At Risk (11/29/2023)   Humiliation, Afraid, Rape, and Kick questionnaire    Fear of Current or Ex-Partner: No    Emotionally Abused: No    Physically Abused: No    Sexually Abused: No    FAMILY HISTORY: Family History  Problem Relation Age of Onset   Other Mother        unknown medical history   Other Father 6       auto accident  ALLERGIES:  has no known allergies.  MEDICATIONS:  Current Outpatient Medications  Medication Sig Dispense Refill   Accu-Chek Softclix Lancets lancets USE 1 LANCET THREE TIMES A DAY 300 each 2   aspirin EC 81 MG tablet Take 81 mg by mouth daily. Swallow whole.     carvedilol (COREG) 25 MG tablet Take 1 tablet (25 mg total) by mouth 2 (two) times daily. 180 tablet 1   cyanocobalamin (VITAMIN B12) 1000 MCG tablet Take 1,000 mcg by mouth daily.     gabapentin (NEURONTIN) 300 MG capsule Take 1 capsule (300 mg total) by mouth 3 (three) times daily. 270 capsule 1   glucose blood (ACCU-CHEK AVIVA PLUS) test strip USE 1 THREE TIMES A DAY 300 strip 1   hydrocortisone (ANUSOL-HC) 2.5 % rectal cream Place 1 Application rectally 2 (two) times daily. 30 g 0   JARDIANCE 25 MG TABS tablet Take 1 tablet (25 mg total) by mouth daily. 90 tablet 1   levothyroxine  (SYNTHROID) 25 MCG tablet Take 1 tablet (25 mcg total) by mouth every morning. 90 tablet 0   lisinopril-hydrochlorothiazide (ZESTORETIC) 20-12.5 MG tablet Take 1 tablet by mouth daily. 90 tablet 1   Magnesium Oxide -Mg Supplement 250 MG TABS Take 1 tablet by mouth daily.     metFORMIN (GLUCOPHAGE) 1000 MG tablet Take 1 tablet (1,000 mg total) by mouth 2 (two) times daily. 180 tablet 1   omeprazole (PRILOSEC) 20 MG capsule Take 1 capsule (20 mg total) by mouth 2 (two) times daily before a meal for 14 days. 28 capsule 0   rosuvastatin (CRESTOR) 20 MG tablet Take 1 tablet (20 mg total) by mouth daily. 90 tablet 1   Semaglutide, 1 MG/DOSE, (OZEMPIC, 1 MG/DOSE,) 4 MG/3ML SOPN Inject 1 mg into the muscle once a week. 9 mL 1   tamsulosin (FLOMAX) 0.4 MG CAPS capsule Take 1 capsule (0.4 mg total) by mouth daily. 30 capsule 11   Vitamin D, Ergocalciferol, 50000 units CAPS TAKE 1 CAPSULE 1 TIME A WEEK 12 capsule 0   No current facility-administered medications for this visit.     PHYSICAL EXAMINATION: ECOG PERFORMANCE STATUS: 1 - Symptomatic but completely ambulatory Vitals:   12/08/23 1014  BP: 108/71  Pulse: 64  Resp: 18  Temp: 97.8 F (36.6 C)   Filed Weights   12/08/23 1014  Weight: 182 lb 14.4 oz (83 kg)    Physical Exam Constitutional:      General: He is not in acute distress. HENT:     Head: Normocephalic and atraumatic.  Eyes:     General: No scleral icterus. Cardiovascular:     Rate and Rhythm: Normal rate.  Pulmonary:     Effort: Pulmonary effort is normal. No respiratory distress.  Abdominal:     General: There is no distension.  Musculoskeletal:        General: Normal range of motion.     Cervical back: Normal range of motion and neck supple.  Skin:    Findings: No rash.  Neurological:     Mental Status: He is alert and oriented to person, place, and time. Mental status is at baseline.  Psychiatric:        Mood and Affect: Mood normal.     LABORATORY DATA:  I  have reviewed the data as listed    Latest Ref Rng & Units 12/05/2023   10:03 AM 09/23/2023   11:04 AM 09/20/2023    9:54 AM  CBC  WBC 4.0 - 10.5  K/uL 6.2  6.1  6.4   Hemoglobin 13.0 - 17.0 g/dL 16.1  09.6  04.5   Hematocrit 39.0 - 52.0 % 45.5  41.0  45.1   Platelets 150 - 400 K/uL 285  290  302       Latest Ref Rng & Units 09/23/2023   11:04 AM 06/24/2023   10:33 AM 03/24/2023    9:49 AM  CMP  Glucose 70 - 99 mg/dL 85  409  811   BUN 8 - 27 mg/dL 11  9  13    Creatinine 0.76 - 1.27 mg/dL 9.14  7.82  9.56   Sodium 134 - 144 mmol/L 139  141  140   Potassium 3.5 - 5.2 mmol/L 4.3  4.2  4.3   Chloride 96 - 106 mmol/L 103  104  101   CO2 20 - 29 mmol/L 24  21  23    Calcium 8.6 - 10.2 mg/dL 9.5  9.7  9.9   Total Protein 6.0 - 8.5 g/dL 7.0  7.2  7.4   Total Bilirubin 0.0 - 1.2 mg/dL 0.3  0.4  0.5   Alkaline Phos 44 - 121 IU/L 66  68  68   AST 0 - 40 IU/L 18  18  20    ALT 0 - 44 IU/L 18  18  19     Lab Results  Component Value Date   IRON 93 12/05/2023   TIBC 440 12/05/2023   FERRITIN 23 (L) 12/05/2023       RADIOGRAPHIC STUDIES: I have personally reviewed the radiological images as listed and agreed with the findings in the report. No results found.

## 2023-12-08 NOTE — Assessment & Plan Note (Addendum)
 Iron deficiency anemia secondary to chronic GI blood loss. Previously tolerated IV Venofer treatments Labs reviewed and discussed patient  Lab Results  Component Value Date   IRON 93 12/05/2023   TIBC 440 12/05/2023   IRONPCTSAT 21 12/05/2023   FERRITIN 23 (L) 12/05/2023    Hemoglobin has normalized No need for IV venofer, slightly decreased ferritin.  Continue otc iron supplementation daily for 2 weeks. Then stop.

## 2023-12-21 LAB — HM DIABETES EYE EXAM

## 2023-12-27 ENCOUNTER — Other Ambulatory Visit: Payer: Self-pay | Admitting: Nurse Practitioner

## 2023-12-27 DIAGNOSIS — Z8639 Personal history of other endocrine, nutritional and metabolic disease: Secondary | ICD-10-CM

## 2023-12-28 NOTE — Telephone Encounter (Signed)
 Requested medication (s) are due for refill today: yes  Requested medication (s) are on the active medication list: yes  Last refill:  06/21/23 #12/0  Future visit scheduled: yes  Notes to clinic:  Unable to refill per protocol, cannot delegate.      Requested Prescriptions  Pending Prescriptions Disp Refills   Vitamin D, Ergocalciferol, 50000 units CAPS [Pharmacy Med Name: VITAMIN D2 1.25MG (50,000 UNIT)] 12 capsule 0    Sig: TAKE 1 CAPSULE 1 TIME A WEEK     Endocrinology:  Vitamins - Vitamin D Supplementation 2 Failed - 12/28/2023  1:06 PM      Failed - Manual Review: Route requests for 50,000 IU strength to the provider      Passed - Ca in normal range and within 360 days    Calcium  Date Value Ref Range Status  09/23/2023 9.5 8.6 - 10.2 mg/dL Final   Calcium, Total  Date Value Ref Range Status  01/10/2014 8.5 8.5 - 10.1 mg/dL Final   Calcium, Ion  Date Value Ref Range Status  11/23/2014 1.19 1.12 - 1.23 mmol/L Final         Passed - Vitamin D in normal range and within 360 days    Vit D, 25-Hydroxy  Date Value Ref Range Status  09/23/2023 46.3 30.0 - 100.0 ng/mL Final    Comment:    Vitamin D deficiency has been defined by the Institute of Medicine and an Endocrine Society practice guideline as a level of serum 25-OH vitamin D less than 20 ng/mL (1,2). The Endocrine Society went on to further define vitamin D insufficiency as a level between 21 and 29 ng/mL (2). 1. IOM (Institute of Medicine). 2010. Dietary reference    intakes for calcium and D. [place]: The    [employer]. 2. Holick MF, Binkley , Bischoff-Ferrari HA, et al.    Evaluation, treatment, and prevention of vitamin D    deficiency: an Endocrine Society clinical practice    guideline. JCEM. 2011 Jul; 96(7):1911-30.          Passed - Valid encounter within last 12 months    Recent Outpatient Visits           3 months ago Acquired hypothyroidism   [place] [facility] [name], NP   6 months ago Abnormality of penis   [place] [name] Mecum, [name], PA-C   9 months ago Diabetes mellitus without complication (HCC)   [place] [name] Mecum, [name], PA-C   1 year ago Encounter for annual wellness exam in Medicare patient   [place] [facility] [name], NP   1 year ago Primary hypertension   [place] [name] Mecum, [name], PA-C       Future Appointments             In 2 months [name], NP [place] [facility]   In 10 months [name], Laurette Schimke, MD [facility] [place]

## 2024-01-03 ENCOUNTER — Other Ambulatory Visit: Payer: Self-pay | Admitting: Nurse Practitioner

## 2024-01-03 DIAGNOSIS — Z8639 Personal history of other endocrine, nutritional and metabolic disease: Secondary | ICD-10-CM

## 2024-01-03 NOTE — Telephone Encounter (Signed)
 Prescription Request  01/03/2024  LOV: 09/23/2023  What is the name of the medication or equipment? Vitamin D, Ergocalciferol, 50000 units   Have you contacted your pharmacy to request a refill? Yes   Which pharmacy would you like this sent to?  [facility] - [place], [place] - [address] [place] [address] Phone: [phone] Fax: [phone]    Patient notified that their request is being sent to the clinical staff for review and that they should receive a response within 2 business days.   Please advise at Mobile [phone] (mobile)

## 2024-01-18 ENCOUNTER — Other Ambulatory Visit: Payer: Self-pay | Admitting: Nurse Practitioner

## 2024-01-19 NOTE — Telephone Encounter (Signed)
 Requested Prescriptions  Pending Prescriptions Disp Refills   levothyroxine (SYNTHROID) 25 MCG tablet [Pharmacy Med Name: LEVOTHYROXINE 25 MCG TABLET] 90 tablet 1    Sig: Take 1 tablet (25 mcg total) by mouth every morning.     Endocrinology:  Hypothyroid Agents Passed - 01/19/2024  9:57 AM      Passed - TSH in normal range and within 360 days    TSH  Date Value Ref Range Status  09/23/2023 3.710 0.450 - 4.500 uIU/mL Final         Passed - Valid encounter within last 12 months    Recent Outpatient Visits   None     Future Appointments             In 2 months [name], NP [place] [facility], [facility]   In 9 months [name], Laurette Schimke, MD [facility] [place]

## 2024-01-20 ENCOUNTER — Telehealth: Payer: Self-pay | Admitting: Nurse Practitioner

## 2024-01-20 NOTE — Telephone Encounter (Signed)
 RX not due according to chart. Contacted [employer], refill was picked up by the patient this morning.

## 2024-01-20 NOTE — Telephone Encounter (Signed)
 Prescription Request  01/20/2024  LOV: 09/23/2023  What is the name of the medication or equipment? metFORMIN (GLUCOPHAGE) 1000 MG tablet   Have you contacted your pharmacy to request a refill? Yes   Which pharmacy would you like this sent to?  [facility] - [place], [place] - [address] [place] [address] Phone: [phone] Fax: [phone]    Patient notified that their request is being sent to the clinical staff for review and that they should receive a response within 2 business days.   Please advise at Mobile [phone] (mobile)

## 2024-03-12 ENCOUNTER — Other Ambulatory Visit: Payer: Self-pay | Admitting: Nurse Practitioner

## 2024-03-12 DIAGNOSIS — E1142 Type 2 diabetes mellitus with diabetic polyneuropathy: Secondary | ICD-10-CM

## 2024-03-13 NOTE — Telephone Encounter (Signed)
 Requested medication (s) are due for refill today: yes  Requested medication (s) are on the active medication list: yes  Last refill:  09/23/23 9 ml 1 RF  Future visit scheduled: yes  Notes to clinic:  abnormal lab work    Requested Prescriptions  Pending Prescriptions Disp Refills   OZEMPIC , 1 MG/DOSE, 4 MG/3ML SOPN [Pharmacy Med Name: OZEMPIC  1 MG DOSE PEN] 9 mL 0    Sig: Inject 1 mg into the muscle once a week.     Endocrinology:  Diabetes - GLP-1 Receptor Agonists - semaglutide  Failed - 03/13/2024 11:27 AM      Failed - HBA1C in normal range and within 180 days    HB A1C (BAYER DCA - WAIVED)  Date Value Ref Range Status  12/30/2021 6.2 (H) 4.8 - 5.6 % Final    Comment:             Prediabetes: 5.7 - 6.4          Diabetes: >6.4          Glycemic control for adults with diabetes: <7.0    Hgb A1c MFr Bld  Date Value Ref Range Status  09/23/2023 7.0 (H) 4.8 - 5.6 % Final    Comment:             Prediabetes: 5.7 - 6.4          Diabetes: >6.4          Glycemic control for adults with diabetes: <7.0          Passed - Cr in normal range and within 360 days    Creatinine  Date Value Ref Range Status  01/10/2014 0.82 0.60 - 1.30 mg/dL Final   Creatinine, Ser  Date Value Ref Range Status  09/23/2023 0.82 0.76 - 1.27 mg/dL Final         Passed - Valid encounter within last 6 months    Recent Outpatient Visits   None     Future Appointments             In 1 week Aileen Alexanders, NP [place] [facility], [facility]   In 7 months Estanislao Heimlich, Dennard Fisher, MD [facility] [place]

## 2024-03-26 ENCOUNTER — Ambulatory Visit: Payer: Self-pay | Admitting: Nurse Practitioner

## 2024-03-26 ENCOUNTER — Encounter: Payer: Self-pay | Admitting: Nurse Practitioner

## 2024-03-26 VITALS — BP 117/76 | HR 52 | Temp 97.6°F | Ht 61.42 in | Wt 185.0 lb

## 2024-03-26 DIAGNOSIS — E6609 Other obesity due to excess calories: Secondary | ICD-10-CM

## 2024-03-26 DIAGNOSIS — D649 Anemia, unspecified: Secondary | ICD-10-CM

## 2024-03-26 DIAGNOSIS — E039 Hypothyroidism, unspecified: Secondary | ICD-10-CM

## 2024-03-26 DIAGNOSIS — K219 Gastro-esophageal reflux disease without esophagitis: Secondary | ICD-10-CM

## 2024-03-26 DIAGNOSIS — E1142 Type 2 diabetes mellitus with diabetic polyneuropathy: Secondary | ICD-10-CM | POA: Diagnosis not present

## 2024-03-26 DIAGNOSIS — E782 Mixed hyperlipidemia: Secondary | ICD-10-CM

## 2024-03-26 DIAGNOSIS — I10 Essential (primary) hypertension: Secondary | ICD-10-CM

## 2024-03-26 DIAGNOSIS — Z0000 Encounter for general adult medical examination without abnormal findings: Secondary | ICD-10-CM | POA: Diagnosis not present

## 2024-03-26 DIAGNOSIS — E1140 Type 2 diabetes mellitus with diabetic neuropathy, unspecified: Secondary | ICD-10-CM | POA: Diagnosis not present

## 2024-03-26 DIAGNOSIS — E119 Type 2 diabetes mellitus without complications: Secondary | ICD-10-CM | POA: Diagnosis not present

## 2024-03-26 DIAGNOSIS — D509 Iron deficiency anemia, unspecified: Secondary | ICD-10-CM

## 2024-03-26 MED ORDER — JARDIANCE 25 MG PO TABS: 25.0000 mg | ORAL_TABLET | Freq: Every day | ORAL | 1 refills | Status: DC

## 2024-03-26 MED ORDER — CARVEDILOL 25 MG PO TABS: 25.0000 mg | ORAL_TABLET | Freq: Two times a day (BID) | ORAL | 1 refills | Status: DC

## 2024-03-26 MED ORDER — OMEPRAZOLE 20 MG PO CPDR
20.0000 mg | DELAYED_RELEASE_CAPSULE | Freq: Two times a day (BID) | ORAL | 1 refills | Status: DC
Start: 2024-03-26 — End: 2024-08-02

## 2024-03-26 MED ORDER — METFORMIN HCL 1000 MG PO TABS: 1000.0000 mg | ORAL_TABLET | Freq: Two times a day (BID) | ORAL | 1 refills | Status: DC

## 2024-03-26 MED ORDER — ROSUVASTATIN CALCIUM 20 MG PO TABS: 20.0000 mg | ORAL_TABLET | Freq: Every day | ORAL | 1 refills | Status: DC

## 2024-03-26 MED ORDER — LEVOTHYROXINE SODIUM 25 MCG PO TABS: 25.0000 ug | ORAL_TABLET | Freq: Every morning | ORAL | 1 refills | Status: DC

## 2024-03-26 MED ORDER — GABAPENTIN 300 MG PO CAPS
300.0000 mg | ORAL_CAPSULE | Freq: Three times a day (TID) | ORAL | 1 refills | Status: DC
Start: 2024-03-26 — End: 2024-08-02

## 2024-03-26 MED ORDER — LISINOPRIL-HYDROCHLOROTHIAZIDE 20-12.5 MG PO TABS: 1.0000 | ORAL_TABLET | Freq: Every day | ORAL | 1 refills | Status: DC

## 2024-03-26 NOTE — Assessment & Plan Note (Signed)
 Chronic.  Controlled.  Continue with current medication regimen.  Followed by Dr Wilhelmenia Harada.  Not currently experiencing fatigue.  Labs ordered today.  Return to clinic in 6 months for reevaluation.  Call sooner if concerns arise.

## 2024-03-26 NOTE — Assessment & Plan Note (Signed)
 Recommended eating smaller high protein, low fat meals more frequently and exercising 30 mins a day 5 times a week with a goal of 10-15lb weight loss in the next 3 months.

## 2024-03-26 NOTE — Assessment & Plan Note (Signed)
 Chronic.  Controlled.  Continue with current medication regimen.  Labs ordered today.  Return to clinic in 6 months for reevaluation.  Call sooner if concerns arise.  ? ?

## 2024-03-26 NOTE — Assessment & Plan Note (Signed)
 Chronic, controlled. Continue on current regimen comprised of Carvedilol  25 mg PO every day, Lisinopril - hydrochlorothiazide  20-12.5 mg PO every day  Refills provided today  Follow up in 6 months or sooner if concerns arise

## 2024-03-26 NOTE — Assessment & Plan Note (Signed)
 Chronic, controlled. Appears stable and well managed with Rosuvastatin 20 mg PO QD  Continue current medications pending lab results Recheck lipid panel today - results to dictate further management Recommend he continues with walking and makes dietary changes to assist with diabetes and cholesterol Follow up in 6 months or sooner if concerns arise

## 2024-03-26 NOTE — Progress Notes (Signed)
 BP 117/76   Pulse (!) 52   Temp 97.6 F (36.4 C) (Oral)   Ht 5' 1.42 (1.56 m)   Wt 185 lb (83.9 kg)   SpO2 98%   BMI 34.48 kg/m    Subjective:    Patient ID: Shane Preston, male    DOB: 10-07-1961, 63 y.o.   MRN: [id number]  HPI: Shane Preston is a 63 y.o. male presenting on 03/26/2024 for comprehensive medical examination. Current medical complaints include:none  He currently lives with: Interim Problems from his last visit: no  HYPERTENSION / HYPERLIPIDEMIA Satisfied with current treatment? yes Duration of hypertension: years BP monitoring frequency: not checking BP range:  BP medication side effects: no Past BP meds: carvdeilol and lisinopril -HCTZ Duration of hyperlipidemia: years Cholesterol medication side effects: no Cholesterol supplements: none Past cholesterol medications: rosuvastatin  (crestor ) Medication compliance: excellent compliance Aspirin: no Recent stressors: no Recurrent headaches: no Visual changes: no Palpitations: no Dyspnea: no Chest pain: no Lower extremity edema: no Dizzy/lightheaded: no  DIABETES Hypoglycemic episodes:no Polydipsia/polyuria: no Visual disturbance: no Chest pain: no Paresthesias: no Glucose Monitoring: yes  Accucheck frequency: daily  Fasting glucose: 117-130  Post prandial:  Evening:  Before meals: Taking Insulin ?: no  Long acting insulin :  Short acting insulin : Blood Pressure Monitoring: daily Retinal Examination: Up to Date Foot Exam: Up to Date Diabetic Education: Not Completed Pneumovax: Up to Date Influenza: Up to Date Aspirin: no  ANEMIA Anemia status: controlled Etiology of anemia: Duration of anemia treatment:  Compliance with treatment: excellent compliance Iron  supplementation side effects: no Severity of anemia: severe Fatigue: no Decreased exercise tolerance: no  Dyspnea on exertion: no Palpitations: no Bleeding: no Pica: no  HYPOTHYROIDISM Thyroid  control  status:controlled Satisfied with current treatment? no Medication side effects: no Medication compliance: excellent compliance Etiology of hypothyroidism:  Recent dose adjustment:no Fatigue: no Cold intolerance: no Heat intolerance: no Weight gain: no Weight loss: no Constipation: no Diarrhea/loose stools: no Palpitations: no Lower extremity edema: no Anxiety/depressed mood: no  Depression Screen done today and results listed below:     03/26/2024    9:39 AM 11/15/2023   10:25 AM 09/23/2023   10:55 AM 06/24/2023    9:57 AM 03/24/2023    9:21 AM  Depression screen PHQ 2/9  Decreased Interest 0 0 0 0 0  Down, Depressed, Hopeless 0 0 0 0 0  PHQ - 2 Score 0 0 0 0 0  Altered sleeping 0 0 3 0 0  Tired, decreased energy 0 0 0 0 0  Change in appetite 0 0 0 0 0  Feeling bad or failure about yourself  0 0 0 0 0  Trouble concentrating 0 0 0 0 0  Moving slowly or fidgety/restless 0 0 0 0 0  Suicidal thoughts 0 0 0 0 0  PHQ-9 Score 0 0 3 0 0  Difficult doing work/chores Not difficult at all Not difficult at all Not difficult at all Not difficult at all Not difficult at all    The patient does not have a history of falls. I did complete a risk assessment for falls. A plan of care for falls was documented.   Past Medical History:  Past Medical History:  Diagnosis Date   Anemia    Coronary artery disease    Diabetes mellitus without complication (HCC)    Hemorrhoids    Hyperlipidemia    Hypertension    IDDM (insulin  dependent diabetes mellitus) 02/06/2014   Lumbar stenosis    Neuropathy  Peripheral vascular disease (HCC)    TIA (transient ischemic attack)    Tinea pedis     Surgical History:  Past Surgical History:  Procedure Laterality Date   BIOPSY N/A 04/20/2022   Procedure: BIOPSY;  Surgeon: Selena Daily, MD;  Location: [facility];  Service: Endoscopy;  Laterality: N/A;   CARDIAC CATHETERIZATION     [facility]    COLONOSCOPY WITH PROPOFOL  N/A  10/30/2021   Procedure: COLONOSCOPY WITH PROPOFOL ;  Surgeon: Selena Daily, MD;  Location: [facility];  Service: Gastroenterology;  Laterality: N/A;   COLONOSCOPY WITH PROPOFOL  N/A 04/20/2022   Procedure: COLONOSCOPY WITH PROPOFOL ;  Surgeon: Selena Daily, MD;  Location: [facility];  Service: Endoscopy;  Laterality: N/A;   ESOPHAGOGASTRODUODENOSCOPY (EGD) WITH PROPOFOL  N/A 04/20/2022   Procedure: ESOPHAGOGASTRODUODENOSCOPY (EGD) WITH PROPOFOL ;  Surgeon: Selena Daily, MD;  Location: [facility];  Service: Endoscopy;  Laterality: N/A;   EYE SURGERY     HERNIA REPAIR  10/11/2005   lumbar decompression surgery  10/11/2008    Medications:  Current Outpatient Medications on File Prior to Visit  Medication Sig   Accu-Chek Softclix Lancets lancets USE 1 LANCET THREE TIMES A DAY   aspirin EC 81 MG tablet Take 81 mg by mouth daily. Swallow whole.   cyanocobalamin (VITAMIN B12) 1000 MCG tablet Take 1,000 mcg by mouth daily.   glucose blood (ACCU-CHEK AVIVA PLUS) test strip USE 1 THREE TIMES A DAY   hydrocortisone  (ANUSOL -HC) 2.5 % rectal cream Place 1 Application rectally 2 (two) times daily.   Magnesium Oxide -Mg Supplement 250 MG TABS Take 1 tablet by mouth daily.   Semaglutide , 1 MG/DOSE, (OZEMPIC , 1 MG/DOSE,) 4 MG/3ML SOPN Inject 1 mg into the muscle once a week.   tamsulosin  (FLOMAX ) 0.4 MG CAPS capsule Take 1 capsule (0.4 mg total) by mouth daily.   Vitamin D , Ergocalciferol , 50000 units CAPS TAKE 1 CAPSULE 1 TIME A WEEK   No current facility-administered medications on file prior to visit.    Allergies:  No Known Allergies  Social History:  Social History   Socioeconomic History   Marital status: Married    Spouse name: Dominga   Number of children: 3   Years of education: Not on file   Highest education level: Not on file  Occupational History   Occupation: disability  Tobacco Use   Smoking status: Never   Smokeless tobacco: Never  Vaping  Use   Vaping status: Never Used  Substance and Sexual Activity   Alcohol use: Not Currently   Drug use: Not Currently   Sexual activity: Not Currently  Other Topics Concern   Not on file  Social History Narrative   ** Merged History Encounter **       Social Drivers of Health   Financial Resource Strain: Medium Risk (11/15/2023)   Overall Financial Resource Strain (CARDIA)    Difficulty of Paying Living Expenses: Somewhat hard  Food Insecurity: Food Insecurity Present (11/29/2023)   Hunger Vital Sign    Worried About Running Out of Food in the Last Year: Sometimes true    Ran Out of Food in the Last Year: Sometimes true  Transportation Needs: No Transportation Needs (11/29/2023)   PRAPARE - Administrator, Civil Service (Medical): No    Lack of Transportation (Non-Medical): No  Physical Activity: Sufficiently Active (11/15/2023)   Exercise Vital Sign    Days of Exercise per Week: 3 days    Minutes of Exercise per  Session: 90 min  Stress: No Stress Concern Present (11/15/2023)   [employer] of Occupational Health - Occupational Stress Questionnaire    Feeling of Stress : Not at all  Social Connections: Moderately Isolated (11/15/2023)   Social Connection and Isolation Panel    Frequency of Communication with Friends and Family: More than three times a week    Frequency of Social Gatherings with Friends and Family: More than three times a week    Attends Religious Services: Never    Database administrator or Organizations: No    Attends Banker Meetings: Never    Marital Status: Married  Catering manager Violence: Not At Risk (11/29/2023)   Humiliation, Afraid, Rape, and Kick questionnaire    Fear of Current or Ex-Partner: No    Emotionally Abused: No    Physically Abused: No    Sexually Abused: No   Social History   Tobacco Use  Smoking Status Never  Smokeless Tobacco Never   Social History   Substance and Sexual Activity  Alcohol Use Not  Currently    Family History:  Family History  Problem Relation Age of Onset   Other Mother        unknown medical history   Other Father 79       auto accident    Past medical history, surgical history, medications, allergies, family history and social history reviewed with patient today and changes made to appropriate areas of the chart.   Review of Systems  Constitutional:  Negative for malaise/fatigue and weight loss.  HENT:         Denies vision changes.  Eyes:  Negative for blurred vision and double vision.  Respiratory:  Negative for shortness of breath.   Cardiovascular:  Negative for chest pain, palpitations and leg swelling.  Gastrointestinal:  Negative for constipation and diarrhea.  Neurological:  Negative for dizziness, tingling and headaches.  Endo/Heme/Allergies:  Negative for polydipsia.       Denies Polyuria  Psychiatric/Behavioral:  Negative for depression. The patient is not nervous/anxious.    All other ROS negative except what is listed above and in the HPI.      Objective:    BP 117/76   Pulse (!) 52   Temp 97.6 F (36.4 C) (Oral)   Ht 5' 1.42 (1.56 m)   Wt 185 lb (83.9 kg)   SpO2 98%   BMI 34.48 kg/m   Wt Readings from Last 3 Encounters:  03/26/24 185 lb (83.9 kg)  12/08/23 182 lb 14.4 oz (83 kg)  11/15/23 186 lb 12.8 oz (84.7 kg)    Physical Exam Vitals and nursing note reviewed.  Constitutional:      General: He is not in acute distress.    Appearance: Normal appearance. He is not ill-appearing, toxic-appearing or diaphoretic.  HENT:     Head: Normocephalic.     Right Ear: Tympanic membrane, ear canal and external ear normal.     Left Ear: Tympanic membrane, ear canal and external ear normal.     Nose: Nose normal. No congestion or rhinorrhea.     Mouth/Throat:     Mouth: Mucous membranes are moist.   Eyes:     General:        Right eye: No discharge.        Left eye: No discharge.     Extraocular Movements: Extraocular movements  intact.     Conjunctiva/sclera: Conjunctivae normal.     Pupils: Pupils are equal, round, and  reactive to light.    Cardiovascular:     Rate and Rhythm: Normal rate and regular rhythm.     Heart sounds: No murmur heard. Pulmonary:     Effort: Pulmonary effort is normal. No respiratory distress.     Breath sounds: Normal breath sounds. No wheezing, rhonchi or rales.  Abdominal:     General: Abdomen is flat. Bowel sounds are normal. There is no distension.     Palpations: Abdomen is soft.     Tenderness: There is no abdominal tenderness. There is no guarding.   Musculoskeletal:     Cervical back: Normal range of motion and neck supple.   Skin:    General: Skin is warm and dry.     Capillary Refill: Capillary refill takes less than 2 seconds.   Neurological:     General: No focal deficit present.     Mental Status: He is alert and oriented to person, place, and time.     Cranial Nerves: No cranial nerve deficit.     Motor: No weakness.     Deep Tendon Reflexes: Reflexes normal.   Psychiatric:        Mood and Affect: Mood normal.        Behavior: Behavior normal.        Thought Content: Thought content normal.        Judgment: Judgment normal.     Results for orders placed or performed in visit on 12/21/23  HM DIABETES EYE EXAM   Collection Time: 12/21/23 11:53 AM  Result Value Ref Range   HM Diabetic Eye Exam No Retinopathy No Retinopathy      Assessment & Plan:   Problem List Items Addressed This Visit       Cardiovascular and Mediastinum   HTN (hypertension) (Chronic)   Chronic, controlled. Continue on current regimen comprised of Carvedilol  25 mg PO every day, Lisinopril - hydrochlorothiazide  20-12.5 mg PO every day  Refills provided today  Follow up in 6 months or sooner if concerns arise       Relevant Medications   carvedilol  (COREG ) 25 MG tablet   lisinopril -hydrochlorothiazide  (ZESTORETIC ) 20-12.5 MG tablet   rosuvastatin  (CRESTOR ) 20 MG tablet      Digestive   Gastroesophageal reflux disease   Chronic.  Controlled.  Continue with current medication regimen.  Labs ordered today.  Return to clinic in 6 months for reevaluation.  Call sooner if concerns arise.        Relevant Medications   omeprazole  (PRILOSEC) 20 MG capsule     Endocrine   Acquired hypothyroidism   Chronic.  Controlled.  Continue with current medication regimen.  Labs ordered today.  Return to clinic in 6 months for reevaluation.  Call sooner if concerns arise.        Relevant Medications   carvedilol  (COREG ) 25 MG tablet   levothyroxine  (SYNTHROID ) 25 MCG tablet   Other Relevant Orders   TSH   T4   Peripheral sensory neuropathy due to type 2 diabetes mellitus (HCC)   Relevant Medications   gabapentin  (NEURONTIN ) 300 MG capsule   JARDIANCE  25 MG TABS tablet   lisinopril -hydrochlorothiazide  (ZESTORETIC ) 20-12.5 MG tablet   metFORMIN  (GLUCOPHAGE ) 1000 MG tablet   rosuvastatin  (CRESTOR ) 20 MG tablet   Type 2 diabetes mellitus with diabetic neuropathy, unspecified (HCC)   Chronic, controlled.  Most recent A1c was 7.0 - recheck today. Results to dictate further management  Currently taking Ozempic  1 mg weekly injection, metformin  1000 mg PO BID, Jardiance  25  mg PO every day Reports fasting is usually 117-130 Continue current regimen for now, refills provided Follow up in 6 months or sooner if concerns arise        Relevant Medications   JARDIANCE  25 MG TABS tablet   lisinopril -hydrochlorothiazide  (ZESTORETIC ) 20-12.5 MG tablet   metFORMIN  (GLUCOPHAGE ) 1000 MG tablet   rosuvastatin  (CRESTOR ) 20 MG tablet   Other Relevant Orders   Hemoglobin A1c     Other   Obesity- BMI 38 (Chronic)   Recommended eating smaller high protein, low fat meals more frequently and exercising 30 mins a day 5 times a week with a goal of 10-15lb weight loss in the next 3 months.       Relevant Medications   JARDIANCE  25 MG TABS tablet   metFORMIN  (GLUCOPHAGE ) 1000 MG tablet    Iron  deficiency anemia (Chronic)   Chronic.  Controlled.  Continue with current medication regimen.  Followed by Dr Wilhelmenia Harada.  Not currently experiencing fatigue.  Labs ordered today.  Return to clinic in 6 months for reevaluation.  Call sooner if concerns arise.       Relevant Orders   CBC with Differential/Platelet   Mixed hyperlipidemia   Chronic, controlled. Appears stable and well managed with Rosuvastatin  20 mg PO QD  Continue current medications pending lab results Recheck lipid panel today - results to dictate further management Recommend he continues with walking and makes dietary changes to assist with diabetes and cholesterol Follow up in 6 months or sooner if concerns arise       Relevant Medications   carvedilol  (COREG ) 25 MG tablet   lisinopril -hydrochlorothiazide  (ZESTORETIC ) 20-12.5 MG tablet   rosuvastatin  (CRESTOR ) 20 MG tablet   Other Relevant Orders   Lipid panel   Other Visit Diagnoses       Annual physical exam    -  Primary   Health maintenance reviewed during visit today.  Labs ordered.  Vaccines reivewed.  Colonoscopy up to date.   Relevant Orders   TSH   PSA   Lipid panel   CBC with Differential/Platelet   Comprehensive metabolic panel with GFR   Hemoglobin A1c   T4     Diabetes mellitus without complication (HCC)       Relevant Medications   JARDIANCE  25 MG TABS tablet   lisinopril -hydrochlorothiazide  (ZESTORETIC ) 20-12.5 MG tablet   metFORMIN  (GLUCOPHAGE ) 1000 MG tablet   rosuvastatin  (CRESTOR ) 20 MG tablet     Anemia, unspecified type       Relevant Medications   rosuvastatin  (CRESTOR ) 20 MG tablet        Discussed aspirin prophylaxis for myocardial infarction prevention and decision was it was not indicated  LABORATORY TESTING:  Health maintenance labs ordered today as discussed above.   The natural history of prostate cancer and ongoing controversy regarding screening and potential treatment outcomes of prostate cancer has been discussed  with the patient. The meaning of a false positive PSA and a false negative PSA has been discussed. He indicates understanding of the limitations of this screening test and wishes to proceed with screening PSA testing.   IMMUNIZATIONS:   - Tdap: Tetanus vaccination status reviewed: last tetanus booster within 10 years. - Influenza: Postponed to flu season - Pneumovax: Will get at next visit - Prevnar: Not applicable - COVID: Not applicable - HPV: Not applicable - Shingrix vaccine: Up to date  SCREENING: - Colonoscopy: Up to date  Discussed with patient purpose of the colonoscopy is to detect colon cancer at curable  precancerous or early stages   - AAA Screening: Not applicable  -Hearing Test: Not applicable  -Spirometry: Not applicable   PATIENT COUNSELING:    Sexuality: Discussed sexually transmitted diseases, partner selection, use of condoms, avoidance of unintended pregnancy  and contraceptive alternatives.   Advised to avoid cigarette smoking.  I discussed with the patient that most people either abstain from alcohol or drink within safe limits (<=14/week and <=4 drinks/occasion for males, <=7/weeks and <= 3 drinks/occasion for females) and that the risk for alcohol disorders and other health effects rises proportionally with the number of drinks per week and how often a drinker exceeds daily limits.  Discussed cessation/primary prevention of drug use and availability of treatment for abuse.   Diet: Encouraged to adjust caloric intake to maintain  or achieve ideal body weight, to reduce intake of dietary saturated fat and total fat, to limit sodium intake by avoiding high sodium foods and not adding table salt, and to maintain adequate dietary potassium and calcium  preferably from fresh fruits, vegetables, and low-fat dairy products.    stressed the importance of regular exercise  Injury prevention: Discussed safety belts, safety helmets, smoke detector, smoking near bedding or  upholstery.   Dental health: Discussed importance of regular tooth brushing, flossing, and dental visits.   Follow up plan: NEXT PREVENTATIVE PHYSICAL DUE IN 1 YEAR. Return in about 6 months (around 09/25/2024) for HTN, HLD, DM2 FU.

## 2024-03-26 NOTE — Assessment & Plan Note (Signed)
 Chronic, controlled.  Most recent A1c was 7.0 - recheck today. Results to dictate further management  Currently taking Ozempic  1 mg weekly injection, metformin  1000 mg PO BID, Jardiance  25 mg PO every day Reports fasting is usually 117-130 Continue current regimen for now, refills provided Follow up in 6 months or sooner if concerns arise

## 2024-03-27 ENCOUNTER — Ambulatory Visit: Payer: Self-pay | Admitting: Nurse Practitioner

## 2024-03-27 LAB — CBC WITH DIFFERENTIAL/PLATELET
Basophils Absolute: 0 10*3/uL (ref 0.0–0.2)
Basos: 0 %
EOS (ABSOLUTE): 0.1 10*3/uL (ref 0.0–0.4)
Eos: 2 %
Hematocrit: 43.2 % (ref 37.5–51.0)
Hemoglobin: 13.9 g/dL (ref 13.0–17.7)
Immature Grans (Abs): 0 10*3/uL (ref 0.0–0.1)
Immature Granulocytes: 0 %
Lymphocytes Absolute: 1.6 10*3/uL (ref 0.7–3.1)
Lymphs: 28 %
MCH: 30.7 pg (ref 26.6–33.0)
MCHC: 32.2 g/dL (ref 31.5–35.7)
MCV: 95 fL (ref 79–97)
Monocytes Absolute: 0.3 10*3/uL (ref 0.1–0.9)
Monocytes: 6 %
Neutrophils Absolute: 3.6 10*3/uL (ref 1.4–7.0)
Neutrophils: 64 %
Platelets: 269 10*3/uL (ref 150–450)
RBC: 4.53 x10E6/uL (ref 4.14–5.80)
RDW: 13.6 % (ref 11.6–15.4)
WBC: 5.7 10*3/uL (ref 3.4–10.8)

## 2024-03-27 LAB — COMPREHENSIVE METABOLIC PANEL WITH GFR
ALT: 18 IU/L (ref 0–44)
AST: 15 IU/L (ref 0–40)
Albumin: 4.5 g/dL (ref 3.9–4.9)
Alkaline Phosphatase: 62 IU/L (ref 44–121)
BUN/Creatinine Ratio: 15 (ref 10–24)
BUN: 12 mg/dL (ref 8–27)
Bilirubin Total: 0.3 mg/dL (ref 0.0–1.2)
CO2: 21 mmol/L (ref 20–29)
Calcium: 9.2 mg/dL (ref 8.6–10.2)
Chloride: 105 mmol/L (ref 96–106)
Creatinine, Ser: 0.82 mg/dL (ref 0.76–1.27)
Globulin, Total: 2.3 g/dL (ref 1.5–4.5)
Glucose: 94 mg/dL (ref 70–99)
Potassium: 4.3 mmol/L (ref 3.5–5.2)
Sodium: 141 mmol/L (ref 134–144)
Total Protein: 6.8 g/dL (ref 6.0–8.5)
eGFR: 98 mL/min/{1.73_m2} (ref >59)

## 2024-03-27 LAB — LIPID PANEL
Chol/HDL Ratio: 2.6 ratio (ref 0.0–5.0)
Cholesterol, Total: 119 mg/dL (ref 100–199)
HDL: 46 mg/dL (ref >39)
LDL Chol Calc (NIH): 51 mg/dL (ref 0–99)
Triglycerides: 125 mg/dL (ref 0–149)
VLDL Cholesterol Cal: 22 mg/dL (ref 5–40)

## 2024-03-27 LAB — T4: T4, Total: 7 ug/dL (ref 4.5–12.0)

## 2024-03-27 LAB — PSA: Prostate Specific Ag, Serum: 0.9 ng/mL (ref 0.0–4.0)

## 2024-03-27 LAB — HEMOGLOBIN A1C
Est. average glucose Bld gHb Est-mCnc: 131 mg/dL
Hgb A1c MFr Bld: 6.2 % — ABNORMAL HIGH (ref 4.8–5.6)

## 2024-03-27 LAB — TSH: TSH: 3.04 u[IU]/mL (ref 0.450–4.500)

## 2024-05-17 ENCOUNTER — Encounter: Payer: Self-pay | Admitting: Urology

## 2024-06-15 NOTE — Telephone Encounter (Deleted)
 mistake

## 2024-07-17 NOTE — Progress Notes (Signed)
 Shane Preston                                          MRN: [id number]   07/17/2024   The [facility] Quality Team Specialist reviewed this patient medical record for the purposes of chart review for care gap closure. The following were reviewed: chart review for care gap closure-kidney health evaluation for diabetes:eGFR  and uACR.    [facility] Quality Team

## 2024-07-21 ENCOUNTER — Other Ambulatory Visit: Payer: Self-pay | Admitting: Nurse Practitioner

## 2024-07-24 NOTE — Telephone Encounter (Signed)
 Requested Prescriptions  Pending Prescriptions Disp Refills   Accu-Chek Softclix Lancets lancets [Pharmacy Med Name: ACCU-CHEK SOFTCLIX LANCETS] 300 each 1    Sig: USE 1 LANCET THREE TIMES A DAY     Endocrinology: Diabetes - Testing Supplies Passed - 07/24/2024 12:45 PM      Passed - Valid encounter within last 12 months    Recent Outpatient Visits           4 months ago Annual physical exam   [facility] Melvin Pao, NP       Future Appointments             In 3 months Francisca, Redell BROCKS, MD [facility] [place]

## 2024-08-02 ENCOUNTER — Telehealth: Payer: Self-pay | Admitting: Nurse Practitioner

## 2024-08-02 DIAGNOSIS — D649 Anemia, unspecified: Secondary | ICD-10-CM

## 2024-08-02 DIAGNOSIS — E119 Type 2 diabetes mellitus without complications: Secondary | ICD-10-CM

## 2024-08-02 DIAGNOSIS — E1142 Type 2 diabetes mellitus with diabetic polyneuropathy: Secondary | ICD-10-CM

## 2024-08-02 DIAGNOSIS — I10 Essential (primary) hypertension: Secondary | ICD-10-CM

## 2024-08-02 DIAGNOSIS — K219 Gastro-esophageal reflux disease without esophagitis: Secondary | ICD-10-CM

## 2024-08-02 MED ORDER — ROSUVASTATIN CALCIUM 20 MG PO TABS: 20.0000 mg | ORAL_TABLET | Freq: Every day | ORAL | 1 refills | Status: DC

## 2024-08-02 MED ORDER — OZEMPIC (1 MG/DOSE) 4 MG/3ML ~~LOC~~ SOPN: 1.0000 mg | PEN_INJECTOR | SUBCUTANEOUS | 1 refills | Status: DC

## 2024-08-02 MED ORDER — LEVOTHYROXINE SODIUM 25 MCG PO TABS: 25.0000 ug | ORAL_TABLET | Freq: Every morning | ORAL | 1 refills | Status: DC

## 2024-08-02 MED ORDER — JARDIANCE 25 MG PO TABS: 25.0000 mg | ORAL_TABLET | Freq: Every day | ORAL | 1 refills | Status: DC

## 2024-08-02 MED ORDER — CARVEDILOL 25 MG PO TABS: 25.0000 mg | ORAL_TABLET | Freq: Two times a day (BID) | ORAL | 1 refills | Status: DC

## 2024-08-02 MED ORDER — OMEPRAZOLE 20 MG PO CPDR: 20.0000 mg | DELAYED_RELEASE_CAPSULE | Freq: Two times a day (BID) | ORAL | 1 refills | Status: DC

## 2024-08-02 MED ORDER — LISINOPRIL-HYDROCHLOROTHIAZIDE 20-12.5 MG PO TABS: 1.0000 | ORAL_TABLET | Freq: Every day | ORAL | 1 refills | Status: DC

## 2024-08-02 MED ORDER — METFORMIN HCL 1000 MG PO TABS: 1000.0000 mg | ORAL_TABLET | Freq: Two times a day (BID) | ORAL | 1 refills | Status: DC

## 2024-08-02 MED ORDER — GABAPENTIN 300 MG PO CAPS: 300.0000 mg | ORAL_CAPSULE | Freq: Three times a day (TID) | ORAL | 1 refills | Status: DC

## 2024-08-02 NOTE — Telephone Encounter (Signed)
 Patient walked in and requested refills of medications.  States he will be out before his next appt.

## 2024-09-03 ENCOUNTER — Ambulatory Visit: Admitting: Family Medicine

## 2024-09-03 ENCOUNTER — Telehealth: Payer: Self-pay | Admitting: Nurse Practitioner

## 2024-09-03 ENCOUNTER — Other Ambulatory Visit: Payer: Self-pay | Admitting: Family Medicine

## 2024-09-03 VITALS — BP 120/74 | HR 66 | Temp 98.0°F | Ht 61.25 in | Wt 186.0 lb

## 2024-09-03 DIAGNOSIS — K625 Hemorrhage of anus and rectum: Secondary | ICD-10-CM

## 2024-09-03 MED ORDER — HYDROCORTISONE (PERIANAL) 2.5 % EX CREA: 1.0000 | TOPICAL_CREAM | Freq: Two times a day (BID) | CUTANEOUS | 0 refills | Status: AC

## 2024-09-03 NOTE — Telephone Encounter (Signed)
 Prescription Request  09/03/2024  LOV: 03/26/2024  What is the name of the medication or equipment? rosuvastatin  (CRESTOR ) 20 MG tablet   lisinopril -hydrochlorothiazide  (ZESTORETIC ) 20-12.5 MG  Have you contacted your pharmacy to request a refill? No   Which pharmacy would you like this sent to?  [facility] - [place], [place] - [address] [place] [address] Phone: [phone] Fax: [phone]    Patient notified that their request is being sent to the clinical staff for review and that they should receive a response within 2 business days.   Please advise at Mobile [phone] (mobile)

## 2024-09-03 NOTE — Progress Notes (Signed)
 09/03/2024 Shane Preston [id number] Jan 07, 1961  Gastroenterology Office Note     Primary Care Physician:  Melvin Pao, NP  Primary GI Provider: Jinny Carmine, MD    Chief Complaint   Chief Complaint  Patient presents with   Follow-up    Abdominal pain/rectal bleeding on this past Friday-denies diarrhea/constipation-no nausea or vomiting-     History of Present Illness   Shane Preston is a 63 y.o. male with PMHX of hemorrhoids with banding, iron  deficiency anemia presenting today for rectal bleeding.   Patient reports that Friday afternoon he started having rectal bleeding again.  Patient has a history of hemorrhoid banding and reports that he has not had further episodes of rectal bleeding since around December 2024.  Patient reports he is seeing a lot of bright red blood in the toilet each time that he has a bowel movement, the same as when he had rectal bleeding before.  He has used some rectal cream, but is still having the bleeding.  He has also noticed mild abdominal pain when he feels like he has to go but then it resolves after having a bowel movement.  He denies having to strain or hard stools.  Denies rectal pain or itching.   Patient last seen by Dr. Unk on 11/07/2023 for follow up of rectal bleeding and anemia. Symptoms had resolved.   05/03/2022 , 05/19/2022, and 06/10/2022 hemorrhoid banding with Dr. Unk.  04/20/2022 Colonoscopy - The examined portion of the ileum was normal.  - Non-bleeding external and internal hemorrhoids.  - A single (solitary) ulcer in the distal rectum.  - No specimens collected.  04/20/2022 Upper endoscopy - Normal duodenal bulb and second portion of the duodenum. Biopsied. - Erythematous mucosa in the gastric fundus, gastric body and antrum. Biopsied. SEVERE CHRONIC ACTIVE GASTRITIS WITH MILD GLANDULAR ATROPHY.  ORGANISMS OF HELICOBACTER PYLORI SEEN ON HE SECTIONS  - Esophagogastric landmarks identified.   - Normal gastroesophageal junction and esophagus   Past Medical History:  Diagnosis Date   Anemia    Cataract 03/25/2021   Claudication, intermittent 01/16/2013   Colon cancer screening 07/12/2014   Coronary artery disease    Diabetes mellitus without complication (HCC)    Hemorrhoids    Hyperlipidemia    Hypertension    IDDM (insulin  dependent diabetes mellitus) 02/06/2014   Lumbar stenosis    Neuropathy    Peripheral vascular disease    Screening examination for sexually transmitted disease 05/05/2021   Spinal stenosis, lumbar 01/17/2009   TIA (transient ischemic attack)    Tinea pedis    Well controlled type 2 diabetes mellitus (HCC) 04/03/2008    Past Surgical History:  Procedure Laterality Date   BIOPSY N/A 04/20/2022   Procedure: BIOPSY;  Surgeon: Unk Corinn Skiff, MD;  Location: [facility];  Service: Endoscopy;  Laterality: N/A;   CARDIAC CATHETERIZATION     [facility]    COLONOSCOPY WITH PROPOFOL  N/A 10/30/2021   Procedure: COLONOSCOPY WITH PROPOFOL ;  Surgeon: Unk Corinn Skiff, MD;  Location: [facility] [place] [facility];  Service: Gastroenterology;  Laterality: N/A;   COLONOSCOPY WITH PROPOFOL  N/A 04/20/2022   Procedure: COLONOSCOPY WITH PROPOFOL ;  Surgeon: Unk Corinn Skiff, MD;  Location: [facility];  Service: Endoscopy;  Laterality: N/A;   ESOPHAGOGASTRODUODENOSCOPY (EGD) WITH PROPOFOL  N/A 04/20/2022   Procedure: ESOPHAGOGASTRODUODENOSCOPY (EGD) WITH PROPOFOL ;  Surgeon: Unk Corinn Skiff, MD;  Location: [facility];  Service: Endoscopy;  Laterality: N/A;   EYE SURGERY     HERNIA REPAIR  10/11/2005   lumbar decompression surgery  10/11/2008    Current Outpatient Medications  Medication Sig Dispense Refill   Accu-Chek Softclix Lancets lancets USE 1 LANCET THREE TIMES A DAY 300 each 1   aspirin EC 81 MG tablet Take 81 mg by mouth daily. Swallow whole.     carvedilol  (COREG ) 25 MG tablet Take 1 tablet (25 mg total) by mouth 2 (two) times  daily. 180 tablet 1   cyanocobalamin (VITAMIN B12) 1000 MCG tablet Take 1,000 mcg by mouth daily.     gabapentin  (NEURONTIN ) 300 MG capsule Take 1 capsule (300 mg total) by mouth 3 (three) times daily. 270 capsule 1   glucose blood (ACCU-CHEK AVIVA PLUS) test strip USE 1 THREE TIMES A DAY 300 strip 1   JARDIANCE  25 MG TABS tablet Take 1 tablet (25 mg total) by mouth daily. 90 tablet 1   levothyroxine  (SYNTHROID ) 25 MCG tablet Take 1 tablet (25 mcg total) by mouth every morning. 90 tablet 1   lisinopril -hydrochlorothiazide  (ZESTORETIC ) 20-12.5 MG tablet Take 1 tablet by mouth daily. 90 tablet 1   Magnesium Oxide -Mg Supplement 250 MG TABS Take 1 tablet by mouth daily.     metFORMIN  (GLUCOPHAGE ) 1000 MG tablet Take 1 tablet (1,000 mg total) by mouth 2 (two) times daily. 180 tablet 1   omeprazole  (PRILOSEC) 20 MG capsule Take 1 capsule (20 mg total) by mouth 2 (two) times daily before a meal. 180 capsule 1   rosuvastatin  (CRESTOR ) 20 MG tablet Take 1 tablet (20 mg total) by mouth daily. 90 tablet 1   Semaglutide , 1 MG/DOSE, (OZEMPIC , 1 MG/DOSE,) 4 MG/3ML SOPN Inject 1 mg into the muscle once a week. 9 mL 1   tamsulosin  (FLOMAX ) 0.4 MG CAPS capsule Take 1 capsule (0.4 mg total) by mouth daily. 30 capsule 11   Vitamin D , Ergocalciferol , 50000 units CAPS TAKE 1 CAPSULE 1 TIME A WEEK 12 capsule 0   hydrocortisone  (ANUSOL -HC) 2.5 % rectal cream Place 1 Application rectally 2 (two) times daily. 30 g 0   No current facility-administered medications for this visit.    Allergies as of 09/03/2024   (No Known Allergies)    Family History  Problem Relation Age of Onset   Other Mother        unknown medical history   Other Father 38       auto accident    Social History   Socioeconomic History   Marital status: Married    Spouse name: Dominga   Number of children: 3   Years of education: Not on file   Highest education level: Not on file  Occupational History   Occupation: disability   Tobacco Use   Smoking status: Never   Smokeless tobacco: Never  Vaping Use   Vaping status: Never Used  Substance and Sexual Activity   Alcohol use: Not Currently   Drug use: Not Currently   Sexual activity: Not Currently  Other Topics Concern   Not on file  Social History Narrative   ** Merged History Encounter **       Social Drivers of Health   Financial Resource Strain: Medium Risk (11/15/2023)   Overall Financial Resource Strain (CARDIA)    Difficulty of Paying Living Expenses: Somewhat hard  Food Insecurity: Food Insecurity Present (11/29/2023)   Hunger Vital Sign    Worried About Running Out of Food in the Last Year: Sometimes true    Ran Out of Food in the Last Year: Sometimes true  Transportation  Needs: No Transportation Needs (11/29/2023)   PRAPARE - Administrator, Civil Service (Medical): No    Lack of Transportation (Non-Medical): No  Physical Activity: Sufficiently Active (11/15/2023)   Exercise Vital Sign    Days of Exercise per Week: 3 days    Minutes of Exercise per Session: 90 min  Stress: No Stress Concern Present (11/15/2023)   [employer] of Occupational Health - Occupational Stress Questionnaire    Feeling of Stress : Not at all  Social Connections: Moderately Isolated (11/15/2023)   Social Connection and Isolation Panel    Frequency of Communication with Friends and Family: More than three times a week    Frequency of Social Gatherings with Friends and Family: More than three times a week    Attends Religious Services: Never    Active Member of Clubs or Organizations: No    Attends Banker Meetings: Never    Marital Status: Married  Catering Manager Violence: Not At Risk (11/29/2023)   Humiliation, Afraid, Rape, and Kick questionnaire    Fear of Current or Ex-Partner: No    Emotionally Abused: No    Physically Abused: No    Sexually Abused: No     RELEVANT GI HISTORY, IMAGING AND LABS: CBC    Component Value Date/Time    WBC 5.7 03/26/2024 1001   WBC 6.2 12/05/2023 1003   WBC 6.0 02/01/2023 0923   RBC 4.53 03/26/2024 1001   RBC 5.12 12/05/2023 1003   HGB 13.9 03/26/2024 1001   HCT 43.2 03/26/2024 1001   PLT 269 03/26/2024 1001   MCV 95 03/26/2024 1001   MCV 86 01/10/2014 1056   MCH 30.7 03/26/2024 1001   MCH 30.1 12/05/2023 1003   MCHC 32.2 03/26/2024 1001   MCHC 33.8 12/05/2023 1003   RDW 13.6 03/26/2024 1001   RDW 12.8 01/10/2014 1056   LYMPHSABS 1.6 03/26/2024 1001   MONOABS 0.4 12/05/2023 1003   EOSABS 0.1 03/26/2024 1001   BASOSABS 0.0 03/26/2024 1001   Recent Labs    09/05/23 1001 09/20/23 0954 09/23/23 1104 12/05/23 1003 03/26/24 1001  HGB 14.4 14.0 13.3 15.4 13.9    CMP     Component Value Date/Time   NA 141 03/26/2024 1001   NA 134 (L) 01/10/2014 1056   K 4.3 03/26/2024 1001   K 3.5 01/10/2014 1056   CL 105 03/26/2024 1001   CL 101 01/10/2014 1056   CO2 21 03/26/2024 1001   CO2 28 01/10/2014 1056   GLUCOSE 94 03/26/2024 1001   GLUCOSE 138 (H) 04/04/2022 0704   GLUCOSE 161 (H) 01/10/2014 1056   BUN 12 03/26/2024 1001   BUN 9 01/10/2014 1056   CREATININE 0.82 03/26/2024 1001   CREATININE 0.82 01/10/2014 1056   CALCIUM  9.2 03/26/2024 1001   CALCIUM  8.5 01/10/2014 1056   PROT 6.8 03/26/2024 1001   PROT 7.9 01/10/2014 1056   ALBUMIN 4.5 03/26/2024 1001   ALBUMIN 3.7 01/10/2014 1056   AST 15 03/26/2024 1001   AST 27 01/10/2014 1056   ALT 18 03/26/2024 1001   ALT 42 01/10/2014 1056   ALKPHOS 62 03/26/2024 1001   ALKPHOS 98 01/10/2014 1056   BILITOT 0.3 03/26/2024 1001   BILITOT 0.4 01/10/2014 1056   GFRNONAA >60 04/04/2022 0704   GFRNONAA >60 01/10/2014 1056   GFRAA >60 12/09/2019 0730   GFRAA >60 01/10/2014 1056      Latest Ref Rng & Units 03/26/2024   10:01 AM 09/23/2023   11:04 AM  06/24/2023   10:33 AM  Hepatic Function  Total Protein 6.0 - 8.5 g/dL 6.8  7.0  7.2   Albumin 3.9 - 4.9 g/dL 4.5  4.4  4.5   AST 0 - 40 IU/L 15  18  18    ALT 0 - 44 IU/L 18   18  18    Alk Phosphatase 44 - 121 IU/L 62  66  68   Total Bilirubin 0.0 - 1.2 mg/dL 0.3  0.3  0.4       Review of Systems   All systems reviewed and negative except where noted in HPI.    Physical Exam  BP 120/74   Pulse 66   Temp 98 F (36.7 C)   Ht 5' 1.25 (1.556 m)   Wt 186 lb (84.4 kg)   SpO2 97%   BMI 34.86 kg/m  No LMP for male patient. General:   Alert and oriented. Pleasant and cooperative. Well-nourished and well-developed.  In no acute distress.  Head:  Normocephalic and atraumatic. Eyes:  Without icterus Ears:  Normal auditory acuity. Neck:  Supple; no masses or thyromegaly. Lungs:  Respirations even and unlabored.  Clear throughout to auscultation.   No wheezes, crackles, or rhonchi. No acute distress. Heart:  Regular rate and rhythm; no murmurs, clicks, rubs, or gallops. Abdomen:  Normal bowel sounds.  No bruits.  Soft, mild TTP RUQ, non-distended without masses, hepatosplenomegaly or hernias noted.  No guarding or rebound tenderness.  Rectal:  Deferred. Msk:  Symmetrical without gross deformities. Normal posture. Extremities:  Without edema. Neurologic:  Alert and  oriented x4;  grossly normal neurologically. Skin:  Intact without significant lesions or rashes. Psych:  Alert and cooperative. Normal mood and affect.   Assessment & Plan   Psalm Baltazar Preston is a 63 y.o. male presenting today with rectal bleeding.   Rectal bleeding.  History of internal and external hemorrhoids, last band ligation 05/2022.  Denies rectal pain. - Sent in refill for Anusol  rectal cream - Will check CBC - Increase water  and fiber if having to strain, and can take stool softener if needed. Not currently having hard stools.  - Plan for repeat hemorrhoid banding with Dr. Melany.  Patient will be called once her schedule is open for December.    Grayce Bohr, DNP, AGNP-C [facility]

## 2024-09-03 NOTE — Telephone Encounter (Signed)
 Reviewed patient's chart. Medications sent to [facility] last month. Called and confirmed with the pharmacy that medications were received and that they are being filled for the patient.   Called and notified patient of the above information.

## 2024-09-04 ENCOUNTER — Ambulatory Visit: Payer: Self-pay | Admitting: Family Medicine

## 2024-09-04 LAB — CBC WITH DIFFERENTIAL/PLATELET
Basophils Absolute: 0 x10E3/uL (ref 0.0–0.2)
Basos: 0 %
EOS (ABSOLUTE): 0.1 x10E3/uL (ref 0.0–0.4)
Eos: 2 %
Hematocrit: 45.7 % (ref 37.5–51.0)
Hemoglobin: 15 g/dL (ref 13.0–17.7)
Immature Grans (Abs): 0 x10E3/uL (ref 0.0–0.1)
Immature Granulocytes: 0 %
Lymphocytes Absolute: 2.1 x10E3/uL (ref 0.7–3.1)
Lymphs: 29 %
MCH: 30.6 pg (ref 26.6–33.0)
MCHC: 32.8 g/dL (ref 31.5–35.7)
MCV: 93 fL (ref 79–97)
Monocytes Absolute: 0.5 x10E3/uL (ref 0.1–0.9)
Monocytes: 7 %
Neutrophils Absolute: 4.4 x10E3/uL (ref 1.4–7.0)
Neutrophils: 62 %
Platelets: 326 x10E3/uL (ref 150–450)
RBC: 4.9 x10E6/uL (ref 4.14–5.80)
RDW: 13.4 % (ref 11.6–15.4)
WBC: 7.1 x10E3/uL (ref 3.4–10.8)

## 2024-09-05 ENCOUNTER — Telehealth: Payer: Self-pay

## 2024-09-05 NOTE — Telephone Encounter (Signed)
 Patient notified.  CBC WNL   Dorothe, please call patient and let him know that his CBC is normal, rectal bleeding has not caused a drop in his hemoglobin.  We are still planning to get him scheduled with Dr. Melany for hemorrhoid banding next month.   Thanks   Grayce Bohr, DNP, AGNP-C

## 2024-09-25 ENCOUNTER — Ambulatory Visit (INDEPENDENT_AMBULATORY_CARE_PROVIDER_SITE_OTHER): Admitting: Nurse Practitioner

## 2024-09-25 ENCOUNTER — Encounter: Payer: Self-pay | Admitting: Nurse Practitioner

## 2024-09-25 VITALS — BP 126/80 | HR 60 | Temp 98.0°F | Ht 61.26 in

## 2024-09-25 DIAGNOSIS — E782 Mixed hyperlipidemia: Secondary | ICD-10-CM

## 2024-09-25 DIAGNOSIS — Z7985 Long-term (current) use of injectable non-insulin antidiabetic drugs: Secondary | ICD-10-CM

## 2024-09-25 DIAGNOSIS — E1140 Type 2 diabetes mellitus with diabetic neuropathy, unspecified: Secondary | ICD-10-CM

## 2024-09-25 DIAGNOSIS — Z23 Encounter for immunization: Secondary | ICD-10-CM

## 2024-09-25 DIAGNOSIS — I10 Essential (primary) hypertension: Secondary | ICD-10-CM

## 2024-09-25 DIAGNOSIS — K219 Gastro-esophageal reflux disease without esophagitis: Secondary | ICD-10-CM

## 2024-09-25 DIAGNOSIS — E039 Hypothyroidism, unspecified: Secondary | ICD-10-CM

## 2024-09-25 DIAGNOSIS — E119 Type 2 diabetes mellitus without complications: Secondary | ICD-10-CM

## 2024-09-25 DIAGNOSIS — D509 Iron deficiency anemia, unspecified: Secondary | ICD-10-CM

## 2024-09-25 LAB — MICROALBUMIN, URINE WAIVED
Creatinine, Urine Waived: 50 mg/dL (ref 10–300)
Microalb, Ur Waived: 10 mg/L (ref 0–19)

## 2024-09-25 MED ORDER — LISINOPRIL-HYDROCHLOROTHIAZIDE 20-12.5 MG PO TABS: 1.0000 | ORAL_TABLET | Freq: Every day | ORAL | 1 refills | Status: AC

## 2024-09-25 MED ORDER — METFORMIN HCL 1000 MG PO TABS: 1000.0000 mg | ORAL_TABLET | Freq: Two times a day (BID) | ORAL | 1 refills | Status: AC

## 2024-09-25 MED ORDER — ROSUVASTATIN CALCIUM 20 MG PO TABS: 20.0000 mg | ORAL_TABLET | Freq: Every day | ORAL | 1 refills | Status: AC

## 2024-09-25 MED ORDER — OMEPRAZOLE 20 MG PO CPDR: 20.0000 mg | DELAYED_RELEASE_CAPSULE | Freq: Two times a day (BID) | ORAL | 1 refills | Status: AC

## 2024-09-25 MED ORDER — GABAPENTIN 300 MG PO CAPS: 300.0000 mg | ORAL_CAPSULE | Freq: Three times a day (TID) | ORAL | 1 refills | Status: AC

## 2024-09-25 MED ORDER — JARDIANCE 25 MG PO TABS: 25.0000 mg | ORAL_TABLET | Freq: Every day | ORAL | 1 refills | Status: AC

## 2024-09-25 MED ORDER — LEVOTHYROXINE SODIUM 25 MCG PO TABS: 25.0000 ug | ORAL_TABLET | Freq: Every morning | ORAL | 1 refills | Status: AC

## 2024-09-25 MED ORDER — CARVEDILOL 25 MG PO TABS: 25.0000 mg | ORAL_TABLET | Freq: Two times a day (BID) | ORAL | 1 refills | Status: AC

## 2024-09-25 MED ORDER — OZEMPIC (1 MG/DOSE) 4 MG/3ML ~~LOC~~ SOPN: 1.0000 mg | PEN_INJECTOR | SUBCUTANEOUS | 1 refills | Status: AC

## 2024-09-25 NOTE — Assessment & Plan Note (Signed)
 Chronic.  Controlled.  Continue with current medication regimen.  Followed by Dr Babara.  He is experiencing rectal bleeding.  Has seen GI and Heme.  Per GI note- will have hemorrhoid banding on 12/18.  Labs ordered today.  Return to clinic in 6 months for reevaluation.  Call sooner if concerns arise.

## 2024-09-25 NOTE — Assessment & Plan Note (Signed)
 Chronic, controlled. Continue on current regimen comprised of Carvedilol  25 mg PO every day, Lisinopril - hydrochlorothiazide  20-12.5 mg PO every day  Refills provided today  Recommend checking blood pressures at home and bring log to next visit Follow up in 6 months or sooner if concerns arise

## 2024-09-25 NOTE — Assessment & Plan Note (Signed)
 Chronic.  Controlled.  Continue with current medication regimen of levothyroxine  25mcg.  Labs ordered today.  Return to clinic in 6 months for reevaluation.  Call sooner if concerns arise.

## 2024-09-25 NOTE — Assessment & Plan Note (Signed)
 Chronic.  Controlled.  Continue with current medication regimen.  Labs ordered today.  Return to clinic in 6 months for reevaluation.  Call sooner if concerns arise.  ? ?

## 2024-09-25 NOTE — Assessment & Plan Note (Signed)
 Chronic, controlled.  Most recent A1c was 6.2- recheck today. Results to dictate further management  Currently taking Ozempic  1 mg weekly injection, metformin  1000 mg PO BID, Jardiance  25 mg PO every day Reports fasting is usually  115-135 Continue current regimen for now, refills provided Follow up in 6 months or sooner if concerns arise

## 2024-09-25 NOTE — Progress Notes (Signed)
 BP 126/80 (BP Location: Right Arm, Patient Position: Sitting, Cuff Size: Large)   Pulse 60   Temp 98 F (36.7 C) (Oral)   Ht 5' 1.26 (1.556 m)   SpO2 99%   BMI 34.85 kg/m    Subjective:    Patient ID: Shane Preston, male    DOB: 1961/03/10, 63 y.o.   MRN: [id number]  HPI: Shane Preston is a 63 y.o. male  Chief Complaint  Patient presents with   Hypertension   Hyperlipidemia    6 month F/u   Hand Pain    Patient stated two fingers on his right hand is completely numb with no sensation for 3 weeks. Also, he has rectal bleeding which he has a cream for.    HYPERTENSION / HYPERLIPIDEMIA Satisfied with current treatment? yes Duration of hypertension: years BP monitoring frequency: not checking BP range:  BP medication side effects: no Past BP meds: carvdeilol and lisinopril -HCTZ Duration of hyperlipidemia: years Cholesterol medication side effects: no Cholesterol supplements: none Past cholesterol medications: rosuvastatin  (crestor ) Medication compliance: excellent compliance Aspirin: no Recent stressors: no Recurrent headaches: no Visual changes: no Palpitations: no Dyspnea: no Chest pain: no Lower extremity edema: no Dizzy/lightheaded: no  DIABETES Last A1c was 6.2% in June.  Jardiance , Metformin  1000mg  and Ozempic  1mg .   Hypoglycemic episodes:no Polydipsia/polyuria: no Visual disturbance: no Chest pain: no Paresthesias: no Glucose Monitoring: yes  Accucheck frequency: daily  Fasting glucose: 115-135  Post prandial:  Evening:  Before meals: Taking Insulin ?: no  Long acting insulin :  Short acting insulin : Blood Pressure Monitoring: daily Retinal Examination: Up to Date Foot Exam: Up to Date Diabetic Education: Not Completed Pneumovax: Up to Date Influenza: Up to Date Aspirin: no  ANEMIA Anemia status: controlled Etiology of anemia: Duration of anemia treatment:  Compliance with treatment: excellent compliance Iron   supplementation side effects: no Severity of anemia: severe Fatigue: no Decreased exercise tolerance: no  Dyspnea on exertion: no Palpitations: no Bleeding: no Pica: no  HYPOTHYROIDISM Thyroid  control status:controlled Satisfied with current treatment? no Medication side effects: no Medication compliance: excellent compliance Etiology of hypothyroidism:  Recent dose adjustment:no Fatigue: no Cold intolerance: no Heat intolerance: no Weight gain: no Weight loss: no Constipation: no Diarrhea/loose stools: no Palpitations: no Lower extremity edema: no Anxiety/depressed mood: no  Patient states he is having numbness and tingling in his right hand pinky, ring finger and middle finger.   Patient states he has been having a lot of rectal bleeding for 3 weeks.  He was seen at the cancer center and GI.  His hemoglobin has been stable.  He will be seen again by GI on 12/18. He has hemorrhoid banding in the past and plan for additional banding this month.   Relevant past medical, surgical, family and social history reviewed and updated as indicated. Interim medical history since our last visit reviewed. Allergies and medications reviewed and updated.  Review of Systems  Constitutional:  Negative for fatigue.  Eyes:  Negative for visual disturbance.  Respiratory:  Negative for chest tightness and shortness of breath.   Cardiovascular:  Negative for chest pain, palpitations and leg swelling.  Gastrointestinal:  Positive for blood in stool.  Endocrine: Negative for polydipsia and polyuria.  Neurological:  Negative for dizziness, light-headedness, numbness and headaches.    Per HPI unless specifically indicated above     Objective:    BP 126/80 (BP Location: Right Arm, Patient Position: Sitting, Cuff Size: Large)   Pulse 60   Temp 98 F (  36.7 C) (Oral)   Ht 5' 1.26 (1.556 m)   SpO2 99%   BMI 34.85 kg/m   Wt Readings from Last 3 Encounters:  09/03/24 186 lb (84.4 kg)   03/26/24 185 lb (83.9 kg)  12/08/23 182 lb 14.4 oz (83 kg)    Physical Exam Vitals and nursing note reviewed.  Constitutional:      General: He is not in acute distress.    Appearance: Normal appearance. He is not ill-appearing, toxic-appearing or diaphoretic.  HENT:     Head: Normocephalic.     Right Ear: External ear normal.     Left Ear: External ear normal.     Nose: Nose normal. No congestion or rhinorrhea.     Mouth/Throat:     Mouth: Mucous membranes are moist.  Eyes:     General:        Right eye: No discharge.        Left eye: No discharge.     Extraocular Movements: Extraocular movements intact.     Conjunctiva/sclera: Conjunctivae normal.     Pupils: Pupils are equal, round, and reactive to light.  Cardiovascular:     Rate and Rhythm: Normal rate and regular rhythm.     Heart sounds: No murmur heard. Pulmonary:     Effort: Pulmonary effort is normal. No respiratory distress.     Breath sounds: Normal breath sounds. No wheezing, rhonchi or rales.  Abdominal:     General: Abdomen is flat. Bowel sounds are normal.  Musculoskeletal:     Cervical back: Normal range of motion and neck supple.  Skin:    General: Skin is warm and dry.     Capillary Refill: Capillary refill takes less than 2 seconds.  Neurological:     General: No focal deficit present.     Mental Status: He is alert and oriented to person, place, and time.  Psychiatric:        Mood and Affect: Mood normal.        Behavior: Behavior normal.        Thought Content: Thought content normal.        Judgment: Judgment normal.     Results for orders placed or performed in visit on 09/03/24  CBC with Differential/Platelet   Collection Time: 09/03/24  1:39 PM  Result Value Ref Range   WBC 7.1 3.4 - 10.8 x10E3/uL   RBC 4.90 4.14 - 5.80 x10E6/uL   Hemoglobin 15.0 13.0 - 17.7 g/dL   Hematocrit 54.2 62.4 - 51.0 %   MCV 93 79 - 97 fL   MCH 30.6 26.6 - 33.0 pg   MCHC 32.8 31.5 - 35.7 g/dL   RDW 86.5  88.3 - 84.5 %   Platelets 326 150 - 450 x10E3/uL   Neutrophils 62 Not Estab. %   Lymphs 29 Not Estab. %   Monocytes 7 Not Estab. %   Eos 2 Not Estab. %   Basos 0 Not Estab. %   Neutrophils Absolute 4.4 1.4 - 7.0 x10E3/uL   Lymphocytes Absolute 2.1 0.7 - 3.1 x10E3/uL   Monocytes Absolute 0.5 0.1 - 0.9 x10E3/uL   EOS (ABSOLUTE) 0.1 0.0 - 0.4 x10E3/uL   Basophils Absolute 0.0 0.0 - 0.2 x10E3/uL   Immature Granulocytes 0 Not Estab. %   Immature Grans (Abs) 0.0 0.0 - 0.1 x10E3/uL      Assessment & Plan:   Problem List Items Addressed This Visit       Cardiovascular and Mediastinum   HTN (  hypertension) (Chronic)   Chronic, controlled. Continue on current regimen comprised of Carvedilol  25 mg PO every day, Lisinopril - hydrochlorothiazide  20-12.5 mg PO every day  Refills provided today  Recommend checking blood pressures at home and bring log to next visit Follow up in 6 months or sooner if concerns arise       Relevant Medications   carvedilol  (COREG ) 25 MG tablet   lisinopril -hydrochlorothiazide  (ZESTORETIC ) 20-12.5 MG tablet   rosuvastatin  (CRESTOR ) 20 MG tablet     Digestive   Gastroesophageal reflux disease   Chronic.  Controlled.  Continue with current medication regimen.  Labs ordered today.  Return to clinic in 6 months for reevaluation.  Call sooner if concerns arise.       Relevant Medications   omeprazole  (PRILOSEC) 20 MG capsule     Endocrine   Acquired hypothyroidism   Chronic.  Controlled.  Continue with current medication regimen of levothyroxine  25mcg.  Labs ordered today.  Return to clinic in 6 months for reevaluation.  Call sooner if concerns arise.       Relevant Medications   carvedilol  (COREG ) 25 MG tablet   levothyroxine  (SYNTHROID ) 25 MCG tablet   Other Relevant Orders   TSH   T4   Type 2 diabetes mellitus with diabetic neuropathy, unspecified (HCC) - Primary   Chronic, controlled.  Most recent A1c was 6.2- recheck today. Results to dictate  further management  Currently taking Ozempic  1 mg weekly injection, metformin  1000 mg PO BID, Jardiance  25 mg PO every day Reports fasting is usually  115-135 Continue current regimen for now, refills provided Follow up in 6 months or sooner if concerns arise        Relevant Medications   JARDIANCE  25 MG TABS tablet   lisinopril -hydrochlorothiazide  (ZESTORETIC ) 20-12.5 MG tablet   metFORMIN  (GLUCOPHAGE ) 1000 MG tablet   rosuvastatin  (CRESTOR ) 20 MG tablet   Semaglutide , 1 MG/DOSE, (OZEMPIC , 1 MG/DOSE,) 4 MG/3ML SOPN   Other Relevant Orders   Comprehensive metabolic panel with GFR   Hemoglobin A1c   Microalbumin, Urine Waived   Diabetes mellitus treated with injections of non-insulin  medication (HCC)   Relevant Medications   gabapentin  (NEURONTIN ) 300 MG capsule   JARDIANCE  25 MG TABS tablet   lisinopril -hydrochlorothiazide  (ZESTORETIC ) 20-12.5 MG tablet   metFORMIN  (GLUCOPHAGE ) 1000 MG tablet   rosuvastatin  (CRESTOR ) 20 MG tablet   Semaglutide , 1 MG/DOSE, (OZEMPIC , 1 MG/DOSE,) 4 MG/3ML SOPN     Other   Iron  deficiency anemia (Chronic)   Chronic.  Controlled.  Continue with current medication regimen.  Followed by Dr Babara.  He is experiencing rectal bleeding.  Has seen GI and Heme.  Per GI note- will have hemorrhoid banding on 12/18.  Labs ordered today.  Return to clinic in 6 months for reevaluation.  Call sooner if concerns arise.       Relevant Orders   CBC With Diff/Platelet   Iron  Binding Cap (TIBC)([facility])   Mixed hyperlipidemia   Chronic, controlled. Appears stable and well managed with Rosuvastatin  20 mg PO QD  Continue current medications pending lab results Recheck lipid panel today - results to dictate further management Recommend he continues with walking and makes dietary changes to assist with diabetes and cholesterol Follow up in 6 months or sooner if concerns arise       Relevant Medications   carvedilol  (COREG ) 25 MG tablet    lisinopril -hydrochlorothiazide  (ZESTORETIC ) 20-12.5 MG tablet   rosuvastatin  (CRESTOR ) 20 MG tablet   Other Relevant Orders   Lipid  panel   Other Visit Diagnoses       Anemia, unspecified type       Relevant Medications   rosuvastatin  (CRESTOR ) 20 MG tablet     Need for vaccination against Streptococcus pneumoniae       Relevant Orders   Pneumococcal conjugate vaccine 20-valent (Prevnar 20) (Completed)        Follow up plan: Return in about 6 months (around 03/26/2025) for Physical and Fasting labs.

## 2024-09-25 NOTE — Assessment & Plan Note (Signed)
 Chronic, controlled. Appears stable and well managed with Rosuvastatin 20 mg PO QD  Continue current medications pending lab results Recheck lipid panel today - results to dictate further management Recommend he continues with walking and makes dietary changes to assist with diabetes and cholesterol Follow up in 6 months or sooner if concerns arise

## 2024-09-26 ENCOUNTER — Ambulatory Visit: Payer: Self-pay | Admitting: Nurse Practitioner

## 2024-09-26 LAB — CBC WITH DIFF/PLATELET
Basophils Absolute: 0 x10E3/uL (ref 0.0–0.2)
Basos: 1 %
EOS (ABSOLUTE): 0.2 x10E3/uL (ref 0.0–0.4)
Eos: 3 %
Hematocrit: 33.2 % — ABNORMAL LOW (ref 37.5–51.0)
Hemoglobin: 10.6 g/dL — ABNORMAL LOW (ref 13.0–17.7)
Immature Grans (Abs): 0 x10E3/uL (ref 0.0–0.1)
Immature Granulocytes: 0 %
Lymphocytes Absolute: 1.8 x10E3/uL (ref 0.7–3.1)
Lymphs: 29 %
MCH: 30.4 pg (ref 26.6–33.0)
MCHC: 31.9 g/dL (ref 31.5–35.7)
MCV: 95 fL (ref 79–97)
Monocytes Absolute: 0.4 x10E3/uL (ref 0.1–0.9)
Monocytes: 7 %
Neutrophils Absolute: 3.8 x10E3/uL (ref 1.4–7.0)
Neutrophils: 59 %
Platelets: 330 x10E3/uL (ref 150–450)
RBC: 3.49 x10E6/uL — ABNORMAL LOW (ref 4.14–5.80)
RDW: 13.8 % (ref 11.6–15.4)
WBC: 6.3 x10E3/uL (ref 3.4–10.8)

## 2024-09-26 LAB — COMPREHENSIVE METABOLIC PANEL WITH GFR
ALT: 21 IU/L (ref 0–44)
AST: 16 IU/L (ref 0–40)
Albumin: 4.5 g/dL (ref 3.9–4.9)
Alkaline Phosphatase: 56 IU/L (ref 47–123)
BUN/Creatinine Ratio: 8 — ABNORMAL LOW (ref 10–24)
BUN: 7 mg/dL — ABNORMAL LOW (ref 8–27)
Bilirubin Total: 0.3 mg/dL (ref 0.0–1.2)
CO2: 25 mmol/L (ref 20–29)
Calcium: 9.3 mg/dL (ref 8.6–10.2)
Chloride: 103 mmol/L (ref 96–106)
Creatinine, Ser: 0.92 mg/dL (ref 0.76–1.27)
Globulin, Total: 2.4 g/dL (ref 1.5–4.5)
Glucose: 98 mg/dL (ref 70–99)
Potassium: 4.7 mmol/L (ref 3.5–5.2)
Sodium: 139 mmol/L (ref 134–144)
Total Protein: 6.9 g/dL (ref 6.0–8.5)
eGFR: 93 mL/min/1.73 (ref >59)

## 2024-09-26 LAB — LIPID PANEL
Chol/HDL Ratio: 2.6 ratio (ref 0.0–5.0)
Cholesterol, Total: 118 mg/dL (ref 100–199)
HDL: 46 mg/dL (ref >39)
LDL Chol Calc (NIH): 45 mg/dL (ref 0–99)
Triglycerides: 162 mg/dL — ABNORMAL HIGH (ref 0–149)
VLDL Cholesterol Cal: 27 mg/dL (ref 5–40)

## 2024-09-26 LAB — IRON AND TIBC
Iron Saturation: 14 % — ABNORMAL LOW (ref 15–55)
Iron: 53 ug/dL (ref 38–169)
Total Iron Binding Capacity: 380 ug/dL (ref 250–450)
UIBC: 327 ug/dL (ref 111–343)

## 2024-09-26 LAB — HEMOGLOBIN A1C
Est. average glucose Bld gHb Est-mCnc: 140 mg/dL
Hgb A1c MFr Bld: 6.5 % — ABNORMAL HIGH (ref 4.8–5.6)

## 2024-09-26 LAB — TSH: TSH: 4.15 u[IU]/mL (ref 0.450–4.500)

## 2024-09-26 LAB — T4: T4, Total: 7.9 ug/dL (ref 4.5–12.0)

## 2024-09-27 ENCOUNTER — Ambulatory Visit: Admitting: Gastroenterology

## 2024-10-13 ENCOUNTER — Other Ambulatory Visit: Payer: Self-pay | Admitting: Nurse Practitioner

## 2024-10-15 NOTE — Telephone Encounter (Signed)
 Requested Prescriptions  Pending Prescriptions Disp Refills   ACCU-CHEK AVIVA PLUS test strip [Pharmacy Med Name: ACCU-CHEK AVIVA PLUS TEST STRP] 100 strip 0    Sig: USE 1 THREE TIMES A DAY     Endocrinology: Diabetes - Testing Supplies Passed - 10/15/2024  3:32 PM      Passed - Valid encounter within last 12 months    Recent Outpatient Visits           2 weeks ago Type 2 diabetes mellitus with diabetic neuropathy, without long-term current use of insulin  (HCC)   [facility] Melvin Pao, NP   6 months ago Annual physical exam   [place] [facility] Melvin Pao, NP       Future Appointments             In 2 weeks Francisca, Redell BROCKS, MD [facility] [place]   In 3 weeks Melany Clotilda HERO, MD [facility] [place] [facility]

## 2024-10-30 ENCOUNTER — Ambulatory Visit: Admitting: Urology

## 2024-10-31 ENCOUNTER — Ambulatory Visit: Payer: Self-pay | Admitting: Urology

## 2024-11-05 ENCOUNTER — Other Ambulatory Visit

## 2024-11-06 ENCOUNTER — Other Ambulatory Visit

## 2024-11-06 ENCOUNTER — Other Ambulatory Visit: Payer: Self-pay

## 2024-11-06 DIAGNOSIS — Z125 Encounter for screening for malignant neoplasm of prostate: Secondary | ICD-10-CM

## 2024-11-07 ENCOUNTER — Ambulatory Visit

## 2024-11-07 DIAGNOSIS — N401 Enlarged prostate with lower urinary tract symptoms: Secondary | ICD-10-CM

## 2024-11-07 LAB — PSA: Prostate Specific Ag, Serum: 0.8 ng/mL (ref 0.0–4.0)

## 2024-11-08 ENCOUNTER — Encounter: Payer: Self-pay | Admitting: Oncology

## 2024-11-08 ENCOUNTER — Ambulatory Visit (INDEPENDENT_AMBULATORY_CARE_PROVIDER_SITE_OTHER): Admitting: Gastroenterology

## 2024-11-08 ENCOUNTER — Encounter: Payer: Self-pay | Admitting: Gastroenterology

## 2024-11-08 VITALS — BP 116/70 | HR 62 | Temp 98.5°F | Ht 61.25 in | Wt 191.0 lb

## 2024-11-08 DIAGNOSIS — K625 Hemorrhage of anus and rectum: Secondary | ICD-10-CM

## 2024-11-08 DIAGNOSIS — Z8619 Personal history of other infectious and parasitic diseases: Secondary | ICD-10-CM | POA: Diagnosis not present

## 2024-11-08 DIAGNOSIS — K640 First degree hemorrhoids: Secondary | ICD-10-CM

## 2024-11-08 NOTE — Progress Notes (Signed)
 "   Gastroenterology Consultation  Referring Provider:     Melvin Pao, NP Primary Care Physician:  Melvin Pao, NP Primary Gastroenterologist:  Dr. Melany     Reason for Consultation:     Rectal bleeding         HPI:   Shane Preston is a 64 y.o. y/o male referred for consultation & management of rectal bleeding by Dr. Melvin Pao, NP.   He was seen on 11/24 by Grayce Bohr for blood in his stool.  He was prescribed Anusol  rectal cream and was recommended to begin fiber.  He was referred here for hemorrhoid banding, but today says he has not blood since October 05, 2024. He denies pain with defecation.  Stools are not hard.  Last colonoscopy was in 2023, negative for polyps.  It was performed for rectal bleeding.  He had undergone 1 hemorrhoid banding session at that time.  There is no family history of colon cancer.  Of note, EGD on 04/20/2022 showed the presence of H. pylori on biopsies.  He does not recall being treated.     Past Medical History:  Diagnosis Date   Anemia    Cataract 03/25/2021   Claudication, intermittent 01/16/2013   Colon cancer screening 07/12/2014   Coronary artery disease    Diabetes mellitus without complication (HCC)    Hemorrhoids    Hyperlipidemia    Hypertension    IDDM (insulin  dependent diabetes mellitus) 02/06/2014   Lumbar stenosis    Neuropathy    Peripheral vascular disease    Screening examination for sexually transmitted disease 05/05/2021   Spinal stenosis, lumbar 01/17/2009   TIA (transient ischemic attack)    Tinea pedis    Well controlled type 2 diabetes mellitus (HCC) 04/03/2008    Past Surgical History:  Procedure Laterality Date   BIOPSY N/A 04/20/2022   Procedure: BIOPSY;  Surgeon: Unk Corinn Skiff, MD;  Location: [facility];  Service: Endoscopy;  Laterality: N/A;   CARDIAC CATHETERIZATION     [facility]    COLONOSCOPY WITH PROPOFOL  N/A 10/30/2021   Procedure: COLONOSCOPY WITH  PROPOFOL ;  Surgeon: Unk Corinn Skiff, MD;  Location: [facility];  Service: Gastroenterology;  Laterality: N/A;   COLONOSCOPY WITH PROPOFOL  N/A 04/20/2022   Procedure: COLONOSCOPY WITH PROPOFOL ;  Surgeon: Unk Corinn Skiff, MD;  Location: [facility];  Service: Endoscopy;  Laterality: N/A;   ESOPHAGOGASTRODUODENOSCOPY (EGD) WITH PROPOFOL  N/A 04/20/2022   Procedure: ESOPHAGOGASTRODUODENOSCOPY (EGD) WITH PROPOFOL ;  Surgeon: Unk Corinn Skiff, MD;  Location: [facility];  Service: Endoscopy;  Laterality: N/A;   EYE SURGERY     HERNIA REPAIR  10/11/2005   lumbar decompression surgery  10/11/2008    Prior to Admission medications  Medication Sig Start Date End Date Taking? Authorizing Provider  ACCU-CHEK AVIVA PLUS test strip USE 1 THREE TIMES A DAY 10/15/24  Yes Melvin Pao, NP  Accu-Chek Softclix Lancets lancets USE 1 LANCET THREE TIMES A DAY 07/24/24  Yes Melvin Pao, NP  carvedilol  (COREG ) 25 MG tablet Take 1 tablet (25 mg total) by mouth 2 (two) times daily. 09/25/24  Yes Melvin Pao, NP  cyanocobalamin (VITAMIN B12) 1000 MCG tablet Take 1,000 mcg by mouth daily.   Yes [provider]  gabapentin  (NEURONTIN ) 300 MG capsule Take 1 capsule (300 mg total) by mouth 3 (three) times daily. 09/25/24  Yes Melvin Pao, NP  hydrocortisone  (ANUSOL -HC) 2.5 % rectal cream Place 1 Application rectally 2 (two) times daily. 09/03/24  Yes Bohr Grayce,  NP  JARDIANCE  25 MG TABS tablet Take 1 tablet (25 mg total) by mouth daily. 09/25/24  Yes Melvin Pao, NP  levothyroxine  (SYNTHROID ) 25 MCG tablet Take 1 tablet (25 mcg total) by mouth every morning. 09/25/24  Yes Melvin Pao, NP  lisinopril -hydrochlorothiazide  (ZESTORETIC ) 20-12.5 MG tablet Take 1 tablet by mouth daily. 09/25/24  Yes Melvin Pao, NP  Magnesium Oxide -Mg Supplement 250 MG TABS Take 1 tablet by mouth daily. 01/28/21  Yes [provider]  metFORMIN  (GLUCOPHAGE ) 1000  MG tablet Take 1 tablet (1,000 mg total) by mouth 2 (two) times daily. 09/25/24  Yes Melvin Pao, NP  omeprazole  (PRILOSEC) 20 MG capsule Take 1 capsule (20 mg total) by mouth 2 (two) times daily before a meal. 09/25/24  Yes Melvin Pao, NP  rosuvastatin  (CRESTOR ) 20 MG tablet Take 1 tablet (20 mg total) by mouth daily. 09/25/24  Yes Melvin Pao, NP  Semaglutide , 1 MG/DOSE, (OZEMPIC , 1 MG/DOSE,) 4 MG/3ML SOPN Inject 1 mg into the muscle once a week. 09/25/24  Yes Melvin Pao, NP  tamsulosin  (FLOMAX ) 0.4 MG CAPS capsule Take 1 capsule (0.4 mg total) by mouth daily. 11/01/23  Yes Francisca Redell BROCKS, MD  Vitamin D , Ergocalciferol , 50000 units CAPS TAKE 1 CAPSULE 1 TIME A WEEK 06/21/23  Yes Melvin Pao, NP    Family History  Problem Relation Age of Onset   Other Mother        unknown medical history   Other Father 69       auto accident     Social History[1]  Allergies as of 11/08/2024   (No Known Allergies)    Review of Systems:    All systems reviewed and negative except where noted in HPI.   Physical Exam:  There were no vitals taken for this visit. No LMP for male patient. General:   Alert,  Well-developed, well-nourished, pleasant and cooperative in NAD Head:  Normocephalic and atraumatic. Eyes:  Sclera clear, no icterus.   Conjunctiva pink. Ears:  Normal auditory acuity. Neck:  Supple; no masses or thyromegaly. Lungs:  Respirations even and unlabored.  Clear throughout to auscultation.   No wheezes, crackles, or rhonchi. No acute distress. Heart:  Regular rate and rhythm; no murmurs, clicks, rubs, or gallops. Abdomen:  Normal bowel sounds.  No bruits.  Soft, non-tender and non-distended without masses, hepatosplenomegaly or hernias noted.  No guarding or rebound tenderness.  Negative Carnett sign.   Rectal:  Deferred.  Pulses:  Normal pulses noted. Extremities:  No clubbing or edema.  No cyanosis. Neurologic:  Alert and oriented x3;  grossly normal  neurologically. Skin:  Intact without significant lesions or rashes.  No jaundice. Lymph Nodes:  No significant cervical adenopathy. Psych:  Alert and cooperative. Normal mood and affect.  Imaging Studies: No results found.  Assessment and Plan:   Shane Preston is a 64 y.o. y/o male with intermittent bright red blood per rectum.  He is up-to-date on colonoscopy.  There is no family history of colon cancer.  The bleeding pattern is consistent with hemorrhoids.  We discussed the options of updating the colonoscopy, which I do not think is necessarily indicated, versus banding today in anticipation of repeated bleeding, versus initiation of Metamucil. He opted for initiation of Metamucil, and will call if bleeding returns, at which time a banding series will be initiated.  He does not recall being treated for H. pylori.  We will update his testing with the stool antigen today (he takes omeprazole ).  If  positive, I will treat him with Pylera.    Clotilda Schaffer, MD   Note: This dictation was prepared with Dragon dictation along with smaller phrase technology. Any transcriptional errors that result from this process are unintentional.       [1]  Social History Tobacco Use   Smoking status: Never   Smokeless tobacco: Never  Vaping Use   Vaping status: Never Used  Substance Use Topics   Alcohol use: Not Currently   Drug use: Not Currently   "

## 2024-11-08 NOTE — Patient Instructions (Addendum)
 Adquiera este suplemento de [employer] favor, llmenos para informarnos de los [employer]erkley de probar el suplemento de [place].  Le llamar cuando reciba los resultados del anlisis de [place].

## 2024-11-11 LAB — H. PYLORI ANTIGEN, STOOL: H pylori Ag, Stl: NEGATIVE

## 2025-03-28 ENCOUNTER — Encounter: Admitting: Nurse Practitioner
